# Patient Record
Sex: Male | Born: 1985
Health system: Southern US, Community
[De-identification: ages and names within clinical notes are randomized; demographics above are authoritative.]

## PROBLEM LIST (undated history)

## (undated) DIAGNOSIS — J40 Bronchitis, not specified as acute or chronic: Secondary | ICD-10-CM

## (undated) DIAGNOSIS — F419 Anxiety disorder, unspecified: Secondary | ICD-10-CM

## (undated) DIAGNOSIS — F32A Depression, unspecified: Secondary | ICD-10-CM

## (undated) DIAGNOSIS — C801 Malignant (primary) neoplasm, unspecified: Secondary | ICD-10-CM

## (undated) DIAGNOSIS — D649 Anemia, unspecified: Secondary | ICD-10-CM

## (undated) DIAGNOSIS — T7840XA Allergy, unspecified, initial encounter: Secondary | ICD-10-CM

## (undated) HISTORY — DX: Allergy, unspecified, initial encounter: T78.40XA

## (undated) HISTORY — PX: ANTERIOR CRUCIATE LIGAMENT (ACL) REVISION: SHX6707

---

## 2014-08-25 ENCOUNTER — Emergency Department (HOSPITAL_BASED_OUTPATIENT_CLINIC_OR_DEPARTMENT_OTHER)
Admission: EM | Admit: 2014-08-25 | Discharge: 2014-08-26 | Disposition: A | Discharge status: Home / Self Care | Payer: 59 | Attending: Emergency Medicine | Admitting: Emergency Medicine

## 2014-08-25 ENCOUNTER — Encounter (HOSPITAL_BASED_OUTPATIENT_CLINIC_OR_DEPARTMENT_OTHER): Payer: Self-pay | Admitting: Emergency Medicine

## 2014-08-25 ENCOUNTER — Emergency Department (HOSPITAL_BASED_OUTPATIENT_CLINIC_OR_DEPARTMENT_OTHER): Payer: 59

## 2014-08-25 DIAGNOSIS — J069 Acute upper respiratory infection, unspecified: Secondary | ICD-10-CM | POA: Diagnosis not present

## 2014-08-25 DIAGNOSIS — R0602 Shortness of breath: Secondary | ICD-10-CM | POA: Diagnosis present

## 2014-08-25 DIAGNOSIS — B9789 Other viral agents as the cause of diseases classified elsewhere: Secondary | ICD-10-CM

## 2014-08-25 NOTE — ED Notes (Signed)
Patient states that he has tightness in throat and cough x 2-3 days

## 2014-08-25 NOTE — ED Notes (Signed)
Pt in NAD Alert and oriented.

## 2014-08-26 MED ORDER — HYDROCODONE-HOMATROPINE 5-1.5 MG/5ML PO SYRP: 5.0000 mL | ORAL_SOLUTION | Freq: Four times a day (QID) | ORAL | Status: DC | PRN

## 2014-08-26 NOTE — Discharge Instructions (Signed)
Upper Respiratory Infection, Adult An upper respiratory infection (URI) is also sometimes known as the common cold. The upper respiratory tract includes the nose, sinuses, throat, trachea, and bronchi. Bronchi are the airways leading to the lungs. Most people improve within 1 week, but symptoms can last up to 2 weeks. A residual cough may last even longer.  CAUSES Many different viruses can infect the tissues lining the upper respiratory tract. The tissues become irritated and inflamed and often become very moist. Mucus production is also common. A cold is contagious. You can easily spread the virus to others by oral contact. This includes kissing, sharing a glass, coughing, or sneezing. Touching your mouth or nose and then touching a surface, which is then touched by another person, can also spread the virus. SYMPTOMS  Symptoms typically develop 1 to 3 days after you come in contact with a cold virus. Symptoms vary from person to person. They may include:  Runny nose.  Sneezing.  Nasal congestion.  Sinus irritation.  Sore throat.  Loss of voice (laryngitis).  Cough.  Fatigue.  Muscle aches.  Loss of appetite.  Headache.  Low-grade fever. DIAGNOSIS  You might diagnose your own cold based on familiar symptoms, since most people get a cold 2 to 3 times a year. Your caregiver can confirm this based on your exam. Most importantly, your caregiver can check that your symptoms are not due to another disease such as strep throat, sinusitis, pneumonia, asthma, or epiglottitis. Blood tests, throat tests, and X-rays are not necessary to diagnose a common cold, but they may sometimes be helpful in excluding other more serious diseases. Your caregiver will decide if any further tests are required. RISKS AND COMPLICATIONS  You may be at risk for a more severe case of the common cold if you smoke cigarettes, have chronic heart disease (such as heart failure) or lung disease (such as asthma), or if  you have a weakened immune system. The very young and very old are also at risk for more serious infections. Bacterial sinusitis, middle ear infections, and bacterial pneumonia can complicate the common cold. The common cold can worsen asthma and chronic obstructive pulmonary disease (COPD). Sometimes, these complications can require emergency medical care and may be life-threatening. PREVENTION  The best way to protect against getting a cold is to practice good hygiene. Avoid oral or hand contact with people with cold symptoms. Wash your hands often if contact occurs. There is no clear evidence that vitamin C, vitamin E, echinacea, or exercise reduces the chance of developing a cold. However, it is always recommended to get plenty of rest and practice good nutrition. TREATMENT  Treatment is directed at relieving symptoms. There is no cure. Antibiotics are not effective, because the infection is caused by a virus, not by bacteria. Treatment may include:  Increased fluid intake. Sports drinks offer valuable electrolytes, sugars, and fluids.  Breathing heated mist or steam (vaporizer or shower).  Eating chicken soup or other clear broths, and maintaining good nutrition.  Getting plenty of rest.  Using gargles or lozenges for comfort.  Controlling fevers with ibuprofen or acetaminophen as directed by your caregiver.  Increasing usage of your inhaler if you have asthma. Zinc gel and zinc lozenges, taken in the first 24 hours of the common cold, can shorten the duration and lessen the severity of symptoms. Pain medicines may help with fever, muscle aches, and throat pain. A variety of non-prescription medicines are available to treat congestion and runny nose. Your caregiver   can make recommendations and may suggest nasal or lung inhalers for other symptoms.  HOME CARE INSTRUCTIONS   Only take over-the-counter or prescription medicines for pain, discomfort, or fever as directed by your  caregiver.  Use a warm mist humidifier or inhale steam from a shower to increase air moisture. This may keep secretions moist and make it easier to breathe.  Drink enough water and fluids to keep your urine clear or pale yellow.  Rest as needed.  Return to work when your temperature has returned to normal or as your caregiver advises. You may need to stay home longer to avoid infecting others. You can also use a face mask and careful hand washing to prevent spread of the virus. SEEK MEDICAL CARE IF:   After the first few days, you feel you are getting worse rather than better.  You need your caregiver's advice about medicines to control symptoms.  You develop chills, worsening shortness of breath, or brown or red sputum. These may be signs of pneumonia.  You develop yellow or brown nasal discharge or pain in the face, especially when you bend forward. These may be signs of sinusitis.  You develop a fever, swollen neck glands, pain with swallowing, or white areas in the back of your throat. These may be signs of strep throat. SEEK IMMEDIATE MEDICAL CARE IF:   You have a fever.  You develop severe or persistent headache, ear pain, sinus pain, or chest pain.  You develop wheezing, a prolonged cough, cough up blood, or have a change in your usual mucus (if you have chronic lung disease).  You develop sore muscles or a stiff neck. Document Released: 11/06/2000 Document Revised: 08/05/2011 Document Reviewed: 08/18/2013 [employer] Patient Information 2015 [employer]. This information is not intended to replace advice given to you by your health care provider. Make sure you discuss any questions you have with your health care provider.  

## 2014-08-26 NOTE — ED Provider Notes (Signed)
[name]: [id number]     Arrival date & time 08/25/14  2155 History   First MD Initiated Contact with Patient 08/26/14 0016     Chief Complaint  Patient presents with  . Shortness of Breath  . Cough     (Consider location/radiation/quality/duration/timing/severity/associated sxs/prior Treatment) HPI Comments: Patient states that he has tightness in throat and cough x 2-3 days, as well as subjective fevers and chills.  He has been in and out of the hospital with his 63-year-old daughter recently and thinks that he has picked up a virus.  He denies shortness of breath, sore throat, ear pain, abdominal pain, nausea, vomiting, diarrhea, myalgias  Patient is a 29 y.o. male presenting with shortness of breath and cough.  Shortness of Breath Associated symptoms: cough   Associated symptoms: no abdominal pain, no chest pain, no fever, no headaches, no rash and no vomiting   Cough Associated symptoms: no chest pain, no fever, no headaches, no rash and no rhinorrhea     History reviewed. No pertinent past medical history. History reviewed. No pertinent past surgical history. History reviewed. No pertinent family history. History  Substance Use Topics  . Smoking status: Never Smoker   . Smokeless tobacco: Not on file  . Alcohol Use: No    Review of Systems  Constitutional: Negative for fever, activity change, appetite change and fatigue.  HENT: Negative for congestion, facial swelling, rhinorrhea and trouble swallowing.   Eyes: Negative for photophobia and pain.  Respiratory: Positive for cough and chest tightness.   Cardiovascular: Negative for chest pain and leg swelling.  Gastrointestinal: Negative for nausea, vomiting, abdominal pain, diarrhea and constipation.  Endocrine: Negative for polydipsia and polyuria.  Genitourinary: Negative for dysuria, urgency, decreased urine volume and difficulty urinating.  Musculoskeletal: Negative for back pain and gait problem.  Skin: Negative for color  change, rash and wound.  Allergic/Immunologic: Negative for immunocompromised state.  Neurological: Negative for dizziness, facial asymmetry, speech difficulty, weakness, numbness and headaches.  Psychiatric/Behavioral: Negative for confusion, decreased concentration and agitation.      Allergies  Review of patient's allergies indicates no known allergies.  Home Medications   Prior to Admission medications   Medication Sig Start Date End Date Taking? Authorizing Provider  HYDROcodone-homatropine (HYCODAN) 5-1.5 MG/5ML syrup Take 5 mLs by mouth every 6 (six) hours as needed for cough. 08/26/14   Ernestina Patches, MD   BP 121/85 mmHg  Pulse 109  Temp(Src) 99.9 F (37.7 C) (Oral)  Resp 20  Wt 248 lb (112.492 kg)  SpO2 96% Physical Exam  Constitutional: He is oriented to person, place, and time. He appears well-developed and well-nourished. No distress.  HENT:  Head: Normocephalic and atraumatic.  Mouth/Throat: No oropharyngeal exudate.  Eyes: Pupils are equal, round, and reactive to light.  Neck: Normal range of motion. Neck supple.  Cardiovascular: Normal rate, regular rhythm and normal heart sounds.  Exam reveals no gallop and no friction rub.   No murmur heard. Pulmonary/Chest: Effort normal and breath sounds normal. No respiratory distress. He has no wheezes. He has no rales.  Abdominal: Soft. Bowel sounds are normal. He exhibits no distension and no mass. There is no tenderness. There is no rebound and no guarding.  Musculoskeletal: Normal range of motion. He exhibits no edema or tenderness.  Neurological: He is alert and oriented to person, place, and time.  Skin: Skin is warm and dry.  Psychiatric: He has a normal mood and affect.    ED Course  Procedures (including critical  care time) Labs Review Labs Reviewed - No data to display  Imaging Review Dg Chest 2 View  08/25/2014   CLINICAL DATA:  Cough and fever for 1 week  EXAM: CHEST  2 VIEW  COMPARISON:  None.   FINDINGS: Bronchial wall thickening without focal opacity or pleural effusion. No edema, effusion, or pneumothorax. Normal heart size and mediastinal contours.  IMPRESSION: Bronchitic markings without focal pneumonia.   Electronically Signed   By: Monte Fantasia M.D.   On: 08/25/2014 22:52     EKG Interpretation None      MDM   Final diagnoses:  Viral URI with cough    SUBJECTIVE:  Philip Clark is a 29 y.o. male who complains of dry cough and chest tightness for 2-3 days. He denies a history of anorexia, chest pain, nausea, vomiting and wheezing and does not a history of asthma. Patient does not smoke cigarettes.   OBJECTIVE: He appears well, vital signs are as noted. Ears normal.  Throat and pharynx normal.  Neck supple. No adenopathy in the neck. Nose is congested. Sinuses non tender. The chest is clear, without wheezes or rales.  He has a low-grade temperature.  His mildly tachycardic but in no acute distress.   ASSESSMENT:  viral upper respiratory illness  PLAN: Symptomatic therapy suggested: push fluids, rest, use acetaminophen, ibuprofen prn and return office visit prn if symptoms persist or worsen. Lack of antibiotic effectiveness discussed with him. Return to ED prn if these symptoms worsen or fail to improve as anticipated.     Ernestina Patches, MD 08/26/14 [id number]

## 2014-11-23 ENCOUNTER — Emergency Department (HOSPITAL_BASED_OUTPATIENT_CLINIC_OR_DEPARTMENT_OTHER): Payer: Commercial Managed Care - HMO

## 2014-11-23 ENCOUNTER — Emergency Department (HOSPITAL_BASED_OUTPATIENT_CLINIC_OR_DEPARTMENT_OTHER)
Admission: EM | Admit: 2014-11-23 | Discharge: 2014-11-23 | Disposition: A | Discharge status: Home / Self Care | Payer: Commercial Managed Care - HMO | Attending: Emergency Medicine | Admitting: Emergency Medicine

## 2014-11-23 ENCOUNTER — Encounter (HOSPITAL_BASED_OUTPATIENT_CLINIC_OR_DEPARTMENT_OTHER): Payer: Self-pay | Admitting: Emergency Medicine

## 2014-11-23 DIAGNOSIS — Z8709 Personal history of other diseases of the respiratory system: Secondary | ICD-10-CM | POA: Insufficient documentation

## 2014-11-23 DIAGNOSIS — Y9289 Other specified places as the place of occurrence of the external cause: Secondary | ICD-10-CM | POA: Diagnosis not present

## 2014-11-23 DIAGNOSIS — Y9389 Activity, other specified: Secondary | ICD-10-CM | POA: Insufficient documentation

## 2014-11-23 DIAGNOSIS — W1789XA Other fall from one level to another, initial encounter: Secondary | ICD-10-CM | POA: Diagnosis not present

## 2014-11-23 DIAGNOSIS — Y998 Other external cause status: Secondary | ICD-10-CM | POA: Insufficient documentation

## 2014-11-23 DIAGNOSIS — M25461 Effusion, right knee: Secondary | ICD-10-CM

## 2014-11-23 DIAGNOSIS — S8991XA Unspecified injury of right lower leg, initial encounter: Secondary | ICD-10-CM | POA: Diagnosis present

## 2014-11-23 HISTORY — DX: Bronchitis, not specified as acute or chronic: J40

## 2014-11-23 NOTE — ED Notes (Signed)
np at bedside

## 2014-11-23 NOTE — ED Notes (Signed)
Pt fell from a roof at 5pm, approx 10-12 feet, landing on both feet and then rolled to the ground.  Only pain is right knee.  Pt is walking without limp.

## 2014-11-23 NOTE — ED Provider Notes (Signed)
[name]: [id number]     Arrival date & time 11/23/14  1846 History   First MD Initiated Contact with Patient 11/23/14 1854     Chief Complaint  Patient presents with  . Knee Injury     (Consider location/radiation/quality/duration/timing/severity/associated sxs/prior Treatment) HPI Comments: Pt comes in with c/o right knee pain after falling off a 12 foot roof. He states that he landed on his right leg and then rolled and he didn't have a  Loc. He was able to get up on his own and ambulate. Denies numbness, weakness or incontinence. He denies previous injury to the area.   The history is provided by the patient. No language interpreter was used.    Past Medical History  Diagnosis Date  . Bronchitis    History reviewed. No pertinent past surgical history. No family history on file. History  Substance Use Topics  . Smoking status: Never Smoker   . Smokeless tobacco: Not on file  . Alcohol Use: Yes     Comment: occ    Review of Systems  All other systems reviewed and are negative.     Allergies  Review of patient's allergies indicates no known allergies.  Home Medications   Prior to Admission medications   Medication Sig Start Date End Date Taking? Authorizing Provider  HYDROcodone-homatropine (HYCODAN) 5-1.5 MG/5ML syrup Take 5 mLs by mouth every 6 (six) hours as needed for cough. 08/26/14   Ernestina Patches, MD   BP 122/85 mmHg  Pulse 83  Temp(Src) 98.2 F (36.8 C) (Oral)  Resp 16  Ht 6\' 2"  (1.88 m)  Wt 245 lb (111.131 kg)  BMI 31.44 kg/m2  SpO2 100% Physical Exam  Constitutional: He appears well-developed and well-nourished.  HENT:  Head: Normocephalic and atraumatic.  Right Ear: External ear normal.  Left Ear: External ear normal.  Eyes: Conjunctivae and EOM are normal. Pupils are equal, round, and reactive to light.  Neck: Neck supple.  Cardiovascular: Normal rate and regular rhythm.   Pulmonary/Chest: Effort normal and breath sounds normal.  Abdominal: Soft.  Bowel sounds are normal. There is no tenderness.  Musculoskeletal:       Cervical back: Normal.       Thoracic back: Normal.       Lumbar back: Normal.  No obvious deformity or swelling noted to the left knee. Pt has full rom. Pulses intact  Nursing note and vitals reviewed.   ED Course  Procedures (including critical care time) Labs Review Labs Reviewed - No data to display  Imaging Review Dg Knee Complete 4 Views Right  11/23/2014   CLINICAL DATA:  [place] off roof today, right knee medial pain  EXAM: RIGHT KNEE - COMPLETE 4+ VIEW  COMPARISON:  The  FINDINGS: Four views of the right knee submitted. No acute fracture or subluxation. No radiopaque foreign body. Small joint effusion.  IMPRESSION: No acute fracture or subluxation.  Small joint effusion.   Electronically Signed   By: Lahoma Crocker M.D.   On: 11/23/2014 19:28     EKG Interpretation None      MDM   Final diagnoses:  Knee effusion, right    No acute bony injury noted. Pt given sleeve and follow up precautions. No loc and spine is non tender at this time    Glendell Docker, NP 11/23/14 [id number]  Serita Grit, MD 11/23/14 2355

## 2014-11-23 NOTE — Discharge Instructions (Signed)

## 2014-11-24 ENCOUNTER — Encounter: Payer: Self-pay | Admitting: Family Medicine

## 2014-11-24 ENCOUNTER — Ambulatory Visit (INDEPENDENT_AMBULATORY_CARE_PROVIDER_SITE_OTHER): Payer: Commercial Managed Care - HMO | Admitting: Family Medicine

## 2014-11-24 ENCOUNTER — Other Ambulatory Visit (INDEPENDENT_AMBULATORY_CARE_PROVIDER_SITE_OTHER): Payer: Commercial Managed Care - HMO

## 2014-11-24 VITALS — BP 108/80 | HR 81 | Ht 74.0 in | Wt 249.0 lb

## 2014-11-24 DIAGNOSIS — M25561 Pain in right knee: Secondary | ICD-10-CM

## 2014-11-24 DIAGNOSIS — S83429A Sprain of lateral collateral ligament of unspecified knee, initial encounter: Secondary | ICD-10-CM | POA: Insufficient documentation

## 2014-11-24 DIAGNOSIS — S83421A Sprain of lateral collateral ligament of right knee, initial encounter: Secondary | ICD-10-CM

## 2014-11-24 NOTE — Progress Notes (Signed)
Pre visit review using our clinic review tool, if applicable. No additional management support is needed unless otherwise documented below in the visit note. 

## 2014-11-24 NOTE — Assessment & Plan Note (Addendum)
Patient does have more of a sprain. I think that he will do fine with conservative therapy. We discussed icing regimen and patient given a 60 dose of anti-inflammatory's. Patient given home exercises with athletic trainer today. Patient will come back and see me again in 2 weeks if pain is not resolved. For work no restrictions.

## 2014-11-24 NOTE — Progress Notes (Signed)
[facility] [address] [place], [place] [address] Phone: [phone] Subjective:    I'm seeing this patient by the request  of:  No PCP Per Patient   CC: knee injury  [id number] Philip Clark is a 29 y.o. male coming in with complaint of right knee injury.  Patient yesterday was working on the roof and fell off landing on both legs. Patient landed on his feet. States that he had some very mild discomfort of the right knee and then over the course of our seem to have some swelling. Patient and make sure everything was okay. Patient went to the emergency department and was given a sleeve. X-rays were performed. These were reviewed by me and do not show any any significant bony abnormality. Patient states this morning the knee seems to be feeling a little bit better. Denies any clicking, popping or giving out on him. States that the little swelling that he had previously seems to have resolved. Overall would state that this feels near his baseline.      Past Medical History  Diagnosis Date  . Bronchitis    No past surgical history on file. History  Substance Use Topics  . Smoking status: Never Smoker   . Smokeless tobacco: Not on file  . Alcohol Use: Yes     Comment: occ   No family history on file. No Known Allergies   Past medical history, social, surgical and family history all reviewed in electronic medical record.   Review of Systems: No headache, visual changes, nausea, vomiting, diarrhea, constipation, dizziness, abdominal pain, skin rash, fevers, chills, night sweats, weight loss, swollen lymph nodes, body aches, joint swelling, muscle aches, chest pain, shortness of breath, mood changes.   Objective Blood pressure 108/80, pulse 81, height 6\' 2"  (1.88 m), weight 249 lb (112.946 kg), SpO2 97 %.  General: No apparent distress alert and oriented x3 mood and affect normal, dressed appropriately.  HEENT: Pupils equal, extraocular movements  intact  Respiratory: Patient's speak in full sentences and does not appear short of breath  Cardiovascular: No lower extremity edema, non tender, no erythema  Skin: Warm dry intact with no signs of infection or rash on extremities or on axial skeleton.  Abdomen: Soft nontender  Neuro: Cranial nerves II through XII are intact, neurovascularly intact in all extremities with 2+ DTRs and 2+ pulses.  Lymph: No lymphadenopathy of posterior or anterior cervical chain or axillae bilaterally.  Gait normal with good balance and coordination.  MSK:  Non tender with full range of motion and good stability and symmetric strength and tone of shoulders, elbows, wrist, hip, and ankles bilaterally.  Knee: Right Normal to inspection with no erythema or effusion or obvious bony abnormalities. Palpation normal with no warmth, joint line tenderness, patellar tenderness, or condyle tenderness. ROM full in flexion and extension and lower leg rotation. Ligaments with solid consistent endpoints including ACL, PCL, MCL. Very mild laxity of the LCL compared to the contralateral side Negative Mcmurray's, Apley's, and Thessalonian tests. Non painful patellar compression. Patellar glide without crepitus. Patellar and quadriceps tendons unremarkable. Hamstring and quadriceps strength is normal.  Contralateral knee unremarkable  MSK US performed of: Right knee This study was ordered, performed, and interpreted by Charlann Boxer D.O.  Knee: All structures visualized. Anteromedial, anterolateral, posteromedial, and posterolateral menisci unremarkable without tearing, fraying, effusion, or displacement. Patellar Tendon unremarkable on long and transverse views without effusion. No abnormality of prepatellar bursa. LCL does have hypoechoic changes and  increasing Doppler flow near its insertion on the fibula no true tear appreciated  MCL unremarkable on long and transverse views. No abnormality of origin of medial or lateral  head of the gastrocnemius.  IMPRESSION:  Questionable LCL strain   Procedure note [id number]; 15 minutes spent for Therapeutic exercises as stated in above notes.  This included exercises focusing on stretching, strengthening, with significant focus on eccentric aspects.   She given flexion and extension exercises as well as course strengthening hip abductor strengthening. Stabilization of the ankle important as well. Proper technique shown and discussed handout in great detail with ATC.  All questions were discussed and answered.     Impression and Recommendations:     This case required medical decision making of moderate complexity.

## 2014-11-24 NOTE — Patient Instructions (Signed)
Keep feet on ground Ice 20 minutes 2 times daily. Usually after activity and before bed. exercise Duexis 3 times a day for 6 days Continue compression for 2 weeks See me again or cal lindsay if not better in 2 weeks.

## 2015-08-06 ENCOUNTER — Other Ambulatory Visit: Payer: Self-pay

## 2015-08-06 ENCOUNTER — Emergency Department (HOSPITAL_BASED_OUTPATIENT_CLINIC_OR_DEPARTMENT_OTHER): Payer: Commercial Managed Care - HMO

## 2015-08-06 ENCOUNTER — Emergency Department (HOSPITAL_BASED_OUTPATIENT_CLINIC_OR_DEPARTMENT_OTHER)
Admission: EM | Admit: 2015-08-06 | Discharge: 2015-08-06 | Disposition: A | Discharge status: Home / Self Care | Payer: Commercial Managed Care - HMO | Attending: Emergency Medicine | Admitting: Emergency Medicine

## 2015-08-06 ENCOUNTER — Encounter (HOSPITAL_BASED_OUTPATIENT_CLINIC_OR_DEPARTMENT_OTHER): Payer: Self-pay

## 2015-08-06 DIAGNOSIS — R002 Palpitations: Secondary | ICD-10-CM | POA: Diagnosis not present

## 2015-08-06 DIAGNOSIS — R079 Chest pain, unspecified: Secondary | ICD-10-CM | POA: Insufficient documentation

## 2015-08-06 DIAGNOSIS — Z8709 Personal history of other diseases of the respiratory system: Secondary | ICD-10-CM | POA: Diagnosis not present

## 2015-08-06 LAB — BASIC METABOLIC PANEL
Anion gap: 10 (ref 5–15)
BUN: 17 mg/dL (ref 6–20)
CO2: 24 mmol/L (ref 22–32)
CREATININE: 1.17 mg/dL (ref 0.61–1.24)
Calcium: 9.3 mg/dL (ref 8.9–10.3)
Chloride: 103 mmol/L (ref 101–111)
Glucose, Bld: 130 mg/dL — ABNORMAL HIGH (ref 65–99)
Potassium: 3.6 mmol/L (ref 3.5–5.1)
Sodium: 137 mmol/L (ref 135–145)

## 2015-08-06 LAB — CBC
HCT: 41.2 % (ref 39.0–52.0)
HEMOGLOBIN: 13.9 g/dL (ref 13.0–17.0)
MCH: 29.9 pg (ref 26.0–34.0)
MCHC: 33.7 g/dL (ref 30.0–36.0)
MCV: 88.6 fL (ref 78.0–100.0)
PLATELETS: 304 10*3/uL (ref 150–400)
RBC: 4.65 MIL/uL (ref 4.22–5.81)
RDW: 13.6 % (ref 11.5–15.5)
WBC: 8.6 10*3/uL (ref 4.0–10.5)

## 2015-08-06 LAB — D-DIMER, QUANTITATIVE (NOT AT ARMC)

## 2015-08-06 LAB — TROPONIN I: Troponin I: <0.03 ng/mL (ref <0.031)

## 2015-08-06 NOTE — ED Provider Notes (Signed)
[name]: [id number]     Arrival date & time 08/06/15  1112 History   First MD Initiated Contact with Patient 08/06/15 1132     Chief Complaint  Patient presents with  . 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 male presenting with chest pain.  Chest Pain Associated symptoms: palpitations   Associated symptoms: no abdominal pain, no back pain, no diaphoresis, no fever, no headache, no nausea, no shortness of breath and not vomiting     Past Medical History  Diagnosis Date  . Bronchitis    History reviewed. No pertinent past surgical history. No family history on file. Social History  Substance Use Topics  . Smoking status: Never Smoker   . Smokeless tobacco: None  . Alcohol Use: Yes     Comment: occ    Review of Systems  Constitutional: Negative for fever and diaphoresis.  HENT: Negative for congestion and sore throat.   Eyes: Negative for visual disturbance.  Respiratory: Negative for shortness of breath.   Cardiovascular: Positive for chest pain and palpitations. Negative for leg swelling.  Gastrointestinal: Negative for nausea, vomiting and abdominal pain.  Genitourinary: Negative for difficulty urinating.  Musculoskeletal: Negative for back pain and neck stiffness.  Skin: Negative for rash.  Neurological: Positive for light-headedness. Negative for syncope and  headaches.      Allergies  Review of patient's allergies indicates no known allergies.  Home Medications   Prior to Admission medications   Not on File   BP 116/73 mmHg  Pulse 82  Temp(Src) 98.5 F (36.9 C) (Oral)  Resp 16  Ht 6\' 2"  (1.88 m)  Wt 240 lb (108.863 kg)  BMI 30.80 kg/m2  SpO2 95% Physical Exam  Constitutional: He is oriented to person, place, and time. He appears well-developed and well-nourished. No distress.  HENT:  Head: Normocephalic and atraumatic.  Eyes: Conjunctivae and EOM are normal.  Neck: Normal range of motion.  Cardiovascular: Normal rate, regular rhythm, normal heart sounds and intact distal pulses.  Exam reveals no gallop and no friction rub.   No murmur heard. Pulmonary/Chest: Effort normal and breath sounds normal. No respiratory distress. He has no wheezes. He has no rales.  Abdominal: Soft. He exhibits no distension. There is no tenderness. There is no guarding.  Musculoskeletal: He exhibits no edema.  Neurological: He is alert and oriented to person, place, and time.  Skin: Skin is warm and dry. He is not diaphoretic.  Nursing note and vitals reviewed.   ED Course  Procedures (including critical care time) Labs Review Labs Reviewed  BASIC METABOLIC PANEL - Abnormal; Notable for the following:    Glucose, Bld 130 (*)    All other components within normal limits  CBC  TROPONIN I  D-DIMER, QUANTITATIVE (NOT AT [facility])    Imaging Review Dg Chest 2 View  08/06/2015  CLINICAL DATA:  Chest tightness, palpitations EXAM: CHEST  2 VIEW COMPARISON:  08/25/2014 FINDINGS: Lungs are  clear.  No pleural effusion or pneumothorax. The heart is normal in size. Mild left perihilar prominence is likely vascular. Visualized osseous structures are within normal limits. IMPRESSION: No evidence of acute cardiopulmonary disease. Electronically Signed   By: Julian Hy M.D.   On: 08/06/2015 11:36   I have personally reviewed and evaluated these images  and lab results as part of my medical decision-making.   EKG Interpretation   Date/Time:  Sunday August 06 2015 11:22:35 EDT Ventricular Rate:  98 PR Interval:  152 QRS Duration: 84 QT Interval:  334 QTC Calculation: 426 R Axis:   47 Text Interpretation:  Normal sinus rhythm Normal ECG No previous ECGs  available Confirmed by [facility] MD, Jediah Horger ([id number]) on 08/06/2015 10:05:18  PM      MDM   Final diagnoses:  Chest pain, unspecified chest pain type  Palpitations   30 yo male with no significant medical history presents with concern for chest tightness and palpitations.  EKG shows no sign of arrhythmia or ST changes.  DDimer negative and pt low risk [place].  Patient low risk HEAR score with single troponin negative after greater than 6hr of constant chest discomfort. Offered second troponin, which pt and wife decline and feel this is reasonable given duration of symptoms.  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  Feel symptoms likely secondary go side effects of caffeine and subsequent sinus tachycardia. Recommend PCP follow up within one week, avoidance of caffeine. Patient discharged in stable condition with understanding of reasons to return.       Gareth Morgan, MD 08/06/15 2214

## 2015-08-06 NOTE — ED Notes (Signed)
Patient transported to X-ray. Pt able to ambulate with no assistance, seemingly with no problem.

## 2015-08-06 NOTE — ED Notes (Signed)
ECG done and to EDP for reveiew

## 2015-08-06 NOTE — ED Notes (Signed)
States had onset of chest pressure and fast HR last pm, returns today with cont c/o chest pressure

## 2015-08-06 NOTE — ED Notes (Signed)
Patient worked out last pm with some running and developed chest tightness and palpitations that lasted 2-3 hours. Reports that the tightness is still lingering after resting last pm, no associated symptoms

## 2015-09-22 ENCOUNTER — Ambulatory Visit (INDEPENDENT_AMBULATORY_CARE_PROVIDER_SITE_OTHER): Payer: Self-pay | Admitting: Urgent Care

## 2015-09-22 VITALS — BP 120/70 | HR 89 | Temp 98.7°F | Resp 17 | Ht 72.5 in | Wt 237.0 lb

## 2015-09-22 DIAGNOSIS — Z025 Encounter for examination for participation in sport: Secondary | ICD-10-CM

## 2015-09-22 DIAGNOSIS — Z0000 Encounter for general adult medical examination without abnormal findings: Secondary | ICD-10-CM

## 2015-09-22 NOTE — Progress Notes (Signed)
    MRN: [id number] DOB: 1986-03-04  Subjective:   Philip Clark is a 30 y.o. 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 Reports that he is doing very well. He is married, has good relationships at home. Denies smoking cigarettes or drinking alcohol.   Philip Clark currently has no medications in their medication list. Also has No Known Allergies.  Philip Clark  has a past medical history of Bronchitis and Allergy. Also  has no past surgical history on file.  Family history is negative for heart disease, cancer, diabetes.  Review of Systems  Constitutional: Negative for fever, chills, weight loss, malaise/fatigue and diaphoresis.  HENT: Negative for congestion, ear discharge, ear pain, hearing loss, nosebleeds, sore throat and tinnitus.   Eyes: Negative for blurred vision, double vision, photophobia, pain, discharge and redness.  Respiratory: Negative for cough, shortness of breath and wheezing.   Cardiovascular: Negative for chest pain, palpitations and leg swelling.  Gastrointestinal: Negative for nausea, vomiting, abdominal pain, diarrhea, constipation and blood in stool.  Genitourinary: Negative for dysuria, urgency, frequency, hematuria and flank pain.  Musculoskeletal: Negative for myalgias, back pain and joint pain.  Skin: Negative for itching and rash.  Neurological: Negative for dizziness, tingling, seizures, loss of consciousness, weakness and headaches.  Endo/Heme/Allergies: Negative for polydipsia.  Psychiatric/Behavioral: Negative for depression, suicidal ideas, hallucinations, memory loss and substance abuse. The patient is not nervous/anxious and does not have insomnia.    Objective:   Vitals: BP 120/70 mmHg  Pulse 89  Temp(Src) 98.7 F (37.1 C) (Oral)  Resp 17  Ht 6' 0.5" (1.842 m)  Wt 237 lb (107.502 kg)  BMI 31.68 kg/m2  SpO2 99%  Physical Exam  Constitutional: He  is oriented to person, place, and time. He appears well-developed and well-nourished.  HENT:  TM's intact bilaterally, no effusions or erythema. Nasal turbinates pink and moist, nasal passages patent. No sinus tenderness. Oropharynx clear, mucous membranes moist, dentition in good repair.  Eyes: Conjunctivae and EOM are normal. Pupils are equal, round, and reactive to light. Right eye exhibits no discharge. Left eye exhibits no discharge. No scleral icterus.  Neck: Normal range of motion. Neck supple. No thyromegaly present.  Cardiovascular: Normal rate, regular rhythm and intact distal pulses.  Exam reveals no gallop and no friction rub.   No murmur heard. Pulmonary/Chest: No stridor. No respiratory distress. He has no wheezes. He has no rales.  Abdominal: Soft. Bowel sounds are normal. He exhibits no distension and no mass. There is no tenderness.  Musculoskeletal: Normal range of motion. He exhibits no edema or tenderness.  Strength 5/5. Full ROM.  Lymphadenopathy:    He has no cervical adenopathy.  Neurological: He is alert and oriented to person, place, and time. He has normal reflexes.  Skin: Skin is warm and dry. No rash noted. No erythema. No pallor.  Psychiatric: He has a normal mood and affect.   Assessment and Plan :   1. 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 Forms completed and sent for scanning.  Jaynee Eagles, PA-C [facility] [phone] 09/22/2015 6:51 PM

## 2015-09-22 NOTE — Patient Instructions (Addendum)
Keeping you healthy  Get these tests  Blood pressure- Have your blood pressure checked once a year by your healthcare provider.  Normal blood pressure is 120/80.  Weight- Have your body mass index (BMI) calculated to screen for obesity.  BMI is a measure of body fat based on height and weight. You can also calculate your own BMI at GravelBags.it.  Cholesterol- Have your cholesterol checked regularly starting at age 30, sooner may be necessary if you have diabetes, high blood pressure, if a family member developed heart diseases at an early age or if you smoke.   Chlamydia, HIV, and other sexual transmitted disease- Get screened each year until the age of 67 then within three months of each new sexual partner.  Diabetes- Have your blood sugar checked regularly if you have high blood pressure, high cholesterol, a family history of diabetes or if you are overweight.  Get these vaccines  Flu shot- Every fall.  Tetanus shot- Every 10 years.  Menactra- Single dose; prevents meningitis.  Take these steps  Don't smoke- If you do smoke, ask your healthcare provider about quitting. For tips on how to quit, go to www.smokefree.gov or call 1-800-QUIT-NOW.  Be physically active- Exercise 5 days a week for at least 30 minutes.  If you are not already physically active start slow and gradually work up to 30 minutes of moderate physical activity.  Examples of moderate activity include walking briskly, mowing the yard, dancing, swimming bicycling, etc.  Eat a healthy diet- Eat a variety of healthy foods such as fruits, vegetables, low fat milk, low fat cheese, yogurt, lean meats, poultry, fish, beans, tofu, etc.  For more information on healthy eating, go to www.thenutritionsource.org  Drink alcohol in moderation- Limit alcohol intake two drinks or less a day.  Never drink and drive.  Dentist- Brush and floss teeth twice daily; visit your dentis twice a year.  Depression-Your emotional  health is as important as your physical health.  If you're feeling down, losing interest in things you normally enjoy please talk with your healthcare provider.  Gun Safety- If you keep a gun in your home, keep it unloaded and with the safety lock on.  Bullets should be stored separately.  Helmet use- Always wear a helmet when riding a motorcycle, bicycle, rollerblading or skateboarding.  Safe sex- If you may be exposed to a sexually transmitted infection, use a condom  Seat belts- Seat bels can save your life; always wear one.  Smoke/Carbon Monoxide detectors- These detectors need to be installed on the appropriate level of your home.  Replace batteries at least once a year.  Skin Cancer- When out in the sun, cover up and use sunscreen SPF 15 or higher.  Violence- If anyone is threatening or hurting you, please tell your healthcare provider.    IF you received an x-ray today, you will receive an invoice from [facility]. Please contact [facility] at [phone] with questions or concerns regarding your invoice.   IF you received labwork today, you will receive an invoice from [employer]. Please contact [facility] at [phone] with questions or concerns regarding your invoice.   Our billing staff will not be able to assist you with questions regarding bills from these companies.  You will be contacted with the lab results as soon as they are available. The fastest way to get your results is to activate your My Chart account. Instructions are located on the last page of this paperwork. If you have  not heard from us regarding the results in 2 weeks, please contact this office.      

## 2016-03-25 ENCOUNTER — Ambulatory Visit (INDEPENDENT_AMBULATORY_CARE_PROVIDER_SITE_OTHER): Payer: Self-pay | Admitting: Family Medicine

## 2016-03-25 VITALS — BP 104/80 | HR 93 | Temp 98.6°F | Resp 17 | Ht 72.5 in | Wt 233.0 lb

## 2016-03-25 DIAGNOSIS — Z021 Encounter for pre-employment examination: Secondary | ICD-10-CM

## 2016-03-25 NOTE — Progress Notes (Signed)
   Patient ID: Philip Clark, male    DOB: 04-07-1986, 30 y.o.   MRN: [id number]  PCP: No PCP Per Patient  Chief Complaint  Patient presents with  . Annual Exam    Admin physical    Subjective:   HPI 30 year old male presents for a pre-employment administrative only physical.  He has brought the forms to be completed with him to the visit. Applying for a new position with the [employer] department. 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 Patient reports that he doesn't take any medication. Reviewed all of the questions on the forms to verify that patient reports physical ability to perform duties specified in which he confirms his abilities.   He reports overall excellent health. He is a nonsmoker, doesn't regularly drink alcohol.  Denies any other acute problems or concerns.  Review of Systems HPI   Patient Active Problem List   Diagnosis Date Noted  . Knee LCL sprain 11/24/2014     Prior to Admission medications   Not on File   No Known Allergies     Objective:  Physical Exam  Constitutional: He is oriented to person, place, and time. He appears well-developed and well-nourished.  HENT:  Head: Normocephalic and atraumatic.  Right Ear: External ear normal.  Left Ear: External ear normal.  Nose: Nose normal.  Mouth/Throat: Oropharynx is clear and moist.  Eyes: Conjunctivae and EOM are normal. Pupils are equal, round, and reactive to light.  Neck: Normal range of motion. Neck supple.  Cardiovascular: Normal rate, regular rhythm, normal heart sounds and intact distal pulses.   Pulmonary/Chest: Effort normal and breath sounds normal.  Abdominal: Soft. Bowel sounds are normal.  Musculoskeletal: Normal range of motion.  Neurological: He is alert and oriented to person, place, and time.  Skin: Skin is warm.  Psychiatric: He has a normal mood and affect. His behavior is normal. Thought content normal.   Vitals:    03/25/16 1631  BP: 104/80  Pulse: 93  Resp: 17  Temp: 98.6 F (37 C)     Assessment & Plan:  1. Pre-employment health screening examination -Paperwork completed and scanned to patients EMR.  Follow-up as needed.  Carroll Sage. Kenton Kingfisher, MSN, FNP-C [facility]

## 2016-03-25 NOTE — Patient Instructions (Addendum)
Nice meeting you!  Philip Clark. Kenton Kingfisher, MSN, FNP-C [facility]  IF you received an x-ray today, you will receive an invoice from [facility]. Please contact [facility] at [phone] with questions or concerns regarding your invoice.   IF you received labwork today, you will receive an invoice from [employer]. Please contact [facility] at [phone] with questions or concerns regarding your invoice.   Our billing staff will not be able to assist you with questions regarding bills from these companies.  You will be contacted with the lab results as soon as they are available. The fastest way to get your results is to activate your My Chart account. Instructions are located on the last page of this paperwork. If you have not heard from [place] regarding the results in 2 weeks, please contact this office.     Exercising to Stay Healthy Exercising regularly is important. It has many health benefits, such as:  Improving your overall fitness, flexibility, and endurance.  Increasing your bone density.  Helping with weight control.  Decreasing your body fat.  Increasing your muscle strength.  Reducing stress and tension.  Improving your overall health. In order to become healthy and stay healthy, it is recommended that you do moderate-intensity and vigorous-intensity exercise. You can tell that you are exercising at a moderate intensity if you have a higher heart rate and faster breathing, but you are still able to hold a conversation. You can tell that you are exercising at a vigorous intensity if you are breathing much harder and faster and cannot hold a conversation while exercising. HOW OFTEN SHOULD I EXERCISE? Choose an activity that you enjoy and set realistic goals. Your health care provider can help you to make an activity plan that works for you. Exercise regularly as directed by your health care  provider. This may include:   Doing resistance training twice each week, such as:  Push-ups.  Sit-ups.  Lifting weights.  Using resistance bands.  Doing a given intensity of exercise for a given amount of time. Choose from these options:  150 minutes of moderate-intensity exercise every week.  75 minutes of vigorous-intensity exercise every week.  A mix of moderate-intensity and vigorous-intensity exercise every week. Children, pregnant women, people who are out of shape, people who are overweight, and older adults may need to consult a health care provider for individual recommendations. If you have any sort of medical condition, be sure to consult your health care provider before starting a new exercise program.  WHAT ARE SOME EXERCISE IDEAS? Some moderate-intensity exercise ideas include:   Walking at a rate of 1 mile in 15 minutes.  Biking.  Hiking.  Golfing.  Dancing. Some vigorous-intensity exercise ideas include:   Walking at a rate of at least 4.5 miles per hour.  Jogging or running at a rate of 5 miles per hour.  Biking at a rate of at least 10 miles per hour.  Lap swimming.  Roller-skating or in-line skating.  Cross-country skiing.  Vigorous competitive sports, such as football, basketball, and soccer.  Jumping rope.  Aerobic dancing. WHAT ARE SOME EVERYDAY ACTIVITIES THAT CAN HELP ME TO GET EXERCISE?  Yard work, such as:  Psychologist, educational.  Raking and bagging leaves.  Washing and waxing your car.  Pushing a stroller.  Shoveling snow.  Gardening.  Washing windows or floors. HOW CAN I BE MORE ACTIVE IN MY DAY-TO-DAY ACTIVITIES?  Use the stairs instead  of the elevator.  Take a walk during your lunch break.  If you drive, park your car farther away from work or school.  If you take public transportation, get off one stop early and walk the rest of the way.  Make all of your phone calls while standing up and walking  around.  Get up, stretch, and walk around every 30 minutes throughout the day. WHAT GUIDELINES SHOULD I FOLLOW WHILE EXERCISING?  Do not exercise so much that you hurt yourself, feel dizzy, or get very short of breath.  Consult your health care provider before starting a new exercise program.  Wear comfortable clothes and shoes with good support.  Drink plenty of water while you exercise to prevent dehydration or heat stroke. Body water is lost during exercise and must be replaced.  Work out until you breathe faster and your heart beats faster.   This information is not intended to replace advice given to you by your health care provider. Make sure you discuss any questions you have with your health care provider.   Document Released: 06/15/2010 Document Revised: 06/03/2014 Document Reviewed: 10/14/2013 Elsevier Interactive Patient Education [employer].

## 2016-05-17 ENCOUNTER — Other Ambulatory Visit: Payer: Self-pay | Admitting: Orthopedic Surgery

## 2016-05-17 DIAGNOSIS — R531 Weakness: Secondary | ICD-10-CM

## 2016-05-17 DIAGNOSIS — R52 Pain, unspecified: Secondary | ICD-10-CM

## 2016-05-22 ENCOUNTER — Ambulatory Visit
Admission: RE | Admit: 2016-05-22 | Discharge: 2016-05-22 | Disposition: A | Discharge status: Home / Self Care | Payer: Commercial Managed Care - HMO | Source: Ambulatory Visit | Attending: Orthopedic Surgery | Admitting: Orthopedic Surgery

## 2016-05-22 DIAGNOSIS — R52 Pain, unspecified: Secondary | ICD-10-CM

## 2016-05-22 DIAGNOSIS — R531 Weakness: Secondary | ICD-10-CM

## 2016-05-28 DIAGNOSIS — S8992XA Unspecified injury of left lower leg, initial encounter: Secondary | ICD-10-CM | POA: Insufficient documentation

## 2016-07-16 DIAGNOSIS — Z9889 Other specified postprocedural states: Secondary | ICD-10-CM | POA: Insufficient documentation

## 2018-06-22 DIAGNOSIS — J101 Influenza due to other identified influenza virus with other respiratory manifestations: Secondary | ICD-10-CM | POA: Diagnosis not present

## 2018-11-21 DIAGNOSIS — R1010 Upper abdominal pain, unspecified: Secondary | ICD-10-CM | POA: Diagnosis not present

## 2018-11-30 DIAGNOSIS — S0921XA Traumatic rupture of right ear drum, initial encounter: Secondary | ICD-10-CM | POA: Diagnosis not present

## 2019-04-17 ENCOUNTER — Other Ambulatory Visit: Payer: Self-pay

## 2019-04-17 DIAGNOSIS — Z20822 Contact with and (suspected) exposure to covid-19: Secondary | ICD-10-CM

## 2019-04-19 LAB — NOVEL CORONAVIRUS, NAA: SARS-CoV-2, NAA: NOT DETECTED

## 2019-06-11 DIAGNOSIS — R0789 Other chest pain: Secondary | ICD-10-CM | POA: Diagnosis not present

## 2019-06-11 DIAGNOSIS — R1010 Upper abdominal pain, unspecified: Secondary | ICD-10-CM | POA: Diagnosis not present

## 2019-07-26 DIAGNOSIS — Z03818 Encounter for observation for suspected exposure to other biological agents ruled out: Secondary | ICD-10-CM | POA: Diagnosis not present

## 2019-08-04 DIAGNOSIS — Z03818 Encounter for observation for suspected exposure to other biological agents ruled out: Secondary | ICD-10-CM | POA: Diagnosis not present

## 2019-10-06 DIAGNOSIS — Z3009 Encounter for other general counseling and advice on contraception: Secondary | ICD-10-CM | POA: Diagnosis not present

## 2020-01-06 DIAGNOSIS — K645 Perianal venous thrombosis: Secondary | ICD-10-CM | POA: Diagnosis not present

## 2020-01-18 DIAGNOSIS — K645 Perianal venous thrombosis: Secondary | ICD-10-CM | POA: Diagnosis not present

## 2020-01-26 ENCOUNTER — Other Ambulatory Visit: Payer: Self-pay

## 2020-01-27 ENCOUNTER — Ambulatory Visit: Payer: BC Managed Care – PPO | Admitting: Family Medicine

## 2020-01-27 ENCOUNTER — Encounter: Payer: Self-pay | Admitting: Family Medicine

## 2020-01-27 VITALS — BP 130/80 | HR 87 | Temp 98.6°F | Ht 72.0 in | Wt 237.6 lb

## 2020-01-27 DIAGNOSIS — Z0000 Encounter for general adult medical examination without abnormal findings: Secondary | ICD-10-CM | POA: Diagnosis not present

## 2020-01-27 NOTE — Patient Instructions (Signed)
Health Maintenance, Male Adopting a healthy lifestyle and getting preventive care are important in promoting health and wellness. Ask your health care provider about:  The right schedule for you to have regular tests and exams.  Things you can do on your own to prevent diseases and keep yourself healthy. What should I know about diet, weight, and exercise? Eat a healthy diet   Eat a diet that includes plenty of vegetables, fruits, low-fat dairy products, and lean protein.  Do not eat a lot of foods that are high in solid fats, added sugars, or sodium. Maintain a healthy weight Body mass index (BMI) is a measurement that can be used to identify possible weight problems. It estimates body fat based on height and weight. Your health care provider can help determine your BMI and help you achieve or maintain a healthy weight. Get regular exercise Get regular exercise. This is one of the most important things you can do for your health. Most adults should:  Exercise for at least 150 minutes each week. The exercise should increase your heart rate and make you sweat (moderate-intensity exercise).  Do strengthening exercises at least twice a week. This is in addition to the moderate-intensity exercise.  Spend less time sitting. Even light physical activity can be beneficial. Watch cholesterol and blood lipids Have your blood tested for lipids and cholesterol at 34 years of age, then have this test every 5 years. You may need to have your cholesterol levels checked more often if:  Your lipid or cholesterol levels are high.  You are older than 34 years of age.  You are at high risk for heart disease. What should I know about cancer screening? Many types of cancers can be detected early and may often be prevented. Depending on your health history and family history, you may need to have cancer screening at various ages. This may include screening for:  Colorectal cancer.  Prostate  cancer.  Skin cancer.  Lung cancer. What should I know about heart disease, diabetes, and high blood pressure? Blood pressure and heart disease  High blood pressure causes heart disease and increases the risk of stroke. This is more likely to develop in people who have high blood pressure readings, are of African descent, or are overweight.  Talk with your health care provider about your target blood pressure readings.  Have your blood pressure checked: ? Every 3-5 years if you are 18-39 years of age. ? Every year if you are 40 years old or older.  If you are between the ages of 65 and 75 and are a current or former smoker, ask your health care provider if you should have a one-time screening for abdominal aortic aneurysm (AAA). Diabetes Have regular diabetes screenings. This checks your fasting blood sugar level. Have the screening done:  Once every three years after age 45 if you are at a normal weight and have a low risk for diabetes.  More often and at a younger age if you are overweight or have a high risk for diabetes. What should I know about preventing infection? Hepatitis B If you have a higher risk for hepatitis B, you should be screened for this virus. Talk with your health care provider to find out if you are at risk for hepatitis B infection. Hepatitis C Blood testing is recommended for:  Everyone born from 1945 through 1965.  Anyone with known risk factors for hepatitis C. Sexually transmitted infections (STIs)  You should be screened each year   for STIs, including gonorrhea and chlamydia, if: ? You are sexually active and are younger than 34 years of age. ? You are older than 34 years of age and your health care provider tells you that you are at risk for this type of infection. ? Your sexual activity has changed since you were last screened, and you are at increased risk for chlamydia or gonorrhea. Ask your health care provider if you are at risk.  Ask your  health care provider about whether you are at high risk for HIV. Your health care provider may recommend a prescription medicine to help prevent HIV infection. If you choose to take medicine to prevent HIV, you should first get tested for HIV. You should then be tested every 3 months for as long as you are taking the medicine. Follow these instructions at home: Lifestyle  Do not use any products that contain nicotine or tobacco, such as cigarettes, e-cigarettes, and chewing tobacco. If you need help quitting, ask your health care provider.  Do not use street drugs.  Do not share needles.  Ask your health care provider for help if you need support or information about quitting drugs. Alcohol use  Do not drink alcohol if your health care provider tells you not to drink.  If you drink alcohol: ? Limit how much you have to 0-2 drinks a day. ? Be aware of how much alcohol is in your drink. In the [place] one drink equals one 12 oz bottle of beer (355 mL), one 5 oz glass of wine (148 mL), or one 1 oz glass of hard liquor (44 mL). General instructions  Schedule regular health, dental, and eye exams.  Stay current with your vaccines.  Tell your health care provider if: ? You often feel depressed. ? You have ever been abused or do not feel safe at home. Summary  Adopting a healthy lifestyle and getting preventive care are important in promoting health and wellness.  Follow your health care provider's instructions about healthy diet, exercising, and getting tested or screened for diseases.  Follow your health care provider's instructions on monitoring your cholesterol and blood pressure. This information is not intended to replace advice given to you by your health care provider. Make sure you discuss any questions you have with your health care provider. Document Revised: 05/06/2018 Document Reviewed: 05/06/2018 Elsevier Patient Education  2020 Elsevier Inc.  Preventive Care 21-39 Years  Old, Male Preventive care refers to lifestyle choices and visits with your health care provider that can promote health and wellness. This includes:  A yearly physical exam. This is also called an annual well check.  Regular dental and eye exams.  Immunizations.  Screening for certain conditions.  Healthy lifestyle choices, such as eating a healthy diet, getting regular exercise, not using drugs or products that contain nicotine and tobacco, and limiting alcohol use. What can I expect for my preventive care visit? Physical exam Your health care provider will check:  Height and weight. These may be used to calculate body mass index (BMI), which is a measurement that tells if you are at a healthy weight.  Heart rate and blood pressure.  Your skin for abnormal spots. Counseling Your health care provider may ask you questions about:  Alcohol, tobacco, and drug use.  Emotional well-being.  Home and relationship well-being.  Sexual activity.  Eating habits.  Work and work environment. What immunizations do I need?  Influenza (flu) vaccine  This is recommended every year. Tetanus, diphtheria,   and pertussis (Tdap) vaccine  You may need a Td booster every 10 years. Varicella (chickenpox) vaccine  You may need this vaccine if you have not already been vaccinated. Human papillomavirus (HPV) vaccine  If recommended by your health care provider, you may need three doses over 6 months. Measles, mumps, and rubella (MMR) vaccine  You may need at least one dose of MMR. You may also need a second dose. Meningococcal conjugate (MenACWY) vaccine  One dose is recommended if you are 73-28 years old and a Market researcher living in a residence hall, or if you have one of several medical conditions. You may also need additional booster doses. Pneumococcal conjugate (PCV13) vaccine  You may need this if you have certain conditions and were not previously  vaccinated. Pneumococcal polysaccharide (PPSV23) vaccine  You may need one or two doses if you smoke cigarettes or if you have certain conditions. Hepatitis A vaccine  You may need this if you have certain conditions or if you travel or work in places where you may be exposed to hepatitis A. Hepatitis B vaccine  You may need this if you have certain conditions or if you travel or work in places where you may be exposed to hepatitis B. Haemophilus influenzae type b (Hib) vaccine  You may need this if you have certain risk factors. You may receive vaccines as individual doses or as more than one vaccine together in one shot (combination vaccines). Talk with your health care provider about the risks and benefits of combination vaccines. What tests do I need? Blood tests  Lipid and cholesterol levels. These may be checked every 5 years starting at age 80.  Hepatitis C test.  Hepatitis B test. Screening   Diabetes screening. This is done by checking your blood sugar (glucose) after you have not eaten for a while (fasting).  Sexually transmitted disease (STD) testing. Talk with your health care provider about your test results, treatment options, and if necessary, the need for more tests. Follow these instructions at home: Eating and drinking   Eat a diet that includes fresh fruits and vegetables, whole grains, lean protein, and low-fat dairy products.  Take vitamin and mineral supplements as recommended by your health care provider.  Do not drink alcohol if your health care provider tells you not to drink.  If you drink alcohol: ? Limit how much you have to 0-2 drinks a day. ? Be aware of how much alcohol is in your drink. In the [place] one drink equals one 12 oz bottle of beer (355 mL), one 5 oz glass of wine (148 mL), or one 1 oz glass of hard liquor (44 mL). Lifestyle  Take daily care of your teeth and gums.  Stay active. Exercise for at least 30 minutes on 5 or more days  each week.  Do not use any products that contain nicotine or tobacco, such as cigarettes, e-cigarettes, and chewing tobacco. If you need help quitting, ask your health care provider.  If you are sexually active, practice safe sex. Use a condom or other form of protection to prevent STIs (sexually transmitted infections). What's next?  Go to your health care provider once a year for a well check visit.  Ask your health care provider how often you should have your eyes and teeth checked.  Stay up to date on all vaccines. This information is not intended to replace advice given to you by your health care provider. Make sure you discuss any questions you  have with your health care provider. Document Revised: 05/07/2018 Document Reviewed: 05/07/2018 Elsevier Patient Education  2020 Elsevier Inc.  

## 2020-01-27 NOTE — Addendum Note (Signed)
Addended by: Lynnea Ferrier on: 01/27/2020 04:24 PM   Modules accepted: Orders

## 2020-01-27 NOTE — Progress Notes (Signed)
New Patient Office Visit  Subjective:  Patient ID: Philip Clark, male    DOB: 08-18-1985  Age: 34 y.o. MRN: [id number]  CC:  Chief Complaint  Patient presents with  . Establish Care    New patient, no concerns     HPI Philip Clark presents for a physical exam.  He is in good health as far as he knows.  He does not smoke or use illicit drugs.  He drinks alcohol on occasion.  He is married.  He is a Airline pilot.  Dad has a history of coronary artery disease.  Mom is status post cholecystectomy.  Patient is healthy as far as he knows.  Patient lives with his wife and daughter.  Ongoing stress in the home the daughter who seemed to develop a seizure disorder after minor head trauma.  Seizures are now under control but she appears to have attention deficit disorder.  Patient's wife is a Marine scientist.  He has not had the Covid vaccine.  Past Medical History:  Diagnosis Date  . Allergy   . Bronchitis     Past Surgical History:  Procedure Laterality Date  . ANTERIOR CRUCIATE LIGAMENT (ACL) REVISION Left     Family History  Problem Relation Age of Onset  . Healthy Mother   . Healthy Father   . Dementia Maternal Grandmother     Social History   Socioeconomic History  . Marital status: Married    Spouse name: Not on file  . Number of children: Not on file  . Years of education: Not on file  . Highest education level: Not on file  Occupational History  . Not on file  Tobacco Use  . Smoking status: Never Smoker  . Smokeless tobacco: Never Used  Vaping Use  . Vaping Use: Never used  Substance and Sexual Activity  . Alcohol use: Yes    Alcohol/week: 1.0 standard drink    Types: 1 Cans of beer per week    Comment: occ  . Drug use: No  . Sexual activity: Yes  Other Topics Concern  . Not on file  Social History Narrative  . Not on file   Social Determinants of Health   Financial Resource Strain:   . Difficulty of Paying Living Expenses: Not on file  Food Insecurity:   .  Worried About Charity fundraiser in the Last Year: Not on file  . Ran Out of Food in the Last Year: Not on file  Transportation Needs:   . Lack of Transportation (Medical): Not on file  . Lack of Transportation (Non-Medical): Not on file  Physical Activity:   . Days of Exercise per Week: Not on file  . Minutes of Exercise per Session: Not on file  Stress:   . Feeling of Stress : Not on file  Social Connections:   . Frequency of Communication with Friends and Family: Not on file  . Frequency of Social Gatherings with Friends and Family: Not on file  . Attends Religious Services: Not on file  . Active Member of Clubs or Organizations: Not on file  . Attends Archivist Meetings: Not on file  . Marital Status: Not on file  Intimate Partner Violence:   . Fear of Current or Ex-Partner: Not on file  . Emotionally Abused: Not on file  . Physically Abused: Not on file  . Sexually Abused: Not on file    ROS Review of Systems  Constitutional: Negative.   HENT: Negative.   Eyes:  Negative for photophobia and visual disturbance.  Respiratory: Negative.   Cardiovascular: Negative.   Gastrointestinal: Negative.   Endocrine: Negative for polyphagia.  Genitourinary: Negative.   Musculoskeletal: Negative for joint swelling.  Allergic/Immunologic: Negative for immunocompromised state.  Neurological: Negative.   Hematological: Negative.    Depression screen [facility] 2/9 01/27/2020 03/25/2016 09/22/2015  Decreased Interest 1 0 0  Down, Depressed, Hopeless 1 0 0  PHQ - 2 Score 2 0 0    Objective:   Today's Vitals: BP 130/80   Pulse 87   Temp 98.6 F (37 C) (Tympanic)   Ht 6' (1.829 m)   Wt 237 lb 9.6 oz (107.8 kg)   SpO2 98%   BMI 32.22 kg/m   Physical Exam  Assessment & Plan:   Problem List Items Addressed This Visit    None    Visit Diagnoses    Healthcare maintenance    -  Primary   Relevant Orders   CBC   Comprehensive metabolic panel   LDL cholesterol, direct    Lipid panel   TSH   Urinalysis, Routine w reflex microscopic      No outpatient encounter medications on file as of 01/27/2020.   No facility-administered encounter medications on file as of 01/27/2020.    Follow-up: Return in about 1 year (around 01/26/2021).   Given information on health maintenance and disease prevention.  Encouraged the Covid vaccine with Nolvadex may be.  He is aware of his increased risk with testicular cancer and lymphoma.  Discussed talking therapy for stress.  He will consider.  Libby Maw, MD

## 2020-02-01 ENCOUNTER — Other Ambulatory Visit: Payer: Self-pay

## 2020-02-01 ENCOUNTER — Other Ambulatory Visit (INDEPENDENT_AMBULATORY_CARE_PROVIDER_SITE_OTHER): Payer: BC Managed Care – PPO

## 2020-02-01 DIAGNOSIS — Z0000 Encounter for general adult medical examination without abnormal findings: Secondary | ICD-10-CM

## 2020-02-01 LAB — COMPREHENSIVE METABOLIC PANEL
ALT: 25 U/L (ref 0–53)
AST: 32 U/L (ref 0–37)
Albumin: 4.5 g/dL (ref 3.5–5.2)
Alkaline Phosphatase: 52 U/L (ref 39–117)
BUN: 14 mg/dL (ref 6–23)
CO2: 28 mEq/L (ref 19–32)
Calcium: 9.4 mg/dL (ref 8.4–10.5)
Chloride: 104 mEq/L (ref 96–112)
Creatinine, Ser: 1.05 mg/dL (ref 0.40–1.50)
GFR: 80.74 mL/min (ref >60.00)
Glucose, Bld: 84 mg/dL (ref 70–99)
Potassium: 4.5 mEq/L (ref 3.5–5.1)
Sodium: 139 mEq/L (ref 135–145)
Total Bilirubin: 0.9 mg/dL (ref 0.2–1.2)
Total Protein: 7.5 g/dL (ref 6.0–8.3)

## 2020-02-01 LAB — URINALYSIS, ROUTINE W REFLEX MICROSCOPIC
Bilirubin Urine: NEGATIVE
Hgb urine dipstick: NEGATIVE
Ketones, ur: NEGATIVE
Leukocytes,Ua: NEGATIVE
Nitrite: NEGATIVE
RBC / HPF: NONE SEEN (ref 0–?)
Specific Gravity, Urine: 1.01 (ref 1.000–1.030)
Total Protein, Urine: NEGATIVE
Urine Glucose: NEGATIVE
Urobilinogen, UA: 0.2 (ref 0.0–1.0)
WBC, UA: NONE SEEN (ref 0–?)
pH: 6.5 (ref 5.0–8.0)

## 2020-02-01 LAB — CBC
HCT: 40.5 % (ref 39.0–52.0)
Hemoglobin: 13.5 g/dL (ref 13.0–17.0)
MCHC: 33.4 g/dL (ref 30.0–36.0)
MCV: 88.3 fl (ref 78.0–100.0)
Platelets: 283 10*3/uL (ref 150.0–400.0)
RBC: 4.59 Mil/uL (ref 4.22–5.81)
RDW: 14 % (ref 11.5–15.5)
WBC: 7.7 10*3/uL (ref 4.0–10.5)

## 2020-02-01 LAB — LIPID PANEL
Cholesterol: 155 mg/dL (ref 0–200)
HDL: 40.9 mg/dL (ref >39.00)
LDL Cholesterol: 100 mg/dL — ABNORMAL HIGH (ref 0–99)
NonHDL: 114.26
Total CHOL/HDL Ratio: 4
Triglycerides: 73 mg/dL (ref 0.0–149.0)
VLDL: 14.6 mg/dL (ref 0.0–40.0)

## 2020-02-01 LAB — TSH: TSH: 2.3 u[IU]/mL (ref 0.35–4.50)

## 2020-02-01 LAB — LDL CHOLESTEROL, DIRECT: Direct LDL: 109 mg/dL

## 2020-03-10 DIAGNOSIS — R0789 Other chest pain: Secondary | ICD-10-CM | POA: Diagnosis not present

## 2020-03-13 ENCOUNTER — Other Ambulatory Visit: Payer: Self-pay

## 2020-03-13 ENCOUNTER — Ambulatory Visit: Payer: BC Managed Care – PPO | Admitting: Nurse Practitioner

## 2020-03-13 ENCOUNTER — Encounter: Payer: Self-pay | Admitting: Nurse Practitioner

## 2020-03-13 VITALS — BP 124/80 | HR 77 | Temp 97.5°F | Ht 73.0 in | Wt 244.4 lb

## 2020-03-13 DIAGNOSIS — F4322 Adjustment disorder with anxiety: Secondary | ICD-10-CM | POA: Diagnosis not present

## 2020-03-13 MED ORDER — ALPRAZOLAM 0.5 MG PO TABS: 0.2500 mg | ORAL_TABLET | Freq: Two times a day (BID) | ORAL | 0 refills | Status: DC | PRN

## 2020-03-13 NOTE — Patient Instructions (Addendum)
Use Headspace app for medications and relaxation methods Schedule appt with therapist   Managing Anxiety, Adult After being diagnosed with an anxiety disorder, you may be relieved to know why you have felt or behaved a certain way. You may also feel overwhelmed about the treatment ahead and what it will mean for your life. With care and support, you can manage this condition and recover from it. How to manage lifestyle changes Managing stress and anxiety  Stress is your body's reaction to life changes and events, both good and bad. Most stress will last just a few hours, but stress can be ongoing and can lead to more than just stress. Although stress can play a major role in anxiety, it is not the same as anxiety. Stress is usually caused by something external, such as a deadline, test, or competition. Stress normally passes after the triggering event has ended.  Anxiety is caused by something internal, such as imagining a terrible outcome or worrying that something will go wrong that will devastate you. Anxiety often does not go away even after the triggering event is over, and it can become long-term (chronic) worry. It is important to understand the differences between stress and anxiety and to manage your stress effectively so that it does not lead to an anxious response. Talk with your health care provider or a counselor to learn more about reducing anxiety and stress. He or she may suggest tension reduction techniques, such as:  Music therapy. This can include creating or listening to music that you enjoy and that inspires you.  Mindfulness-based meditation. This involves being aware of your normal breaths while not trying to control your breathing. It can be done while sitting or walking.  Centering prayer. This involves focusing on a word, phrase, or sacred image that means something to you and brings you peace.  Deep breathing. To do this, expand your stomach and inhale slowly through  your nose. Hold your breath for 3-5 seconds. Then exhale slowly, letting your stomach muscles relax.  Self-talk. This involves identifying thought patterns that lead to anxiety reactions and changing those patterns.  Muscle relaxation. This involves tensing muscles and then relaxing them. Choose a tension reduction technique that suits your lifestyle and personality. These techniques take time and practice. Set aside 5-15 minutes a day to do them. Therapists can offer counseling and training in these techniques. The training to help with anxiety may be covered by some insurance plans. Other things you can do to manage stress and anxiety include:  Keeping a stress/anxiety diary. This can help you learn what triggers your reaction and then learn ways to manage your response.  Thinking about how you react to certain situations. You may not be able to control everything, but you can control your response.  Making time for activities that help you relax and not feeling guilty about spending your time in this way.  Visual imagery and yoga can help you stay calm and relax.  Medicines Medicines can help ease symptoms. Medicines for anxiety include:  Anti-anxiety drugs.  Antidepressants. Medicines are often used as a primary treatment for anxiety disorder. Medicines will be prescribed by a health care provider. When used together, medicines, psychotherapy, and tension reduction techniques may be the most effective treatment. Relationships Relationships can play a big part in helping you recover. Try to spend more time connecting with trusted friends and family members. Consider going to couples counseling, taking family education classes, or going to family therapy. Therapy can  help you and others better understand your condition. How to recognize changes in your anxiety Everyone responds differently to treatment for anxiety. Recovery from anxiety happens when symptoms decrease and stop interfering  with your daily activities at home or work. This may mean that you will start to:  Have better concentration and focus. Worry will interfere less in your daily thinking.  Sleep better.  Be less irritable.  Have more energy.  Have improved memory. It is important to recognize when your condition is getting worse. Contact your health care provider if your symptoms interfere with home or work and you feel like your condition is not improving. Follow these instructions at home: Activity  Exercise. Most adults should do the following: ? Exercise for at least 150 minutes each week. The exercise should increase your heart rate and make you sweat (moderate-intensity exercise). ? Strengthening exercises at least twice a week.  Get the right amount and quality of sleep. Most adults need 7-9 hours of sleep each night. Lifestyle   Eat a healthy diet that includes plenty of vegetables, fruits, whole grains, low-fat dairy products, and lean protein. Do not eat a lot of foods that are high in solid fats, added sugars, or salt.  Make choices that simplify your life.  Do not use any products that contain nicotine or tobacco, such as cigarettes, e-cigarettes, and chewing tobacco. If you need help quitting, ask your health care provider.  Avoid caffeine, alcohol, and certain over-the-counter cold medicines. These may make you feel worse. Ask your pharmacist which medicines to avoid. General instructions  Take over-the-counter and prescription medicines only as told by your health care provider.  Keep all follow-up visits as told by your health care provider. This is important. Where to find support You can get help and support from these sources:  Self-help groups.  Online and [employer].  A trusted spiritual leader.  Couples counseling.  Family education classes.  Family therapy. Where to find more information You may find that joining a support group helps you deal with  your anxiety. The following sources can help you locate counselors or support groups near you:  [place]: www.mentalhealth[place].net  Anxiety and Depression Association of [place] (ADAA): https://www.clark.net/  National Alliance on Mental Illness (NAMI): www.nami.org Contact a health care provider if you:  Have a hard time staying focused or finishing daily tasks.  Spend many hours a day feeling worried about everyday life.  Become exhausted by worry.  Start to have headaches, feel tense, or have nausea.  Urinate more than normal.  Have diarrhea. Get help right away if you have:  A racing heart and shortness of breath.  Thoughts of hurting yourself or others. If you ever feel like you may hurt yourself or others, or have thoughts about taking your own life, get help right away. You can go to your nearest emergency department or call:  Your local emergency services (911 in the [place]  A suicide crisis helpline, such as the [place] at (220)877-5386. This is open 24 hours a day. Summary  Taking steps to learn and use tension reduction techniques can help calm you and help prevent triggering an anxiety reaction.  When used together, medicines, psychotherapy, and tension reduction techniques may be the most effective treatment.  Family, friends, and partners can play a big part in helping you recover from an anxiety disorder. This information is not intended to replace advice given to you by your health care provider. Make  sure you discuss any questions you have with your health care provider. Document Revised: 10/13/2018 Document Reviewed: 10/13/2018 Elsevier Patient Education  [place].

## 2020-03-13 NOTE — Progress Notes (Signed)
Subjective:  Patient ID: Philip Clark, male    DOB: 10/02/1985  Age: 34 y.o. MRN: [id number]  CC: Acute Visit (Pt c/o of severe anxiety since Wednesday of last week. Pt states he thought he was having heart issues due to the severity of his anxiety.)  Anxiety Presents for initial visit. Onset was 1 to 6 months ago. The problem has been waxing and waning. Symptoms include chest pain, depressed mood, dizziness, dry mouth, excessive worry, hyperventilation, muscle tension, nervous/anxious behavior, palpitations, panic, restlessness and shortness of breath. Patient reports no compulsions, confusion, decreased concentration, feeling of choking, impotence, insomnia, irritability, malaise, nausea, obsessions or suicidal ideas. Symptoms occur most days. The severity of symptoms is causing significant distress. The symptoms are aggravated by family issues (concerns about 34yrs old daugther's health). The quality of sleep is fair.   Risk factors include family history. His past medical history is significant for anxiety/panic attacks. There is no history of anemia, arrhythmia, asthma, bipolar disorder, CAD, CHF, chronic lung disease, depression, fibromyalgia, hyperthyroidism or suicide attempts. Past treatments include nothing.  47yrs old daughter fell off her bed, hit forehead, fall did not trigger a seizure but he felt very anxious with the anticipation of another seizure. Her last head injury and seizure activity was 02/2020. Her first episode led to hospitalization and intubation x 2days. Reports he has been so anxious and unable to be away from home. He planned [place] go to the mountains yesterday, but felt increasingly anxious as he drove further away from home. Symptoms improved when he turn around and returned home. 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 He had EMS eval on  03/11/2020. I reviewed copy of 12lead ECG and compared to previous ECG 07/2015: no change and no abnormal finding. caffeine intake 1cup/day occasisonal ETOH, no tobacco use No illicit durg use Depression screen Philip Clark 2/9 03/13/2020 01/27/2020 01/27/2020  Decreased Interest 2 1 1   Down, Depressed, Hopeless 1 0 1  PHQ - 2 Score 3 1 2   Altered sleeping 1 0 -  Tired, decreased energy 1 1 -  Change in appetite - 0 -  Feeling bad or failure about yourself  1 0 -  Trouble concentrating 1 0 -  Moving slowly or fidgety/restless 0 0 -  Suicidal thoughts 0 0 -  PHQ-9 Score 7 2 -  Difficult doing work/chores Somewhat difficult Somewhat difficult -   GAD 7 : Generalized Anxiety Score 03/13/2020 01/27/2020  Nervous, Anxious, on Edge 1 1  Control/stop worrying 1 0  Worry too much - different things 1 1  Trouble relaxing 1 0  Restless 1 0  Easily annoyed or irritable 1 1  Afraid - awful might happen 1 0  Total GAD 7 Score 7 3  Anxiety Difficulty Somewhat difficult Somewhat difficult   Reviewed past Medical, Social and Family history today.  No outpatient medications prior to visit.   No facility-administered medications prior to visit.    ROS See HPI  Objective:  BP 124/80 (BP Location: Left Arm, Patient Position: Sitting, Cuff Size: Normal)   Pulse 77   Temp (!) 97.5 F (36.4 C) (Temporal)   Ht 6\' 1"  (1.854 m)   Wt 244 lb 6.4 oz (110.9 kg)   SpO2 99%   BMI 32.24 kg/m   Physical Exam Vitals reviewed.  Cardiovascular:     Rate and Rhythm:  Normal rate and regular rhythm.     Pulses: Normal pulses.     Heart sounds: Normal heart sounds.  Pulmonary:     Effort: Pulmonary effort is normal.     Breath sounds: Normal breath sounds.  Neurological:     Mental Status: He is alert and oriented to person, place, and time.  Psychiatric:        Attention and Perception: Attention normal.        Mood and Affect: Mood is anxious.        Speech: Speech normal.        Behavior: Behavior is  cooperative.        Thought Content: Thought content normal.        Cognition and Memory: Cognition normal.        Judgment: Judgment normal.     Assessment & Plan:  This visit occurred during the SARS-CoV-2 public health emergency.  Safety protocols were in place, including screening questions prior to the visit, additional usage of staff PPE, and extensive cleaning of exam room while observing appropriate contact time as indicated for disinfecting solutions.   Philip Clark was seen today for acute visit.  Diagnoses and all orders for this visit:  Adjustment disorder with anxious mood -     ALPRAZolam (XANAX) 0.5 MG tablet; Take 0.5-1 tablets (0.25-0.5 mg total) by mouth 2 (two) times daily as needed for anxiety. -     Ambulatory referral to Psychology   Problem List Items Addressed This Visit    None    Visit Diagnoses    Adjustment disorder with anxious mood    -  Primary   Relevant Medications   ALPRAZolam (XANAX) 0.5 MG tablet   Other Relevant Orders   Ambulatory referral to Psychology      I have spent 65mins with this patient regarding history taking, documentation, review of ECG, formulating plan and discussing treatment options with patient.  Follow-up: Return in about 4 weeks (around 04/10/2020) for Anxiety with Dr. Ethelene Clark.  Philip Lacy, NP

## 2020-03-16 DIAGNOSIS — F411 Generalized anxiety disorder: Secondary | ICD-10-CM | POA: Diagnosis not present

## 2020-03-16 DIAGNOSIS — F410 Panic disorder [episodic paroxysmal anxiety] without agoraphobia: Secondary | ICD-10-CM | POA: Diagnosis not present

## 2020-03-17 ENCOUNTER — Other Ambulatory Visit: Payer: Self-pay

## 2020-03-17 ENCOUNTER — Emergency Department (HOSPITAL_BASED_OUTPATIENT_CLINIC_OR_DEPARTMENT_OTHER): Payer: BC Managed Care – PPO

## 2020-03-17 ENCOUNTER — Emergency Department (HOSPITAL_BASED_OUTPATIENT_CLINIC_OR_DEPARTMENT_OTHER)
Admission: EM | Admit: 2020-03-17 | Discharge: 2020-03-17 | Disposition: A | Discharge status: Home / Self Care | Payer: BC Managed Care – PPO | Attending: Emergency Medicine | Admitting: Emergency Medicine

## 2020-03-17 ENCOUNTER — Encounter (HOSPITAL_BASED_OUTPATIENT_CLINIC_OR_DEPARTMENT_OTHER): Payer: Self-pay | Admitting: Emergency Medicine

## 2020-03-17 DIAGNOSIS — R0602 Shortness of breath: Secondary | ICD-10-CM | POA: Insufficient documentation

## 2020-03-17 DIAGNOSIS — R0789 Other chest pain: Secondary | ICD-10-CM | POA: Insufficient documentation

## 2020-03-17 DIAGNOSIS — R079 Chest pain, unspecified: Secondary | ICD-10-CM | POA: Diagnosis not present

## 2020-03-17 HISTORY — DX: Anxiety disorder, unspecified: F41.9

## 2020-03-17 LAB — CBC
HCT: 41.4 % (ref 39.0–52.0)
Hemoglobin: 13.7 g/dL (ref 13.0–17.0)
MCH: 29.2 pg (ref 26.0–34.0)
MCHC: 33.1 g/dL (ref 30.0–36.0)
MCV: 88.3 fL (ref 80.0–100.0)
Platelets: 305 10*3/uL (ref 150–400)
RBC: 4.69 MIL/uL (ref 4.22–5.81)
RDW: 13.2 % (ref 11.5–15.5)
WBC: 7.7 10*3/uL (ref 4.0–10.5)
nRBC: 0 % (ref 0.0–0.2)

## 2020-03-17 LAB — I-STAT CHEM 8, ED
BUN: 14 mg/dL (ref 6–20)
Calcium, Ion: 1.27 mmol/L (ref 1.15–1.40)
Chloride: 102 mmol/L (ref 98–111)
Creatinine, Ser: 1.2 mg/dL (ref 0.61–1.24)
Glucose, Bld: 96 mg/dL (ref 70–99)
HCT: 41 % (ref 39.0–52.0)
Hemoglobin: 13.9 g/dL (ref 13.0–17.0)
Potassium: 4 mmol/L (ref 3.5–5.1)
Sodium: 141 mmol/L (ref 135–145)
TCO2: 26 mmol/L (ref 22–32)

## 2020-03-17 LAB — BASIC METABOLIC PANEL
Anion gap: 9 (ref 5–15)
BUN: 15 mg/dL (ref 6–20)
CO2: 26 mmol/L (ref 22–32)
Calcium: 9.2 mg/dL (ref 8.9–10.3)
Chloride: 104 mmol/L (ref 98–111)
Creatinine, Ser: 1.12 mg/dL (ref 0.61–1.24)
GFR, Estimated: >60 mL/min (ref >60)
Glucose, Bld: 105 mg/dL — ABNORMAL HIGH (ref 70–99)
Potassium: 4.1 mmol/L (ref 3.5–5.1)
Sodium: 139 mmol/L (ref 135–145)

## 2020-03-17 LAB — TROPONIN I (HIGH SENSITIVITY): Troponin I (High Sensitivity): <2 ng/L (ref <18)

## 2020-03-17 MED ORDER — HYDROXYZINE HCL 25 MG PO TABS
25.0000 mg | ORAL_TABLET | Freq: Four times a day (QID) | ORAL | 0 refills | Status: DC | PRN
Start: 2020-03-17 — End: 2020-10-18

## 2020-03-17 NOTE — Discharge Instructions (Addendum)
Follow up with cardiology due to family history of cardiac events as discussed. Return to the ER for worsening or concerning symptoms.  Stop the Xanax, take Atarax as needed as prescribed. This medication may make you tired, do not work or drive until you know how it will effect you.

## 2020-03-17 NOTE — ED Provider Notes (Signed)
[facility] EMERGENCY DEPARTMENT Provider Note   [name]: [id number] Arrival date & time: 03/17/20  1140     History Chief Complaint  Patient presents with  . Chest Pain    Philip Clark is a 34 y.o. male.  34 year old male presents with complaint of chest pain and shortness of breath, onset 1 week ago. States his daughter was previously intubated following a seizure, daughter fell off her bed and hit her head, did not have another seizure but he has been worried about this. Patient went to his PCP who gave him a short course of xanax with referral to psych, feels like the xanax doesn't really help. Pain is located right and left chest, lasts for about 10-15 minutes and resolves. No pain with exertion, works as a Agricultural consultant, has not been limited in his job due to the pain. Reports strong family cardiac history in his father and paternal grandfather with cardiac events in their 26s. Patient is a non smoker, denies history of HTN (although states BP was elevated last week), hyperlipidemia, diabetes. No other complaints or concerns. Recently passed annual exercise stress test at work.      HPI: A 34 year old patient with a history of obesity presents for evaluation of chest pain. Initial onset of pain was more than 6 hours ago. The patient's chest pain is described as heaviness/pressure/tightness and is not worse with exertion. The patient's chest pain is not middle- or left-sided, is not well-localized, is not sharp and does not radiate to the arms/jaw/neck. The patient does not complain of nausea and denies diaphoresis. The patient has no history of stroke, has no history of peripheral artery disease, has not smoked in the past 90 days, denies any history of treated diabetes, has no relevant family history of coronary artery disease (first degree relative at less than age 15), is not hypertensive and has no history of hypercholesterolemia.   Past Medical History:  Diagnosis Date  .  Allergy   . Anxiety   . Bronchitis     Patient Active Problem List   Diagnosis Date Noted  . Knee LCL sprain 11/24/2014    Past Surgical History:  Procedure Laterality Date  . ANTERIOR CRUCIATE LIGAMENT (ACL) REVISION Left        Family History  Problem Relation Age of Onset  . Anxiety disorder Mother   . Heart disease Father 5       MI x 2  . Dementia Maternal Grandmother   . Heart disease Maternal Grandfather 49       CAD with CABG  . Early death Paternal Grandfather 40       CAD/MI    Social History   Tobacco Use  . Smoking status: Never Smoker  . Smokeless tobacco: Never Used  Vaping Use  . Vaping Use: Never used  Substance Use Topics  . Alcohol use: Yes    Alcohol/week: 1.0 standard drink    Types: 1 Cans of beer per week    Comment: occ  . Drug use: No    Home Medications Prior to Admission medications   Medication Sig Start Date End Date Taking? Authorizing Provider  ALPRAZolam Duanne Moron) 0.5 MG tablet Take 0.5-1 tablets (0.25-0.5 mg total) by mouth 2 (two) times daily as needed for anxiety. 03/13/20   Nche, Charlene Brooke, NP  hydrOXYzine (ATARAX/VISTARIL) 25 MG tablet Take 1 tablet (25 mg total) by mouth every 6 (six) hours as needed for anxiety. 03/17/20   Tacy Learn, PA-C  Allergies    Patient has no known allergies.  Review of Systems   Review of Systems  Constitutional: Negative for fever.  Respiratory: Positive for shortness of breath.   Cardiovascular: Positive for chest pain.  Gastrointestinal: Negative for abdominal pain, nausea and vomiting.  Genitourinary: Negative for difficulty urinating.  Musculoskeletal: Positive for back pain. Negative for neck pain and neck stiffness.  Skin: Negative for rash and wound.  Allergic/Immunologic: Negative for immunocompromised state.  Neurological: Negative for weakness.  All other systems reviewed and are negative.   Physical Exam Updated Vital Signs BP 111/83 (BP Location: Left Arm)    Pulse 67   Temp 98 F (36.7 C) (Oral)   Resp 12   Ht 6\' 1"  (1.854 m)   Wt 108.9 kg   SpO2 96%   BMI 31.66 kg/m   Physical Exam Vitals and nursing note reviewed.  Constitutional:      General: He is not in acute distress.    Appearance: He is well-developed. He is not diaphoretic.  HENT:     Head: Normocephalic and atraumatic.  Cardiovascular:     Rate and Rhythm: Normal rate and regular rhythm.     Heart sounds: Normal heart sounds. No murmur heard.   Pulmonary:     Effort: Pulmonary effort is normal.     Breath sounds: Normal breath sounds.  Chest:     Chest wall: No tenderness.  Abdominal:     Palpations: Abdomen is soft.     Tenderness: There is no abdominal tenderness.  Musculoskeletal:     Cervical back: Neck supple.     Right lower leg: No tenderness. No edema.     Left lower leg: No tenderness. No edema.  Skin:    General: Skin is warm and dry.     Findings: No erythema or rash.  Neurological:     Mental Status: He is alert and oriented to person, place, and time.  Psychiatric:        Behavior: Behavior normal.     ED Results / Procedures / Treatments   Labs (all labs ordered are listed, but only abnormal results are displayed) Labs Reviewed  CBC  BASIC METABOLIC PANEL  I-STAT CHEM 8, ED  TROPONIN I (HIGH SENSITIVITY)    EKG EKG Interpretation  Date/Time:  Friday March 17 2020 11:48:14 EDT Ventricular Rate:  85 PR Interval:    QRS Duration: 90 QT Interval:  356 QTC Calculation: 424 R Axis:   41 Text Interpretation: Sinus rhythm Confirmed by Quintella Reichert [phone]) on 03/17/2020 12:01:16 PM   Radiology DG Chest 2 View  Result Date: 03/17/2020 CLINICAL DATA:  Chest pain and pressure, some shortness of breath EXAM: CHEST - 2 VIEW COMPARISON:  08/06/2015 FINDINGS: Normal heart size, mediastinal contours, and pulmonary vascularity. Lungs clear. No infiltrate, pleural effusion or pneumothorax. Osseous structures unremarkable. IMPRESSION: No  acute abnormalities. Electronically Signed   By: Lavonia Dana M.D.   On: 03/17/2020 12:29    Procedures Procedures (including critical care time)  Medications Ordered in ED Medications - No data to display  ED Course  I have reviewed the triage vital signs and the nursing notes.  Pertinent labs & imaging results that were available during my care of the patient were reviewed by me and considered in my medical decision making (see chart for details).  Clinical Course as of Mar 17 1349  Fri Mar 17, 8044  8159 77-year-old male with complaint of chest pain or shortness of breath for  the past week as above. PCP records reviewed from visit this week. On exam patient is well-appearing, in no distress. There is no chest wall tenderness, no musculoskeletal tenderness, DP pulses present and equal in both feet. Labs reviewed and reassuring including normal CBC, Chem-8 and troponin. EKG without ischemic changes, chest x-ray unremarkable. Recommend discontinuing the Xanax, will try Atarax. Will refer to cardiology due to strong family history. Advised to return to ER for worsening or concerning symptoms.   [LM]    Clinical Course User Index [LM] Roque Lias   MDM Rules/Calculators/A&P HEAR Score: 1                        Final Clinical Impression(s) / ED Diagnoses Final diagnoses:  Atypical chest pain    Rx / DC Orders ED Discharge Orders         Ordered    hydrOXYzine (ATARAX/VISTARIL) 25 MG tablet  Every 6 hours PRN        03/17/20 1322           Roque Lias 03/17/20 1350    Quintella Reichert, MD 03/17/20 [phone]

## 2020-03-17 NOTE — ED Triage Notes (Signed)
Cp x 1 week saw his dr on 18 and was given xanax for anxiety pt states  Is cont pressure that goes from left to rt and then goes to middle , some sob off and off , states has been under stress due to daughter having a sz

## 2020-03-27 ENCOUNTER — Encounter: Payer: Self-pay | Admitting: Nurse Practitioner

## 2020-03-27 ENCOUNTER — Telehealth: Payer: Self-pay | Admitting: Family Medicine

## 2020-03-27 NOTE — Telephone Encounter (Signed)
Patient calling for note to return to work. Per patient he was seen on 03/13/20. Patient is just returning back to work today 03/27/20 and calling for a note. Okay for note? Please advise.

## 2020-03-27 NOTE — Telephone Encounter (Signed)
patient notified that note will only be for the day of OV.  Letter printed and placed up front for pick up.  Dm/cma

## 2020-03-27 NOTE — Telephone Encounter (Signed)
Note can be provided for date of OV only.

## 2020-03-27 NOTE — Telephone Encounter (Signed)
Patient called and stated that he saw [facility] on 10/18 and wanted to see if he can get a work note and if he could pick it up today, please advise. CB is [phone]

## 2020-03-28 ENCOUNTER — Other Ambulatory Visit: Payer: Self-pay

## 2020-03-28 ENCOUNTER — Encounter: Payer: Self-pay | Admitting: Cardiology

## 2020-03-28 ENCOUNTER — Ambulatory Visit: Payer: BC Managed Care – PPO | Admitting: Cardiology

## 2020-03-28 VITALS — BP 140/68 | HR 75 | Ht 73.0 in | Wt 247.0 lb

## 2020-03-28 DIAGNOSIS — E669 Obesity, unspecified: Secondary | ICD-10-CM | POA: Diagnosis not present

## 2020-03-28 DIAGNOSIS — R030 Elevated blood-pressure reading, without diagnosis of hypertension: Secondary | ICD-10-CM

## 2020-03-28 DIAGNOSIS — R079 Chest pain, unspecified: Secondary | ICD-10-CM

## 2020-03-28 MED ORDER — METOPROLOL TARTRATE 100 MG PO TABS: ORAL_TABLET | ORAL | 0 refills | Status: DC

## 2020-03-28 NOTE — Progress Notes (Signed)
Cardiology Office Note:    Date:  03/28/2020   ID:  Evangeline Gula Rasmus, DOB 1985/06/16, MRN [id number]  PCP:  Libby Maw, MD  Cardiologist:  Berniece Salines, DO  Electrophysiologist:  None   Referring MD: Libby Maw,*   " I had chest pain recently"  History of Present Illness:    Grier Vu is a 34 y.o. male with a hx of anxiety, elevated blood pressure who presented to the emergency department at [facility] on October 22 to be evaluated for chest pain.  Patient tells me that for a week prior to his presentation he had been experiencing midsternal chest discomfort.  He thought it was anxiety disorder.  During that.  He notes that he was experiencing great deal of stress given the fact that his daughter was just intubated for effective seizure disorder.  With his chest discomfort he had no shortness of breath, no lightheadedness or dizziness.  Of note the patient tells me that he does have a significant family history with MI in his father in his an early age, his grandfather died at age 100 from an MI and also his maternal grandfather died of MI.  He works for the [employer] and reports that they get serial EKGs and has not been told this was ever abnormal and also runs on a treadmill.  He also tells me that he suspect that he has hypertension as his blood pressure has been elevated as well.  Past Medical History:  Diagnosis Date   Allergy    Anxiety    Bronchitis     Past Surgical History:  Procedure Laterality Date   ANTERIOR CRUCIATE LIGAMENT (ACL) REVISION Left     Current Medications: Current Meds  Medication Sig   ALPRAZolam (XANAX) 0.5 MG tablet Take 0.5-1 tablets (0.25-0.5 mg total) by mouth 2 (two) times daily as needed for anxiety.   hydrOXYzine (ATARAX/VISTARIL) 25 MG tablet Take 1 tablet (25 mg total) by mouth every 6 (six) hours as needed for anxiety.     Allergies:   Patient has no known allergies.   Social History    Socioeconomic History   Marital status: Married    Spouse name: Not on file   Number of children: Not on file   Years of education: Not on file   Highest education level: Not on file  Occupational History   Not on file  Tobacco Use   Smoking status: Never Smoker   Smokeless tobacco: Never Used  Vaping Use   Vaping Use: Never used  Substance and Sexual Activity   Alcohol use: Yes    Alcohol/week: 1.0 standard drink    Types: 1 Cans of beer per week    Comment: occ   Drug use: No   Sexual activity: Yes  Other Topics Concern   Not on file  Social History Narrative   Not on file   Social Determinants of Health   Financial Resource Strain:    Difficulty of Paying Living Expenses: Not on file  Food Insecurity:    Worried About [place] in the Last Year: Not on file   Ran Out of Food in the Last Year: Not on file  Transportation Needs:    Lack of Transportation (Medical): Not on file   Lack of Transportation (Non-Medical): Not on file  Physical Activity:    Days of Exercise per Week: Not on file   Minutes of Exercise per Session: Not on file  Stress:  Feeling of Stress : Not on file  Social Connections:    Frequency of Communication with Friends and Family: Not on file   Frequency of Social Gatherings with Friends and Family: Not on file   Attends Religious Services: Not on file   Active Member of Clubs or Organizations: Not on file   Attends Banker Meetings: Not on file   Marital Status: Not on file     Family History: The patient's family history includes Anxiety disorder in his mother; Dementia in his maternal grandmother; Early death (age of onset: 75) in his paternal grandfather; Heart disease (age of onset: 28) in his father; Heart disease (age of onset: 65) in his maternal grandfather.  ROS:   Review of Systems  Constitution: Negative for decreased appetite, fever and weight gain.  HENT: Negative for  congestion, ear discharge, hoarse voice and sore throat.   Eyes: Negative for discharge, redness, vision loss in right eye and visual halos.  Cardiovascular: Reports chest pain. Negative dyspnea on exertion, leg swelling, orthopnea and palpitations.  Respiratory: Negative for cough, hemoptysis, shortness of breath and snoring.   Endocrine: Negative for heat intolerance and polyphagia.  Hematologic/Lymphatic: Negative for bleeding problem. Does not bruise/bleed easily.  Skin: Negative for flushing, nail changes, rash and suspicious lesions.  Musculoskeletal: Negative for arthritis, joint pain, muscle cramps, myalgias, neck pain and stiffness.  Gastrointestinal: Negative for abdominal pain, bowel incontinence, diarrhea and excessive appetite.  Genitourinary: Negative for decreased libido, genital sores and incomplete emptying.  Neurological: Negative for brief paralysis, focal weakness, headaches and loss of balance.  Psychiatric/Behavioral: Negative for altered mental status, depression and suicidal ideas.  Allergic/Immunologic: Negative for HIV exposure and persistent infections.    EKGs/Labs/Other Studies Reviewed:    The following studies were reviewed today:   EKG:  The ekg on March 21, 2020 demonstrates sinus rhythm, heart rate 5 bpm.  Recent Labs: 02/01/2020: ALT 25; TSH 2.30 03/17/2020: BUN 14; Creatinine, Ser 1.20; Hemoglobin 13.9; Platelets 305; Potassium 4.0; Sodium 141  Recent Lipid Panel    Component Value Date/Time   CHOL 155 02/01/2020 0800   TRIG 73.0 02/01/2020 0800   HDL 40.90 02/01/2020 0800   CHOLHDL 4 02/01/2020 0800   VLDL 14.6 02/01/2020 0800   LDLCALC 100 (H) 02/01/2020 0800   LDLDIRECT 109.0 02/01/2020 0800    Physical Exam:    VS:  BP 140/68 (BP Location: Right Arm)    Pulse 75    Ht 6\' 1"  (1.854 m)    Wt 247 lb (112 kg)    SpO2 100%    BMI 32.59 kg/m     Wt Readings from Last 3 Encounters:  03/28/20 247 lb (112 kg)  03/17/20 240 lb (108.9 kg)    03/13/20 244 lb 6.4 oz (110.9 kg)     GEN: Well nourished, well developed in no acute distress HEENT: Normal NECK: No JVD; No carotid bruits LYMPHATICS: No lymphadenopathy CARDIAC: S1S2 noted,RRR, no murmurs, rubs, gallops RESPIRATORY:  Clear to auscultation without rales, wheezing or rhonchi  ABDOMEN: Soft, non-tender, non-distended, +bowel sounds, no guarding. EXTREMITIES: No edema, No cyanosis, no clubbing MUSCULOSKELETAL:  No deformity  SKIN: Warm and dry NEUROLOGIC:  Alert and oriented x 3, non-focal PSYCHIATRIC:  Normal affect, good insight  ASSESSMENT:    1. Chest pain of uncertain etiology   2. Elevated BP without diagnosis of hypertension   3. Obesity (BMI 30-39.9)    PLAN:     His chest pain is concerning given his  risk factor I like to get an ischemic evaluation in this patient.  He has no IV contrast dye allergy.  I have educated patient about the testing he is agreeable to proceed.  We will therefore proceed with a coronary CTA.  In addition he has diastolic hypertension office today.  He reports that his blood pressure at home with his diastolic also be elevated.  I am going to asked the patient take his blood pressure daily and we will monitor this and see if he needs to be started on any antihypertensive medication.  In the meantime given his chest pain I would like to rule out any structural abnormality especially wall thickness as well as any valvular abnormalities therefore an echocardiogram will be performed at this time. The patient understands the need to lose weight with diet and exercise. We have discussed specific strategies for this.  The patient is in agreement with the above plan. The patient left the office in stable condition.  The patient will follow up in 3 months or sooner if needed.   Medication Adjustments/Labs and Tests Ordered: Current medicines are reviewed at length with the patient today.  Concerns regarding medicines are outlined above.   Orders Placed This Encounter  Procedures   CT CORONARY MORPH W/CTA COR W/SCORE W/CA W/CM &/OR WO/CM   CT CORONARY FRACTIONAL FLOW RESERVE DATA PREP   CT CORONARY FRACTIONAL FLOW RESERVE FLUID ANALYSIS   ECHOCARDIOGRAM COMPLETE   Meds ordered this encounter  Medications   metoprolol tartrate (LOPRESSOR) 100 MG tablet    Sig: Take one tablet by mouth 2 hours prior to CT    Dispense:  1 tablet    Refill:  0    Patient Instructions  Medication Instructions:  *If you need a refill on your cardiac medications before your next appointment, please call your pharmacy*  Lab Work: If you have labs (blood work) drawn today and your tests are completely normal, you will receive your results only by:  MyChart Message (if you have MyChart) OR  A paper copy in the mail If you have any lab test that is abnormal or we need to change your treatment, we will call you to review the results.   Testing/Procedures: Your physician has requested that you have an echocardiogram. Echocardiography is a painless test that uses sound waves to create images of your heart. It provides your doctor with information about the size and shape of your heart and how well your hearts chambers and valves are working. This procedure takes approximately one hour. There are no restrictions for this procedure.  Your physician has requested that you have cardiac CT. Cardiac computed tomography (CT) is a painless test that uses an x-ray machine to take clear, detailed pictures of your heart. For further information please visit [email or web address].[email or web address]. Please follow instruction sheet as given.  Follow-Up: At [facility], you and your health needs are our priority.  As part of our continuing mission to provide you with exceptional heart care, we have created designated Provider Care Teams.  These Care Teams include your primary Cardiologist (physician) and Advanced Practice Providers (APPs -  Physician Assistants and Nurse  Practitioners) who all work together to provide you with the care you need, when you need it.  We recommend signing up for the patient portal called "MyChart".  Sign up information is provided on this After Visit Summary.  MyChart is used to connect with patients for Virtual Visits (Telemedicine).  Patients are able to view lab/test results,  encounter notes, upcoming appointments, etc.  Non-urgent messages can be sent to your provider as well.   To learn more about what you can do with MyChart, go to [email or web address].    Your next appointment:   3 month(s)  The format for your next appointment:   In Person  Provider:   Berniece Salines, DO   Other Instructions  Your cardiac CT will be scheduled at one of the below locations:   [facility] [address] [place], [place] [address] [phone]  If scheduled at [facility], please arrive at the [facility] main entrance of [facility] 30 minutes prior to test start time. Proceed to the [facility] Radiology Department (first floor) to check-in and test prep.  Please follow these instructions carefully (unless otherwise directed):  Hold all erectile dysfunction medications at least 3 days (72 hrs) prior to test.  On the Night Before the Test:  Be sure to Drink plenty of water.  Do not consume any caffeinated/decaffeinated beverages or chocolate 12 hours prior to your test.  Do not take any antihistamines 12 hours prior to your test.  On the Day of the Test:  Drink plenty of water. Do not drink any water within one hour of the test.  Do not eat any food 4 hours prior to the test.  You may take your regular medications prior to the test.   Take metoprolol (Lopressor) 100 mg two hours prior to test.      After the Test:  Drink plenty of water.  After receiving IV contrast, you may experience a mild flushed feeling. This is normal.  On occasion, you may experience a mild rash up to 24  hours after the test. This is not dangerous. If this occurs, you can take Benadryl 25 mg and increase your fluid intake.  If you experience trouble breathing, this can be serious. If it is severe call 911 IMMEDIATELY. If it is mild, please call our office.  Once we have confirmed authorization from your insurance company, we will call you to set up a date and time for your test. Based on how quickly your insurance processes prior authorizations requests, please allow up to 4 weeks to be contacted for scheduling your Cardiac CT appointment. Be advised that routine Cardiac CT appointments could be scheduled as many as 8 weeks after your provider has ordered it.  For non-scheduling related questions, please contact the cardiac imaging nurse navigator should you have any questions/concerns: Marchia Bond, Cardiac Imaging Nurse Navigator Burley Saver, Interim Cardiac Imaging Nurse [place] and Vascular Services Direct Office Dial: [phone]   For scheduling needs, including cancellations and rescheduling, please call Vivien Rota at [phone], option 3.       Adopting a Healthy Lifestyle.  Know what a healthy weight is for you (roughly BMI <25) and aim to maintain this   Aim for 7+ servings of fruits and vegetables daily   65-80+ fluid ounces of water or unsweet tea for healthy kidneys   Limit to max 1 drink of alcohol per day; avoid smoking/tobacco   Limit animal fats in diet for cholesterol and heart health - choose grass fed whenever available   Avoid highly processed foods, and foods high in saturated/trans fats   Aim for low stress - take time to unwind and care for your mental health   Aim for 150 min of moderate intensity exercise weekly for heart health, and weights twice weekly for bone health  Aim for 7-9 hours of sleep daily   When it comes to diets, agreement about the perfect plan isnt easy to find, even among the experts. Experts at the [employer] developed an idea known as the Healthy Eating Plate. Just imagine a plate divided into logical, healthy portions.   The emphasis is on diet quality:   Load up on vegetables and fruits - one-half of your plate: Aim for color and variety, and remember that potatoes dont count.   Go for whole grains - one-quarter of your plate: Whole wheat, barley, wheat berries, quinoa, oats, brown rice, and foods made with them. If you want pasta, go with whole wheat pasta.   Protein power - one-quarter of your plate: Fish, chicken, beans, and nuts are all healthy, versatile protein sources. Limit red meat.   The diet, however, does go beyond the plate, offering a few other suggestions.   Use healthy plant oils, such as olive, canola, soy, corn, sunflower and peanut. Check the labels, and avoid partially hydrogenated oil, which have unhealthy trans fats.   If youre thirsty, drink water. Coffee and tea are good in moderation, but skip sugary drinks and limit milk and dairy products to one or two daily servings.   The type of carbohydrate in the diet is more important than the amount. Some sources of carbohydrates, such as vegetables, fruits, whole grains, and beans-are healthier than others.   Finally, stay active  Signed, Berniece Salines, DO  03/28/2020 11:07 AM    [place]

## 2020-03-28 NOTE — Patient Instructions (Addendum)
Medication Instructions:  *If you need a refill on your cardiac medications before your next appointment, please call your pharmacy*  Lab Work: If you have labs (blood work) drawn today and your tests are completely normal, you will receive your results only by: Marland Kitchen MyChart Message (if you have MyChart) OR . A paper copy in the mail If you have any lab test that is abnormal or we need to change your treatment, we will call you to review the results.   Testing/Procedures: Your physician has requested that you have an echocardiogram. Echocardiography is a painless test that uses sound waves to create images of your heart. It provides your doctor with information about the size and shape of your heart and how well your heart's chambers and valves are working. This procedure takes approximately one hour. There are no restrictions for this procedure.  Your physician has requested that you have cardiac CT. Cardiac computed tomography (CT) is a painless test that uses an x-ray machine to take clear, detailed pictures of your heart. For further information please visit [email or web address]. Please follow instruction sheet as given.  Follow-Up: At [facility], you and your health needs are our priority.  As part of our continuing mission to provide you with exceptional heart care, we have created designated Provider Care Teams.  These Care Teams include your primary Cardiologist (physician) and Advanced Practice Providers (APPs -  Physician Assistants and Nurse Practitioners) who all work together to provide you with the care you need, when you need it.  We recommend signing up for the patient portal called "MyChart".  Sign up information is provided on this After Visit Summary.  MyChart is used to connect with patients for Virtual Visits (Telemedicine).  Patients are able to view lab/test results, encounter notes, upcoming appointments, etc.  Non-urgent messages can be sent to your provider as well.   To  learn more about what you can do with MyChart, go to [email or web address].    Your next appointment:   3 month(s)  The format for your next appointment:   In Person  Provider:   Berniece Salines, DO   Other Instructions  Your cardiac CT will be scheduled at one of the below locations:   [facility] [address] [place], [place] [address] [phone]  If scheduled at [facility], please arrive at the [facility] main entrance of [facility] 30 minutes prior to test start time. Proceed to the [facility] Radiology Department (first floor) to check-in and test prep.  Please follow these instructions carefully (unless otherwise directed):  Hold all erectile dysfunction medications at least 3 days (72 hrs) prior to test.  On the Night Before the Test: . Be sure to Drink plenty of water. . Do not consume any caffeinated/decaffeinated beverages or chocolate 12 hours prior to your test. . Do not take any antihistamines 12 hours prior to your test.  On the Day of the Test: . Drink plenty of water. Do not drink any water within one hour of the test. . Do not eat any food 4 hours prior to the test. . You may take your regular medications prior to the test.  . Take metoprolol (Lopressor) 100 mg two hours prior to test.      After the Test: . Drink plenty of water. . After receiving IV contrast, you may experience a mild flushed feeling. This is normal. . On occasion, you may experience a mild rash up to 24 hours after  the test. This is not dangerous. If this occurs, you can take Benadryl 25 mg and increase your fluid intake. . If you experience trouble breathing, this can be serious. If it is severe call 911 IMMEDIATELY. If it is mild, please call our office.  Once we have confirmed authorization from your insurance company, we will call you to set up a date and time for your test. Based on how quickly your insurance processes prior authorizations  requests, please allow up to 4 weeks to be contacted for scheduling your Cardiac CT appointment. Be advised that routine Cardiac CT appointments could be scheduled as many as 8 weeks after your provider has ordered it.  For non-scheduling related questions, please contact the cardiac imaging nurse navigator should you have any questions/concerns: Marchia Bond, Cardiac Imaging Nurse Navigator Burley Saver, Interim Cardiac Imaging Nurse [place] and Vascular Services Direct Office Dial: [phone]   For scheduling needs, including cancellations and rescheduling, please call Vivien Rota at [phone], option 3.

## 2020-03-30 ENCOUNTER — Encounter: Payer: Self-pay | Admitting: Family Medicine

## 2020-03-30 DIAGNOSIS — F410 Panic disorder [episodic paroxysmal anxiety] without agoraphobia: Secondary | ICD-10-CM | POA: Diagnosis not present

## 2020-03-30 DIAGNOSIS — F411 Generalized anxiety disorder: Secondary | ICD-10-CM | POA: Diagnosis not present

## 2020-04-17 ENCOUNTER — Other Ambulatory Visit: Payer: Self-pay

## 2020-04-17 ENCOUNTER — Ambulatory Visit (HOSPITAL_BASED_OUTPATIENT_CLINIC_OR_DEPARTMENT_OTHER)
Admission: RE | Admit: 2020-04-17 | Discharge: 2020-04-17 | Disposition: A | Discharge status: Home / Self Care | Payer: BC Managed Care – PPO | Source: Ambulatory Visit | Attending: Cardiology | Admitting: Cardiology

## 2020-04-17 DIAGNOSIS — R079 Chest pain, unspecified: Secondary | ICD-10-CM | POA: Insufficient documentation

## 2020-04-17 DIAGNOSIS — R030 Elevated blood-pressure reading, without diagnosis of hypertension: Secondary | ICD-10-CM | POA: Insufficient documentation

## 2020-04-17 LAB — ECHOCARDIOGRAM COMPLETE
Area-P 1/2: 4.06 cm2
S' Lateral: 3.26 cm

## 2020-04-17 NOTE — Progress Notes (Signed)
  Echocardiogram 2D Echocardiogram has been performed.  Philip Clark 04/17/2020, 9:49 AM

## 2020-04-18 ENCOUNTER — Telehealth: Payer: Self-pay

## 2020-04-18 NOTE — Telephone Encounter (Signed)
Spoke with patient regarding results and recommendation.  Patient verbalizes understanding and is agreeable to plan of care. Advised patient to call back with any issues or concerns.  

## 2020-04-18 NOTE — Telephone Encounter (Signed)
-----   Message from Berniece Salines, DO sent at 04/17/2020  5:16 PM EST ----- Echo normal

## 2020-04-19 ENCOUNTER — Telehealth (HOSPITAL_COMMUNITY): Payer: Self-pay | Admitting: Emergency Medicine

## 2020-04-19 NOTE — Telephone Encounter (Signed)
Reaching out to patient to offer assistance regarding upcoming cardiac imaging study; pt verbalizes understanding of appt date/time, parking situation and where to check in, pre-test NPO status and medications ordered, and verified current allergies; name and call back number provided for further questions should they arise Marchia Bond RN Navigator Cardiac Imaging [place] and Vascular [phone] office [phone] cell  Pt reminded to pick up one time dose metoprolol and to take 2 hr prior to scan Sherilee Smotherman

## 2020-04-24 ENCOUNTER — Ambulatory Visit (HOSPITAL_COMMUNITY): Payer: BC Managed Care – PPO

## 2020-05-08 ENCOUNTER — Telehealth (HOSPITAL_COMMUNITY): Payer: Self-pay | Admitting: Emergency Medicine

## 2020-05-08 NOTE — Telephone Encounter (Signed)
Reaching out to patient to offer assistance regarding upcoming cardiac imaging study; pt verbalizes understanding of appt date/time, parking situation and where to check in, pre-test NPO status and medications ordered, and verified current allergies; name and call back number provided for further questions should they arise Philip Bond RN Navigator Cardiac Imaging [facility] Vascular [phone] office [phone] cell  Pt reminded to pick up medication and take 100mg  metop 2 hr prior to scan Philip Clark

## 2020-05-09 ENCOUNTER — Encounter (HOSPITAL_COMMUNITY): Payer: Self-pay

## 2020-05-09 ENCOUNTER — Other Ambulatory Visit: Payer: Self-pay

## 2020-05-09 ENCOUNTER — Ambulatory Visit (HOSPITAL_COMMUNITY)
Admission: RE | Admit: 2020-05-09 | Discharge: 2020-05-09 | Disposition: A | Discharge status: Home / Self Care | Payer: BC Managed Care – PPO | Source: Ambulatory Visit | Attending: Cardiology | Admitting: Cardiology

## 2020-05-09 ENCOUNTER — Encounter: Payer: BC Managed Care – PPO | Admitting: *Deleted

## 2020-05-09 DIAGNOSIS — Z006 Encounter for examination for normal comparison and control in clinical research program: Secondary | ICD-10-CM

## 2020-05-09 DIAGNOSIS — R030 Elevated blood-pressure reading, without diagnosis of hypertension: Secondary | ICD-10-CM | POA: Diagnosis not present

## 2020-05-09 DIAGNOSIS — R079 Chest pain, unspecified: Secondary | ICD-10-CM | POA: Insufficient documentation

## 2020-05-09 MED ORDER — NITROGLYCERIN 0.4 MG SL SUBL: 0.8000 mg | SUBLINGUAL_TABLET | Freq: Once | SUBLINGUAL | Status: DC

## 2020-05-09 MED ORDER — NITROGLYCERIN 0.4 MG SL SUBL
SUBLINGUAL_TABLET | SUBLINGUAL | Status: AC
  Filled 2020-05-09: qty 2

## 2020-05-09 MED ORDER — IOHEXOL 350 MG/ML SOLN
80.0000 mL | Freq: Once | INTRAVENOUS | Status: AC | PRN
  Administered 2020-05-09: 12:00:00 80 mL via INTRAVENOUS

## 2020-05-09 NOTE — Research (Signed)
CADFEM 4 Informed Consent   Subject Name: Philip Clark  Subject met inclusion and exclusion criteria.  The informed consent form, study requirements and expectations were reviewed with the subject and questions and concerns were addressed prior to the signing of the consent form.  The subject verbalized understanding of the trial requirements.  The subject agreed to participate in the CADFEM 4 trial and signed the informed consent at 1145 on 05/09/2020.  The informed consent was obtained prior to performance of any protocol-specific procedures for the subject.  A copy of the signed informed consent was given to the subject and a copy was placed in the subject's medical record.   [name]

## 2020-07-07 DIAGNOSIS — T7840XA Allergy, unspecified, initial encounter: Secondary | ICD-10-CM | POA: Insufficient documentation

## 2020-07-07 DIAGNOSIS — F419 Anxiety disorder, unspecified: Secondary | ICD-10-CM | POA: Insufficient documentation

## 2020-07-07 DIAGNOSIS — J40 Bronchitis, not specified as acute or chronic: Secondary | ICD-10-CM | POA: Insufficient documentation

## 2020-07-10 ENCOUNTER — Ambulatory Visit: Payer: BC Managed Care – PPO | Admitting: Cardiology

## 2020-09-27 ENCOUNTER — Encounter: Payer: Self-pay | Admitting: Nurse Practitioner

## 2020-10-04 ENCOUNTER — Telehealth: Payer: Self-pay

## 2020-10-04 DIAGNOSIS — Z006 Encounter for examination for normal comparison and control in clinical research program: Secondary | ICD-10-CM

## 2020-10-04 NOTE — Telephone Encounter (Signed)
I called patient for hid 90-day Identify Study follow up phone call. Patient is doing well with no cardiac symptoms at this time. I reminded patient I would call him in Dec. for 1 year follow-up.

## 2020-10-18 ENCOUNTER — Ambulatory Visit: Payer: BC Managed Care – PPO | Admitting: Family Medicine

## 2020-10-18 ENCOUNTER — Encounter: Payer: Self-pay | Admitting: Family Medicine

## 2020-10-18 ENCOUNTER — Other Ambulatory Visit: Payer: Self-pay

## 2020-10-18 VITALS — BP 126/76 | HR 97 | Temp 97.8°F | Ht 73.0 in | Wt 243.2 lb

## 2020-10-18 DIAGNOSIS — S343XXA Injury of cauda equina, initial encounter: Secondary | ICD-10-CM

## 2020-10-18 DIAGNOSIS — M9908 Segmental and somatic dysfunction of rib cage: Secondary | ICD-10-CM | POA: Diagnosis not present

## 2020-10-18 MED ORDER — METHOCARBAMOL 500 MG PO TABS: 500.0000 mg | ORAL_TABLET | Freq: Three times a day (TID) | ORAL | 0 refills | Status: DC | PRN

## 2020-10-18 MED ORDER — TOPIRAMATE 200 MG PO TABS: 200.0000 mg | ORAL_TABLET | Freq: Two times a day (BID) | ORAL | 0 refills | Status: DC

## 2020-10-18 MED ORDER — PREDNISONE 10 MG (48) PO TBPK: ORAL_TABLET | ORAL | 0 refills | Status: DC

## 2020-10-18 NOTE — Progress Notes (Signed)
Established Patient Office Visit  Subjective:  Patient ID: Philip Clark, male    DOB: [birth year]  Age: 35 y.o. MRN: [id number]  CC:  Chief Complaint  Patient presents with  . Pain    Lower back pains that radiates to back of legs and thighs x 1 month right side pain that come and go x 1 year does not seem to want to go away. Patient not fasting.     HPI Philip Clark presents for evaluation of a 1 to 15-month history of lower back pain that has been radiating down the lateral anterior aspect of both of his thighs.  There was no specific injury.  It did not seem to worsen after he had gone to the gym doing squats and dead lifts.  He denies any numbness or tingling in the groin area.  When the pain radiates he does feel the need to defecate but then it quickly passes.  He has had no incontinence of his urine or stool at this point.  There is been no weakness in his lower extremities.  He has had an issue with his right lower anterior rib cage.  It is bothered him with range of motion through the chest wall and abdomen.  It comes and goes.  Seems to be getting better but then returns.  He does work for the [employer].  Past Medical History:  Diagnosis Date  . Allergy   . Anxiety   . Bronchitis     Past Surgical History:  Procedure Laterality Date  . ANTERIOR CRUCIATE LIGAMENT (ACL) REVISION Left     Family History  Problem Relation Age of Onset  . Anxiety disorder Mother   . Heart disease Father 69       MI x 2  . Dementia Maternal Grandmother   . Heart disease Maternal Grandfather 23       CAD with CABG  . Early death Paternal Grandfather 60       CAD/MI    Social History   Socioeconomic History  . Marital status: Married    Spouse name: Not on file  . Number of children: Not on file  . Years of education: Not on file  . Highest education level: Not on file  Occupational History  . Not on file  Tobacco Use  . Smoking status: Never Smoker  . Smokeless tobacco:  Never Used  Vaping Use  . Vaping Use: Never used  Substance and Sexual Activity  . Alcohol use: Yes    Alcohol/week: 1.0 standard drink    Types: 1 Cans of beer per week    Comment: occ  . Drug use: No  . Sexual activity: Yes  Other Topics Concern  . Not on file  Social History Narrative  . Not on file   Social Determinants of Health   Financial Resource Strain: Not on file  Food Insecurity: Not on file  Transportation Needs: Not on file  Physical Activity: Not on file  Stress: Not on file  Social Connections: Not on file  Intimate Partner Violence: Not on file    Outpatient Medications Prior to Visit  Medication Sig Dispense Refill  . ALPRAZolam (XANAX) 0.5 MG tablet Take 0.5-1 tablets (0.25-0.5 mg total) by mouth 2 (two) times daily as needed for anxiety. 7 tablet 0  . hydrOXYzine (ATARAX/VISTARIL) 25 MG tablet Take 1 tablet (25 mg total) by mouth every 6 (six) hours as needed for anxiety. 20 tablet 0  . metoprolol tartrate (LOPRESSOR)  100 MG tablet Take one tablet by mouth 2 hours prior to CT 1 tablet 0   No facility-administered medications prior to visit.    No Known Allergies  ROS Review of Systems  Constitutional: Negative.   Respiratory: Negative.   Cardiovascular: Negative.   Gastrointestinal: Negative.   Musculoskeletal: Positive for back pain and myalgias.  Neurological: Negative for weakness and numbness.  Hematological: Does not bruise/bleed easily.  Psychiatric/Behavioral: Negative.       Objective:    Physical Exam Vitals and nursing note reviewed.  Constitutional:      General: He is not in acute distress.    Appearance: Normal appearance. He is not ill-appearing, toxic-appearing or diaphoretic.  HENT:     Head: Normocephalic and atraumatic.  Eyes:     General: No scleral icterus.       Right eye: No discharge.        Left eye: No discharge.     Conjunctiva/sclera: Conjunctivae normal.  Pulmonary:     Effort: Pulmonary effort is normal.   Musculoskeletal:     Lumbar back: No deformity, spasms, tenderness or bony tenderness. Normal range of motion. Negative right straight leg raise test and negative left straight leg raise test.  Neurological:     Mental Status: He is alert and oriented to person, place, and time.     Motor: No weakness.     Deep Tendon Reflexes:     Reflex Scores:      Patellar reflexes are 2+ on the right side and 1+ on the left side.      Achilles reflexes are 1+ on the right side and 1+ on the left side. Psychiatric:        Mood and Affect: Mood normal.        Behavior: Behavior normal.     BP 126/76   Pulse 97   Temp 97.8 F (36.6 C) (Temporal)   Ht 6\' 1"  (1.854 m)   Wt 243 lb 3.2 oz (110.3 kg)   SpO2 100%   BMI 32.09 kg/m  Wt Readings from Last 3 Encounters:  10/18/20 243 lb 3.2 oz (110.3 kg)  03/28/20 247 lb (112 kg)  03/17/20 240 lb (108.9 kg)     Health Maintenance Due  Topic Date Due  . HIV Screening  Never done  . Hepatitis C Screening  Never done    There are no preventive care reminders to display for this patient.  Lab Results  Component Value Date   TSH 2.30 02/01/2020   Lab Results  Component Value Date   WBC 7.7 03/17/2020   HGB 13.9 03/17/2020   HCT 41.0 03/17/2020   MCV 88.3 03/17/2020   PLT 305 03/17/2020   Lab Results  Component Value Date   NA 141 03/17/2020   K 4.0 03/17/2020   CO2 26 03/17/2020   GLUCOSE 96 03/17/2020   BUN 14 03/17/2020   CREATININE 1.20 03/17/2020   BILITOT 0.9 02/01/2020   ALKPHOS 52 02/01/2020   AST 32 02/01/2020   ALT 25 02/01/2020   PROT 7.5 02/01/2020   ALBUMIN 4.5 02/01/2020   CALCIUM 9.2 03/17/2020   ANIONGAP 9 03/17/2020   GFR 80.74 02/01/2020   Lab Results  Component Value Date   CHOL 155 02/01/2020   Lab Results  Component Value Date   HDL 40.90 02/01/2020   Lab Results  Component Value Date   LDLCALC 100 (H) 02/01/2020   Lab Results  Component Value Date   TRIG 73.0  02/01/2020   Lab Results   Component Value Date   CHOLHDL 4 02/01/2020   No results found for: HGBA1C    Assessment & Plan:   Problem List Items Addressed This Visit      Nervous and Auditory   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 predniSONE (STERAPRED UNI-PAK 48 TAB) 10 MG (48) TBPK tablet    Sig: Pharm to dose a 12 day dose pack.    Dispense:  48 tablet    Refill:  0  . methocarbamol (ROBAXIN) 500 MG tablet    Sig: Take 1 tablet (500 mg total) by mouth every 8 (eight) hours as needed for muscle spasms.    Dispense:  60 tablet    Refill:  0  . topiramate (TOPAMAX) 200 MG tablet    Sig: Take 1 tablet (200 mg total) by mouth 2 (two) times daily.    Dispense:  60 tablet    Refill:  0    Follow-up: No follow-ups on file.  Should he develop any loss of bowel or bladder function with numbness or tingling in the groin area, he is to present to emergency room immediately.  Not to lift over 20 pounds with no repetitive or bending stooping work until seen by sports medicine.Libby Maw, MD

## 2020-10-19 ENCOUNTER — Ambulatory Visit: Payer: Self-pay

## 2020-10-19 ENCOUNTER — Ambulatory Visit: Payer: BC Managed Care – PPO | Admitting: Family Medicine

## 2020-10-19 ENCOUNTER — Other Ambulatory Visit: Payer: Self-pay

## 2020-10-19 ENCOUNTER — Ambulatory Visit (HOSPITAL_BASED_OUTPATIENT_CLINIC_OR_DEPARTMENT_OTHER)
Admission: RE | Admit: 2020-10-19 | Discharge: 2020-10-19 | Disposition: A | Discharge status: Home / Self Care | Payer: BC Managed Care – PPO | Source: Ambulatory Visit | Attending: Family Medicine | Admitting: Family Medicine

## 2020-10-19 VITALS — BP 122/78 | Ht 74.0 in | Wt 243.0 lb

## 2020-10-19 DIAGNOSIS — M545 Low back pain, unspecified: Secondary | ICD-10-CM | POA: Diagnosis not present

## 2020-10-19 DIAGNOSIS — R0781 Pleurodynia: Secondary | ICD-10-CM | POA: Insufficient documentation

## 2020-10-19 DIAGNOSIS — M5416 Radiculopathy, lumbar region: Secondary | ICD-10-CM

## 2020-10-19 NOTE — Assessment & Plan Note (Signed)
Reports intermittent rib pain has been ongoing for a few years.  Sounds more nerve related as opposed to a bony or muscle origin. -Counseled on home exercise therapy and supportive care. -Try topical lidocaine. -Could consider nerve block

## 2020-10-19 NOTE — Progress Notes (Signed)
Philip Clark - 35 y.o. male MRN [id number]  Date of birth: [birth year]  SUBJECTIVE:  Including CC & ROS.  No chief complaint on file.   Philip Clark is a 35 y.o. male that is presenting with bilateral lower leg pain that has been ongoing for the past few months.  He felt that working it did improve his symptoms.  Has been taking naproxen with helps.  Also endorses a right-sided rib pain has been ongoing for about 3 years.   Review of Systems See HPI   HISTORY: Past Medical, Surgical, Social, and Family History Reviewed & Updated per EMR.   Pertinent Historical Findings include:  Past Medical History:  Diagnosis Date  . Allergy   . Anxiety   . Bronchitis     Past Surgical History:  Procedure Laterality Date  . ANTERIOR CRUCIATE LIGAMENT (ACL) REVISION Left     Family History  Problem Relation Age of Onset  . Anxiety disorder Mother   . Heart disease Father 63       MI x 2  . Dementia Maternal Grandmother   . Heart disease Maternal Grandfather 11       CAD with CABG  . Early death Paternal Grandfather 59       CAD/MI    Social History   Socioeconomic History  . Marital status: Married    Spouse name: Not on file  . Number of children: Not on file  . Years of education: Not on file  . Highest education level: Not on file  Occupational History  . Not on file  Tobacco Use  . Smoking status: Never Smoker  . Smokeless tobacco: Never Used  Vaping Use  . Vaping Use: Never used  Substance and Sexual Activity  . Alcohol use: Yes    Alcohol/week: 1.0 standard drink    Types: 1 Cans of beer per week    Comment: occ  . Drug use: No  . Sexual activity: Yes  Other Topics Concern  . Not on file  Social History Narrative  . Not on file   Social Determinants of Health   Financial Resource Strain: Not on file  Food Insecurity: Not on file  Transportation Needs: Not on file  Physical Activity: Not on file  Stress: Not on file  Social Connections: Not on file   Intimate Partner Violence: Not on file     PHYSICAL EXAM:  VS: BP 122/78   Ht 6\' 2"  (1.88 m)   Wt 243 lb (110.2 kg)   BMI 31.20 kg/m  Physical Exam Gen: NAD, alert, cooperative with exam, well-appearing MSK:  Right and left leg: Normal strength left hip flexion resistance. Normal strength with plantar and dorsiflexion. +2 deep tendon reflexes at the patella and Achilles. Neurovascular intact  Limited ultrasound: Right ribs:  No changes appreciated in the area of pain.  Normal underlying bowel movement. No abnormal cyst or mass.  Summary: No structural changes appreciated  Ultrasound and interpretation by Clearance Coots, MD    ASSESSMENT & PLAN:   Lumbar radiculopathy Min a component of spinal stenosis contributing to his symptoms.  He does have reflexes intact and good strength. -Counseled home exercise therapy and supportive care. -X-ray. -MRI scheduled for this weekend.  Rib pain Reports intermittent rib pain has been ongoing for a few years.  Sounds more nerve related as opposed to a bony or muscle origin. -Counseled on home exercise therapy and supportive care. -Try topical lidocaine. -Could consider nerve block

## 2020-10-19 NOTE — Assessment & Plan Note (Signed)
Min a component of spinal stenosis contributing to his symptoms.  He does have reflexes intact and good strength. -Counseled home exercise therapy and supportive care. -X-ray. -MRI scheduled for this weekend.

## 2020-10-19 NOTE — Patient Instructions (Signed)
Nice to meet you I will call with the xray results.  Please try the topical lidocaine   Please send me a message in MyChart with any questions or updates.  I will look at the MRI results and see what's the next best option.   --Dr. Raeford Razor

## 2020-10-21 ENCOUNTER — Ambulatory Visit (HOSPITAL_BASED_OUTPATIENT_CLINIC_OR_DEPARTMENT_OTHER)
Admission: RE | Admit: 2020-10-21 | Discharge: 2020-10-21 | Disposition: A | Discharge status: Home / Self Care | Payer: BC Managed Care – PPO | Source: Ambulatory Visit | Attending: Family Medicine | Admitting: Family Medicine

## 2020-10-21 ENCOUNTER — Other Ambulatory Visit: Payer: Self-pay

## 2020-10-21 DIAGNOSIS — M5117 Intervertebral disc disorders with radiculopathy, lumbosacral region: Secondary | ICD-10-CM | POA: Diagnosis not present

## 2020-10-21 DIAGNOSIS — S343XXA Injury of cauda equina, initial encounter: Secondary | ICD-10-CM

## 2020-10-21 DIAGNOSIS — M4807 Spinal stenosis, lumbosacral region: Secondary | ICD-10-CM | POA: Diagnosis not present

## 2020-10-21 DIAGNOSIS — M5116 Intervertebral disc disorders with radiculopathy, lumbar region: Secondary | ICD-10-CM | POA: Diagnosis not present

## 2020-10-21 DIAGNOSIS — M48061 Spinal stenosis, lumbar region without neurogenic claudication: Secondary | ICD-10-CM | POA: Diagnosis not present

## 2020-10-24 ENCOUNTER — Telehealth: Payer: Self-pay | Admitting: Family Medicine

## 2020-10-24 DIAGNOSIS — M5416 Radiculopathy, lumbar region: Secondary | ICD-10-CM

## 2020-10-24 NOTE — Telephone Encounter (Signed)
Spoke with patient.   Rosemarie Ax, MD [facility] 10/24/2020, 1:37 PM

## 2020-10-24 NOTE — Telephone Encounter (Signed)
Pt called states he spk w/provider earlier but forgot to ask if Work note & Work Restrictions advised  - Per pt if yes then can Doctor  advise if Light Duty/ Full duty or Wt / Lbs lifting limitations needed since returning to work.  --Forwarding message to provider to contact pt @ [phone].  --glh

## 2020-10-24 NOTE — Telephone Encounter (Signed)
Informed of results. Will place referral to PT.   Rosemarie Ax, MD [facility] 10/24/2020, 9:10 AM

## 2020-11-15 ENCOUNTER — Other Ambulatory Visit: Payer: Self-pay

## 2020-11-15 ENCOUNTER — Ambulatory Visit: Payer: BC Managed Care – PPO | Admitting: Family Medicine

## 2020-11-15 ENCOUNTER — Encounter: Payer: Self-pay | Admitting: Family Medicine

## 2020-11-15 VITALS — BP 120/80 | HR 89 | Temp 97.9°F | Ht 74.0 in | Wt 237.0 lb

## 2020-11-15 DIAGNOSIS — S40261A Insect bite (nonvenomous) of right shoulder, initial encounter: Secondary | ICD-10-CM

## 2020-11-15 DIAGNOSIS — W57XXXA Bitten or stung by nonvenomous insect and other nonvenomous arthropods, initial encounter: Secondary | ICD-10-CM

## 2020-11-15 DIAGNOSIS — S80262A Insect bite (nonvenomous), left knee, initial encounter: Secondary | ICD-10-CM | POA: Diagnosis not present

## 2020-11-15 DIAGNOSIS — S90562A Insect bite (nonvenomous), left ankle, initial encounter: Secondary | ICD-10-CM | POA: Diagnosis not present

## 2020-11-15 NOTE — Progress Notes (Signed)
[facility] [address] [place] [address] Dept: [phone] Dept Fax: [phone]  Office Visit  Subjective:    Patient ID: Philip Clark, male    DOB: [birth year], 35 y.o..   MRN: [id number]  Chief Complaint  Patient presents with   Acute Visit    C/o having a couple tick bites 2 weeks ago. He then has had fatigue, chills, HA's, sweats, nausea, joint pain x 1 week.      History of Present Illness:  Patient is in today for evaluation related to recent tick bites. Philip Clark notes that he found an imbedded tick the first week of June and two the 2nd week. The bites were on the right anterior shoulder, left inner knee, and left ankle area. He believes each of these were attached for less than 24 hours. Since then, he has noted some joint achiness, headaches, fatigue and chills. He notes that these symptoms are very unusual for him to have. He has had some localized itching at the bite sites, but has noted noted any specific rash.  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 ([place]) 10/18/2020   Bronchitis    Anxiety    Allergy    S/P ACL reconstruction 07/16/2016   Injury of left knee 05/28/2016   Knee LCL sprain 11/24/2014   Past Surgical History:  Procedure Laterality Date   ANTERIOR CRUCIATE LIGAMENT (ACL) REVISION Left    Family History  Problem Relation Age of Onset   Anxiety disorder Mother    Heart disease Father 84       MI x 2   Dementia Maternal Grandmother    Heart disease Maternal Grandfather 54       CAD with CABG   Early death Paternal Grandfather 34       CAD/MI   Outpatient Medications Prior to Visit  Medication Sig Dispense Refill   methocarbamol (ROBAXIN) 500 MG tablet Take 1 tablet (500 mg total) by mouth every 8 (eight) hours as  needed for muscle spasms. 60 tablet 0   topiramate (TOPAMAX) 200 MG tablet Take 1 tablet (200 mg total) by mouth 2 (two) times daily. 60 tablet 0   predniSONE (STERAPRED UNI-PAK 48 TAB) 10 MG (48) TBPK tablet Pharm to dose a 12 day dose pack. (Patient not taking: Reported on 11/15/2020) 48 tablet 0   No facility-administered medications prior to visit.   No Known Allergies    Objective:   Today's Vitals   11/15/20 1432  BP: 120/80  Pulse: 89  Temp: 97.9 F (36.6 C)  TempSrc: Temporal  SpO2: 99%  Weight: 237 lb (107.5 kg)  Height: 6\' 2"  (1.88 m)   Body mass index is 30.43 kg/m.   General: Well developed, well nourished. No acute distress. Skin: Warm and dry. The bite site at the left shoulder shows a 2 mm central hive like lesion with    surrounding erythema. No ulceration or target lesion noted. There is no visible lesion at the   other two sites. However, he does have a small area of eschar with surrounding erythema on   the right ankle. Psych: Alert and oriented. Normal mood and affect.  Health Maintenance Due  Topic Date Due   HIV Screening  Never done   Hepatitis C Screening  Never done  Assessment & Plan:   1. Tick bite, unspecified site, initial encounter Discussed that he likely has a low risk for Lyme disease. Reviewed options for watchful waiting vs. serial blood tests. He prefers to move ahead with testing at this point. If the initial serology is negative, we will plan for a repeat in 3 weeks.  - Lyme Disease Serology w/Reflex  Haydee Salter, MD

## 2020-11-16 ENCOUNTER — Telehealth: Payer: Self-pay

## 2020-11-16 NOTE — Telephone Encounter (Signed)
Patient notified VIA phone. No further questions. Dm/cma  

## 2020-11-16 NOTE — Telephone Encounter (Signed)
Pt calling to request a blood test called, [facility] Spotted Fever.  Pt had an appointment yesterday with Dr. Gena Fray and forgot to ask him to test him for this as well as the other labs.  Pt would like a call back to inform him as to weather Dr. Gena Fray will test this for him.  Please advise.CB# [phone]

## 2020-11-17 ENCOUNTER — Encounter: Payer: Self-pay | Admitting: Physical Therapy

## 2020-11-17 ENCOUNTER — Other Ambulatory Visit: Payer: Self-pay

## 2020-11-17 ENCOUNTER — Ambulatory Visit: Payer: BC Managed Care – PPO | Attending: Family Medicine | Admitting: Physical Therapy

## 2020-11-17 DIAGNOSIS — R252 Cramp and spasm: Secondary | ICD-10-CM

## 2020-11-17 DIAGNOSIS — M5441 Lumbago with sciatica, right side: Secondary | ICD-10-CM | POA: Insufficient documentation

## 2020-11-17 DIAGNOSIS — M6281 Muscle weakness (generalized): Secondary | ICD-10-CM | POA: Diagnosis not present

## 2020-11-17 DIAGNOSIS — G8929 Other chronic pain: Secondary | ICD-10-CM | POA: Diagnosis not present

## 2020-11-17 DIAGNOSIS — M5442 Lumbago with sciatica, left side: Secondary | ICD-10-CM | POA: Insufficient documentation

## 2020-11-17 LAB — LYME DISEASE SEROLOGY W/REFLEX: Lyme Total Antibody EIA: NEGATIVE

## 2020-11-17 NOTE — Addendum Note (Signed)
Addended by: Haydee Salter on: 11/17/2020 05:20 PM   Modules accepted: Orders

## 2020-11-17 NOTE — Patient Instructions (Signed)
Access Code: [email or web address] URL: [email or web address] Date: 11/17/2020 Prepared by: Amador Cunas  Exercises Supine Bridge - 1 x daily - 7 x weekly - 2 sets - 10 reps - 3 sec hold Supine Piriformis Stretch with Leg Straight - 1 x daily - 7 x weekly - 2 sets - 2 reps - 15-20 sec hold Supine 90/90 Alternating Toe Touch - 1 x daily - 7 x weekly - 2 sets - 10 reps Seated Table Hamstring Stretch - 1 x daily - 7 x weekly - 2 sets - 2 reps - 15-20 sec hold

## 2020-11-17 NOTE — Therapy (Signed)
[facility]. [place], [place], [address] Phone: [phone]   Fax:  [phone]  Physical Therapy Evaluation  Patient Details  Name: Philip Clark MRN: [id number] Date of Birth: [birth year] Referring Provider (PT): Clinton Quant Date: 11/17/2020   PT End of Session - 11/17/20 1148     Visit Number 1    Date for PT Re-Evaluation 01/17/21    PT Start Time 1100    PT Stop Time 1135    PT Time Calculation (min) 35 min    Activity Tolerance Patient tolerated treatment well    Behavior During Therapy [facility] for tasks assessed/performed             Past Medical History:  Diagnosis Date   Allergy    Anxiety    Bronchitis     Past Surgical History:  Procedure Laterality Date   ANTERIOR CRUCIATE LIGAMENT (ACL) REVISION Left     There were no vitals filed for this visit.    Subjective Assessment - 11/17/20 1105     Subjective Pt reports that he has had LBP with BLE radiating pain down posterior LEs for ~2-3 months. Was having feeling of having to defecate when lifting; denies saddle anesthesia or N/T. MRI shows degenerative changes but no cord compression. Pt reports that he has ceased squats/deadlifts at the gym d/t increase in back pain. Pt also reports recent tick bites; has been having systemic issues like joint aches, fever/chills, headaches, and fatigue. Pt tested neg for COVID several times. Being tested for Lyme disease currently. Denies N/T in BLEs.    Limitations Lifting;Standing    Diagnostic tests MRI lumbar spine    Patient Stated Goals reduce pain, return to full duty at work    Currently in Pain? Yes    Pain Score 2     Pain Location Back    Pain Orientation Lower    Pain Descriptors / Indicators Aching;Constant    Pain Type Acute pain    Pain Radiating Towards posterior/lateral BLEs to knee    Pain Onset More than a month ago    Pain Frequency Constant    Aggravating Factors  heavy  lifting, bending, twisting    Pain Relieving Factors rest, lying supine                [facility] PT Assessment - 11/17/20 0001       Assessment   Medical Diagnosis LBP with BLE radiating pain    Referring Provider (PT) Raeford Razor    Next MD Visit --   will call back PRN   Prior Therapy yes for L ACL repair      Precautions   Precautions None      Restrictions   Weight Bearing Restrictions No      Balance Screen   Has the patient fallen in the past 6 months No    Has the patient had a decrease in activity level because of a fear of falling?  No    Is the patient reluctant to leave their home because of a fear of falling?  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  Sensation   Light Touch Appears Intact      Functional Tests   Functional tests Sit to Stand      Sit to Stand   Comments 5x STS WFL; mild pulling in LB      Posture/Postural Control   Posture/Postural Control Postural limitations    Postural Limitations Rounded Shoulders;Forward head      ROM / Strength   AROM / PROM / Strength AROM;Strength      AROM   AROM Assessment Site Lumbar    Lumbar Flexion limited 25% d/t HS tightness    Lumbar Extension WFL    Lumbar - Right Side Bend <25% limited    Lumbar - Left Side Bend <25% limited    Lumbar - Right Rotation WFL    Lumbar - Left Rotation WFL      Strength   Overall Strength Comments BLE 5/5; core weakness, reports mild pain/cramping in LB with hip flexor MMT      Flexibility   Soft Tissue Assessment /Muscle Length yes    Hamstrings very tight BLE    ITB tight    Piriformis tight      Palpation   Palpation comment ttp lumbar paraspinals      Special Tests   Other  special tests SLR 80 deg BLE      Transfers   Five time sit to stand comments  WFL; reports mild pulling in LB                        Objective measurements completed on examination: See above findings.       [facility] Adult PT Treatment/Exercise - 11/17/20 0001       Exercises   Exercises Lumbar                    PT Education - 11/17/20 1148     Education Details Pt educated on POC and HEP    Person(s) Educated Patient    Methods Explanation;Demonstration;Handout    Comprehension Verbalized understanding;Returned demonstration              PT Short Term Goals - 11/17/20 1155       PT SHORT TERM GOAL #1   Title Pt will be I with initial HEP    Time 2    Period Weeks    Status New    Target Date 12/01/20               PT Long Term Goals - 11/17/20 1155       PT LONG TERM GOAL #1   Title Pt will be I with advanced HEP    Time 6    Period Weeks    Status New    Target Date 12/29/20      PT LONG TERM GOAL #2   Title Pt will report resolution of BLE radiating pain    Time 6    Period Weeks    Status New    Target Date 12/29/20      PT LONG TERM GOAL #3   Title Pt will report 50% reduction in LBP    Time 6    Period Weeks    Status New    Target Date 12/29/20      PT LONG TERM GOAL #4   Title Pt will demo lumbar flexion WFL with no increase in LBP    Time 6    Period Weeks  Status New    Target Date 12/29/20      PT LONG TERM GOAL #5   Title Pt will report able to perform all ADLs and work duties with no increase in LBP    Time 6    Period Weeks    Status New    Target Date 12/29/20                    Plan - 11/17/20 1149     Clinical Impression Statement Pt presents to clinic with reports of acute LBP present for ~3 months. Unsure of MOI but has moved homes several times in the past 6 months requiring sig heavy lifting/bending/carrying. Pt works as a Airline pilot; still working full duty with the  option to switch to light duty if needed. Has had lumbar MRI to screen for cauda equina syndrome; neg for cord compression. Does demo degenerative changes lower lumbar, see images for details. In clinic, pt demos LE flexibility deficits, tight HS BLE, ttp lumbar paraspinals, and core instability/weakness. Additionally of note, pt is being screened for potential Lyme disease as he has been experiencing some systemic symptoms following several tick bites; follow up on this with patient. Pt would benefit from skilled PT to address the above impairments and return to gym routine with no increase in LBP.    Examination-Participation Restrictions Occupation;Community Activity;Interpersonal Relationship    Stability/Clinical Decision Making Stable/Uncomplicated    Clinical Decision Making Low    Rehab Potential Good    PT Frequency 2x / week    PT Duration 6 weeks    PT Treatment/Interventions ADLs/Self Care Home Management;Electrical Stimulation;Iontophoresis 4mg /ml Dexamethasone;Moist Heat;Traction;Neuromuscular re-education;Therapeutic exercise;Therapeutic activities;Functional mobility training;Patient/family education;Manual techniques;Dry needling;Passive range of motion    PT Next Visit Plan review/progress HEP, lumbar/core stab, LE flexibility, traction if indicated, manual/modalities as indicated, follow up on lyme disease screening    PT Home Exercise Plan see pt instructions    Consulted and Agree with Plan of Care Patient             Patient will benefit from skilled therapeutic intervention in order to improve the following deficits and impairments:  Decreased range of motion, Increased muscle spasms, Pain, Hypomobility, Impaired flexibility, Decreased strength  Visit Diagnosis: Chronic bilateral low back pain with bilateral sciatica  Muscle weakness (generalized)  Cramp and spasm     Problem List Patient Active Problem List   Diagnosis Date Noted   Rib pain 10/19/2020    Lumbar radiculopathy 10/19/2020   Rib cage dysfunction 10/18/2020   Cauda equina injury without bone injury ([place]) 10/18/2020   Bronchitis    Anxiety    Allergy    S/P ACL reconstruction 07/16/2016   Injury of left knee 05/28/2016   Knee LCL sprain 11/24/2014   Amador Cunas, PT, DPT Donald Prose Kartel Wolbert 11/17/2020, 11:57 AM  [facility] [place]. [place], [place], [address] Phone: [phone]   Fax:  [phone]  Name: Philip Clark MRN: [id number] Date of Birth: 09-Apr-1986

## 2020-11-19 DIAGNOSIS — R519 Headache, unspecified: Secondary | ICD-10-CM | POA: Diagnosis not present

## 2020-11-19 DIAGNOSIS — R21 Rash and other nonspecific skin eruption: Secondary | ICD-10-CM | POA: Diagnosis not present

## 2020-11-19 DIAGNOSIS — T148XXA Other injury of unspecified body region, initial encounter: Secondary | ICD-10-CM | POA: Diagnosis not present

## 2020-11-21 ENCOUNTER — Ambulatory Visit: Payer: BC Managed Care – PPO | Admitting: Physical Therapy

## 2020-11-24 ENCOUNTER — Encounter: Payer: Self-pay | Admitting: Physical Therapy

## 2020-11-24 ENCOUNTER — Ambulatory Visit: Payer: BC Managed Care – PPO | Attending: Family Medicine | Admitting: Physical Therapy

## 2020-11-24 ENCOUNTER — Other Ambulatory Visit: Payer: Self-pay

## 2020-11-24 DIAGNOSIS — M5442 Lumbago with sciatica, left side: Secondary | ICD-10-CM | POA: Insufficient documentation

## 2020-11-24 DIAGNOSIS — R252 Cramp and spasm: Secondary | ICD-10-CM | POA: Diagnosis not present

## 2020-11-24 DIAGNOSIS — M5441 Lumbago with sciatica, right side: Secondary | ICD-10-CM | POA: Diagnosis not present

## 2020-11-24 DIAGNOSIS — M6281 Muscle weakness (generalized): Secondary | ICD-10-CM | POA: Insufficient documentation

## 2020-11-24 DIAGNOSIS — G8929 Other chronic pain: Secondary | ICD-10-CM | POA: Insufficient documentation

## 2020-11-24 NOTE — Therapy (Signed)
[facility]. [place], [place], [address] Phone: [phone]   Fax:  [phone]  Physical Therapy Treatment  Patient Details  Name: Philip Clark MRN: [id number] Date of Birth: Apr 18, 1986 Referring Provider (PT): Clinton Quant Date: 11/24/2020   PT End of Session - 11/24/20 1012     Visit Number 2    Date for PT Re-Evaluation 01/17/21    PT Start Time 0845    PT Stop Time 0925    PT Time Calculation (min) 40 min    Activity Tolerance Patient tolerated treatment well    Behavior During Therapy [facility] for tasks assessed/performed             Past Medical History:  Diagnosis Date   Allergy    Anxiety    Bronchitis     Past Surgical History:  Procedure Laterality Date   ANTERIOR CRUCIATE LIGAMENT (ACL) REVISION Left     There were no vitals filed for this visit.   Subjective Assessment - 11/24/20 0845     Subjective Pt reports he is doing well. Nothing has changed since the last visit he just feels tight. He has only done is home exercises a couple of times.    Currently in Pain? Yes    Pain Score 1     Pain Location Back                               [facility] Adult PT Treatment/Exercise - 11/24/20 0001       Lumbar Exercises: Stretches   Lower Trunk Rotation 2 reps;30 seconds    Piriformis Stretch Right;Left;1 rep;30 seconds      Lumbar Exercises: Aerobic   Recumbent Bike L2 x 6 min      Lumbar Exercises: Machines for Strengthening   Other Lumbar Machine Exercise Rows 2x10 25#    Other Lumbar Machine Exercise trunk ext 2x10      Lumbar Exercises: Seated   Sit to Stand 20 reps   OHP, Chest press 10#     Lumbar Exercises: Supine   Bridge Non-compliant   feet on orange pball, 1 set with hands off table x15   Straight Leg Raise 20 reps   5# with ABD   Other Supine Lumbar Exercises 2x10 90 knee toe tap      Lumbar Exercises: Prone   Other Prone Lumbar Exercises prone  press up x10   last rep held for 20 sec     Lumbar Exercises: Quadruped   [place]/Old Horse 10 reps    Opposite Arm/Leg Raise Right arm/Left leg;Left arm/Right leg;20 reps      Manual Therapy   Manual Therapy Passive ROM    Manual therapy comments LE's, L side tighter than right                      PT Short Term Goals - 11/24/20 1011       PT SHORT TERM GOAL #1   Title Pt will be I with initial HEP    Time 2    Period Weeks    Status On-going    Target Date 12/01/20               PT Long Term Goals - 11/17/20 1155       PT LONG TERM GOAL #1   Title Pt will be I with advanced HEP  Time 6    Period Weeks    Status New    Target Date 12/29/20      PT LONG TERM GOAL #2   Title Pt will report resolution of BLE radiating pain    Time 6    Period Weeks    Status New    Target Date 12/29/20      PT LONG TERM GOAL #3   Title Pt will report 50% reduction in LBP    Time 6    Period Weeks    Status New    Target Date 12/29/20      PT LONG TERM GOAL #4   Title Pt will demo lumbar flexion WFL with no increase in LBP    Time 6    Period Weeks    Status New    Target Date 12/29/20      PT LONG TERM GOAL #5   Title Pt will report able to perform all ADLs and work duties with no increase in LBP    Time 6    Period Weeks    Status New    Target Date 12/29/20                   Plan - 11/24/20 0932     Clinical Impression Statement Pt tolerated treatment well. He reports no radiating symptoms only tightness especially on the left side. PROM done to stretch LE's and decrease tightness. Pt reports he did feel looser following treatment. He reports he has only done his HEP a couple of times    PT Treatment/Interventions ADLs/Self Care Home Management;Electrical Stimulation;Iontophoresis 4mg /ml Dexamethasone;Moist Heat;Traction;Neuromuscular re-education;Therapeutic exercise;Therapeutic activities;Functional mobility training;Patient/family  education;Manual techniques;Dry needling;Passive range of motion    PT Next Visit Plan review/progress HEP, lumbar/core stab, LE flexibility, traction if indicated, manual/modalities as indicated, follow up on lyme disease screening    PT Home Exercise Plan see pt instructions             Patient will benefit from skilled therapeutic intervention in order to improve the following deficits and impairments:  Decreased range of motion, Increased muscle spasms, Pain, Hypomobility, Impaired flexibility, Decreased strength  Visit Diagnosis: Chronic bilateral low back pain with bilateral sciatica  Muscle weakness (generalized)  Cramp and spasm     Problem List Patient Active Problem List   Diagnosis Date Noted   Rib pain 10/19/2020   Lumbar radiculopathy 10/19/2020   Rib cage dysfunction 10/18/2020   Cauda equina injury without bone injury ([place]) 10/18/2020   Bronchitis    Anxiety    Allergy    S/P ACL reconstruction 07/16/2016   Injury of left knee 05/28/2016   Knee LCL sprain 11/24/2014    Dawayne Cirri, SPTA 11/24/2020, 10:13 AM  [place]. [place], [place], [address] Phone: [phone]   Fax:  [phone]  Name: Philip Clark MRN: [id number] Date of Birth: June 02, 1985

## 2020-11-28 ENCOUNTER — Ambulatory Visit: Payer: BC Managed Care – PPO | Admitting: Physical Therapy

## 2020-11-30 ENCOUNTER — Ambulatory Visit: Payer: BC Managed Care – PPO | Admitting: Physical Therapy

## 2020-12-05 ENCOUNTER — Ambulatory Visit: Payer: BC Managed Care – PPO | Admitting: Physical Therapy

## 2020-12-07 ENCOUNTER — Ambulatory Visit: Payer: BC Managed Care – PPO | Admitting: Physical Therapy

## 2020-12-14 ENCOUNTER — Ambulatory Visit: Payer: BC Managed Care – PPO | Admitting: Physical Therapy

## 2021-01-30 ENCOUNTER — Encounter: Payer: BC Managed Care – PPO | Admitting: Family Medicine

## 2021-01-30 ENCOUNTER — Other Ambulatory Visit: Payer: Self-pay

## 2021-04-17 ENCOUNTER — Other Ambulatory Visit: Payer: Self-pay

## 2021-04-17 ENCOUNTER — Encounter: Payer: Self-pay | Admitting: Family Medicine

## 2021-04-17 ENCOUNTER — Ambulatory Visit: Payer: BC Managed Care – PPO | Admitting: Family Medicine

## 2021-04-17 VITALS — BP 124/76 | HR 114 | Temp 97.8°F | Ht 74.0 in | Wt 240.8 lb

## 2021-04-17 DIAGNOSIS — R000 Tachycardia, unspecified: Secondary | ICD-10-CM

## 2021-04-17 DIAGNOSIS — B349 Viral infection, unspecified: Secondary | ICD-10-CM

## 2021-04-17 NOTE — Progress Notes (Signed)
Established Patient Office Visit  Subjective:  Patient ID: Philip Clark, male    DOB: 01/08/1986  Age: 35 y.o. MRN: [id number]  CC:  Chief Complaint  Patient presents with   Cough    Cough, runny nose symptoms x 3 days.     HPI Philip Clark presents for treatment and evaluation of a 3-day history of URI signs and symptoms with elevated temperature to 100, headache, nasal congestion, postnasal drip, cough, and mild myalgias and arthralgias.  He has no asthma history and is having no trouble breathing.  Denies wheezing.  No history of tobacco use.  Has had contact with the flu.  Has not had flu or COVID vaccines.  Experienced COVID back in August.  He does not smoke or use illicit drugs.  He has cut back on his caffeine intake.  Pulse rate at home is around 75.  Status post recent negative CT cardiac scan.  He experiences no shortness of breath chest pain  Past Medical History:  Diagnosis Date   Allergy    Anxiety    Bronchitis     Past Surgical History:  Procedure Laterality Date   ANTERIOR CRUCIATE LIGAMENT (ACL) REVISION Left     Family History  Problem Relation Age of Onset   Anxiety disorder Mother    Heart disease Father 40       MI x 2   Dementia Maternal Grandmother    Heart disease Maternal Grandfather 16       CAD with CABG   Early death Paternal Grandfather 55       CAD/MI    Social History   Socioeconomic History   Marital status: Married    Spouse name: Not on file   Number of children: Not on file   Years of education: Not on file   Highest education level: Not on file  Occupational History   Not on file  Tobacco Use   Smoking status: Never   Smokeless tobacco: Never  Vaping Use   Vaping Use: Never used  Substance and Sexual Activity   Alcohol use: Yes    Alcohol/week: 1.0 standard drink    Types: 1 Cans of beer per week    Comment: occ   Drug use: No   Sexual activity: Yes  Other Topics Concern   Not on file  Social History Narrative    Not on file   Social Determinants of Health   Financial Resource Strain: Not on file  Food Insecurity: Not on file  Transportation Needs: Not on file  Physical Activity: Not on file  Stress: Not on file  Social Connections: Not on file  Intimate Partner Violence: Not on file    Outpatient Medications Prior to Visit  Medication Sig Dispense Refill   methocarbamol (ROBAXIN) 500 MG tablet Take 1 tablet (500 mg total) by mouth every 8 (eight) hours as needed for muscle spasms. 60 tablet 0   topiramate (TOPAMAX) 200 MG tablet Take 1 tablet (200 mg total) by mouth 2 (two) times daily. 60 tablet 0   No facility-administered medications prior to visit.    No Known Allergies  ROS Review of Systems  Constitutional:  Positive for fatigue. Negative for chills, diaphoresis, fever and unexpected weight change.  HENT:  Positive for congestion, postnasal drip, rhinorrhea and sore throat. Negative for trouble swallowing and voice change.   Eyes:  Negative for photophobia.  Respiratory:  Positive for cough. Negative for shortness of breath and wheezing.   Cardiovascular:  Negative for chest pain and palpitations.  Gastrointestinal:  Negative for nausea and vomiting.  Endocrine: Negative for polyphagia and polyuria.  Genitourinary: Negative.   Musculoskeletal:  Positive for arthralgias and myalgias.  Neurological:  Positive for headaches. Negative for weakness.  Psychiatric/Behavioral: Negative.       Objective:    Physical Exam Vitals and nursing note reviewed.  Constitutional:      General: He is not in acute distress.    Appearance: Normal appearance. He is not ill-appearing, toxic-appearing or diaphoretic.  HENT:     Head: Normocephalic and atraumatic.     Right Ear: Tympanic membrane, ear canal and external ear normal.     Left Ear: Tympanic membrane, ear canal and external ear normal.     Mouth/Throat:     Mouth: Mucous membranes are moist.     Pharynx: Oropharynx is clear.  No oropharyngeal exudate or posterior oropharyngeal erythema.  Eyes:     General: No scleral icterus.       Right eye: No discharge.        Left eye: No discharge.     Extraocular Movements: Extraocular movements intact.     Conjunctiva/sclera: Conjunctivae normal.     Pupils: Pupils are equal, round, and reactive to light.  Cardiovascular:     Rate and Rhythm: Tachycardia present.  Pulmonary:     Effort: Pulmonary effort is normal. No respiratory distress.     Breath sounds: Normal breath sounds. No wheezing or rales.  Abdominal:     General: Bowel sounds are normal.  Musculoskeletal:     Cervical back: No rigidity or tenderness.  Lymphadenopathy:     Cervical: No cervical adenopathy.  Skin:    General: Skin is warm and dry.  Neurological:     Mental Status: He is alert and oriented to person, place, and time.  Psychiatric:        Mood and Affect: Mood normal.        Behavior: Behavior normal.    BP 124/76 (BP Location: Right Arm, Patient Position: Sitting, Cuff Size: Large)   Pulse (!) 114   Temp 97.8 F (36.6 C) (Temporal)   Ht 6\' 2"  (1.88 m)   Wt 240 lb 12.8 oz (109.2 kg)   SpO2 98%   BMI 30.92 kg/m  Wt Readings from Last 3 Encounters:  04/17/21 240 lb 12.8 oz (109.2 kg)  11/15/20 237 lb (107.5 kg)  10/19/20 243 lb (110.2 kg)     Health Maintenance Due  Topic Date Due   HIV Screening  Never done   Hepatitis C Screening  Never done    There are no preventive care reminders to display for this patient.  Lab Results  Component Value Date   TSH 2.30 02/01/2020   Lab Results  Component Value Date   WBC 7.7 03/17/2020   HGB 13.9 03/17/2020   HCT 41.0 03/17/2020   MCV 88.3 03/17/2020   PLT 305 03/17/2020   Lab Results  Component Value Date   NA 141 03/17/2020   K 4.0 03/17/2020   CO2 26 03/17/2020   GLUCOSE 96 03/17/2020   BUN 14 03/17/2020   CREATININE 1.20 03/17/2020   BILITOT 0.9 02/01/2020   ALKPHOS 52 02/01/2020   AST 32 02/01/2020   ALT 25  02/01/2020   PROT 7.5 02/01/2020   ALBUMIN 4.5 02/01/2020   CALCIUM 9.2 03/17/2020   ANIONGAP 9 03/17/2020   GFR 80.74 02/01/2020   Lab Results  Component Value Date  CHOL 155 02/01/2020   Lab Results  Component Value Date   HDL 40.90 02/01/2020   Lab Results  Component Value Date   LDLCALC 100 (H) 02/01/2020   Lab Results  Component Value Date   TRIG 73.0 02/01/2020   Lab Results  Component Value Date   CHOLHDL 4 02/01/2020   No results found for: HGBA1C    Assessment & Plan:   Problem List Items Addressed This Visit       Other   Elevated pulse rate   Viral syndrome - Primary   Relevant Orders   COVID-19, Flu A+B and RSV    No orders of the defined types were placed in this encounter.   Follow-up: Return in about 1 week (around 04/24/2021), or if symptoms worsen or fail to improve.  We discussed symptomatic treatment for viral illness.  Follow-up in 1 week if not improving.  We will continue to follow elevated heart rate.  Libby Maw, MD

## 2021-04-19 LAB — COVID-19, FLU A+B AND RSV
Influenza A, NAA: DETECTED — AB
Influenza B, NAA: NOT DETECTED
RSV, NAA: NOT DETECTED
SARS-CoV-2, NAA: NOT DETECTED

## 2021-05-16 DIAGNOSIS — M5116 Intervertebral disc disorders with radiculopathy, lumbar region: Secondary | ICD-10-CM | POA: Diagnosis not present

## 2021-05-16 DIAGNOSIS — M9905 Segmental and somatic dysfunction of pelvic region: Secondary | ICD-10-CM | POA: Diagnosis not present

## 2021-05-16 DIAGNOSIS — M25552 Pain in left hip: Secondary | ICD-10-CM | POA: Diagnosis not present

## 2021-05-16 DIAGNOSIS — M9903 Segmental and somatic dysfunction of lumbar region: Secondary | ICD-10-CM | POA: Diagnosis not present

## 2021-05-22 DIAGNOSIS — M25552 Pain in left hip: Secondary | ICD-10-CM | POA: Diagnosis not present

## 2021-05-22 DIAGNOSIS — M9905 Segmental and somatic dysfunction of pelvic region: Secondary | ICD-10-CM | POA: Diagnosis not present

## 2021-05-22 DIAGNOSIS — M5116 Intervertebral disc disorders with radiculopathy, lumbar region: Secondary | ICD-10-CM | POA: Diagnosis not present

## 2021-05-22 DIAGNOSIS — M9903 Segmental and somatic dysfunction of lumbar region: Secondary | ICD-10-CM | POA: Diagnosis not present

## 2021-05-23 DIAGNOSIS — M9905 Segmental and somatic dysfunction of pelvic region: Secondary | ICD-10-CM | POA: Diagnosis not present

## 2021-05-23 DIAGNOSIS — M47816 Spondylosis without myelopathy or radiculopathy, lumbar region: Secondary | ICD-10-CM | POA: Diagnosis not present

## 2021-05-23 DIAGNOSIS — M25552 Pain in left hip: Secondary | ICD-10-CM | POA: Diagnosis not present

## 2021-05-23 DIAGNOSIS — M5116 Intervertebral disc disorders with radiculopathy, lumbar region: Secondary | ICD-10-CM | POA: Diagnosis not present

## 2021-05-23 DIAGNOSIS — M9903 Segmental and somatic dysfunction of lumbar region: Secondary | ICD-10-CM | POA: Diagnosis not present

## 2021-05-29 DIAGNOSIS — M9903 Segmental and somatic dysfunction of lumbar region: Secondary | ICD-10-CM | POA: Diagnosis not present

## 2021-05-29 DIAGNOSIS — M9905 Segmental and somatic dysfunction of pelvic region: Secondary | ICD-10-CM | POA: Diagnosis not present

## 2021-05-29 DIAGNOSIS — M5116 Intervertebral disc disorders with radiculopathy, lumbar region: Secondary | ICD-10-CM | POA: Diagnosis not present

## 2021-05-29 DIAGNOSIS — M25552 Pain in left hip: Secondary | ICD-10-CM | POA: Diagnosis not present

## 2021-05-31 DIAGNOSIS — M5116 Intervertebral disc disorders with radiculopathy, lumbar region: Secondary | ICD-10-CM | POA: Diagnosis not present

## 2021-05-31 DIAGNOSIS — M9903 Segmental and somatic dysfunction of lumbar region: Secondary | ICD-10-CM | POA: Diagnosis not present

## 2021-05-31 DIAGNOSIS — M25552 Pain in left hip: Secondary | ICD-10-CM | POA: Diagnosis not present

## 2021-05-31 DIAGNOSIS — M9905 Segmental and somatic dysfunction of pelvic region: Secondary | ICD-10-CM | POA: Diagnosis not present

## 2021-06-05 DIAGNOSIS — M5116 Intervertebral disc disorders with radiculopathy, lumbar region: Secondary | ICD-10-CM | POA: Diagnosis not present

## 2021-06-05 DIAGNOSIS — M9903 Segmental and somatic dysfunction of lumbar region: Secondary | ICD-10-CM | POA: Diagnosis not present

## 2021-06-05 DIAGNOSIS — M25552 Pain in left hip: Secondary | ICD-10-CM | POA: Diagnosis not present

## 2021-06-05 DIAGNOSIS — M9905 Segmental and somatic dysfunction of pelvic region: Secondary | ICD-10-CM | POA: Diagnosis not present

## 2021-06-06 ENCOUNTER — Encounter: Payer: Self-pay | Admitting: Family Medicine

## 2021-06-07 DIAGNOSIS — M9905 Segmental and somatic dysfunction of pelvic region: Secondary | ICD-10-CM | POA: Diagnosis not present

## 2021-06-07 DIAGNOSIS — M25552 Pain in left hip: Secondary | ICD-10-CM | POA: Diagnosis not present

## 2021-06-07 DIAGNOSIS — M5116 Intervertebral disc disorders with radiculopathy, lumbar region: Secondary | ICD-10-CM | POA: Diagnosis not present

## 2021-06-07 DIAGNOSIS — M9903 Segmental and somatic dysfunction of lumbar region: Secondary | ICD-10-CM | POA: Diagnosis not present

## 2021-06-12 DIAGNOSIS — M9905 Segmental and somatic dysfunction of pelvic region: Secondary | ICD-10-CM | POA: Diagnosis not present

## 2021-06-12 DIAGNOSIS — M25552 Pain in left hip: Secondary | ICD-10-CM | POA: Diagnosis not present

## 2021-06-12 DIAGNOSIS — M9903 Segmental and somatic dysfunction of lumbar region: Secondary | ICD-10-CM | POA: Diagnosis not present

## 2021-06-12 DIAGNOSIS — M5116 Intervertebral disc disorders with radiculopathy, lumbar region: Secondary | ICD-10-CM | POA: Diagnosis not present

## 2021-06-14 DIAGNOSIS — M9903 Segmental and somatic dysfunction of lumbar region: Secondary | ICD-10-CM | POA: Diagnosis not present

## 2021-06-14 DIAGNOSIS — M25552 Pain in left hip: Secondary | ICD-10-CM | POA: Diagnosis not present

## 2021-06-14 DIAGNOSIS — M9905 Segmental and somatic dysfunction of pelvic region: Secondary | ICD-10-CM | POA: Diagnosis not present

## 2021-06-14 DIAGNOSIS — M5116 Intervertebral disc disorders with radiculopathy, lumbar region: Secondary | ICD-10-CM | POA: Diagnosis not present

## 2021-06-18 DIAGNOSIS — K6289 Other specified diseases of anus and rectum: Secondary | ICD-10-CM | POA: Diagnosis not present

## 2021-06-19 DIAGNOSIS — M9905 Segmental and somatic dysfunction of pelvic region: Secondary | ICD-10-CM | POA: Diagnosis not present

## 2021-06-19 DIAGNOSIS — M9903 Segmental and somatic dysfunction of lumbar region: Secondary | ICD-10-CM | POA: Diagnosis not present

## 2021-06-19 DIAGNOSIS — M5116 Intervertebral disc disorders with radiculopathy, lumbar region: Secondary | ICD-10-CM | POA: Diagnosis not present

## 2021-06-19 DIAGNOSIS — M25552 Pain in left hip: Secondary | ICD-10-CM | POA: Diagnosis not present

## 2021-06-21 DIAGNOSIS — M9905 Segmental and somatic dysfunction of pelvic region: Secondary | ICD-10-CM | POA: Diagnosis not present

## 2021-06-21 DIAGNOSIS — M25552 Pain in left hip: Secondary | ICD-10-CM | POA: Diagnosis not present

## 2021-06-21 DIAGNOSIS — M9903 Segmental and somatic dysfunction of lumbar region: Secondary | ICD-10-CM | POA: Diagnosis not present

## 2021-06-21 DIAGNOSIS — M5116 Intervertebral disc disorders with radiculopathy, lumbar region: Secondary | ICD-10-CM | POA: Diagnosis not present

## 2021-06-26 DIAGNOSIS — M9903 Segmental and somatic dysfunction of lumbar region: Secondary | ICD-10-CM | POA: Diagnosis not present

## 2021-06-26 DIAGNOSIS — M25552 Pain in left hip: Secondary | ICD-10-CM | POA: Diagnosis not present

## 2021-06-26 DIAGNOSIS — M9905 Segmental and somatic dysfunction of pelvic region: Secondary | ICD-10-CM | POA: Diagnosis not present

## 2021-06-26 DIAGNOSIS — M5116 Intervertebral disc disorders with radiculopathy, lumbar region: Secondary | ICD-10-CM | POA: Diagnosis not present

## 2021-06-28 ENCOUNTER — Telehealth: Payer: Self-pay | Admitting: Family Medicine

## 2021-06-28 DIAGNOSIS — M9905 Segmental and somatic dysfunction of pelvic region: Secondary | ICD-10-CM | POA: Diagnosis not present

## 2021-06-28 DIAGNOSIS — M9903 Segmental and somatic dysfunction of lumbar region: Secondary | ICD-10-CM | POA: Diagnosis not present

## 2021-06-28 DIAGNOSIS — M5116 Intervertebral disc disorders with radiculopathy, lumbar region: Secondary | ICD-10-CM | POA: Diagnosis not present

## 2021-06-28 DIAGNOSIS — M25552 Pain in left hip: Secondary | ICD-10-CM | POA: Diagnosis not present

## 2021-06-28 NOTE — Telephone Encounter (Signed)
This call is in reference to the Pt Message sent on 06/06/21 through [facility].  He is wanting a referral for a colonoscopy. I offered him an appointment for blood in his colon, he declined. Please advise pt at [phone].

## 2021-06-28 NOTE — Telephone Encounter (Signed)
Appointment needed and scheduled.

## 2021-07-02 ENCOUNTER — Encounter: Payer: Self-pay | Admitting: Family Medicine

## 2021-07-02 ENCOUNTER — Ambulatory Visit: Payer: BC Managed Care – PPO | Admitting: Family Medicine

## 2021-07-02 ENCOUNTER — Other Ambulatory Visit: Payer: Self-pay

## 2021-07-02 VITALS — BP 124/76 | HR 100 | Temp 97.7°F | Ht 74.0 in | Wt 241.2 lb

## 2021-07-02 DIAGNOSIS — R195 Other fecal abnormalities: Secondary | ICD-10-CM | POA: Insufficient documentation

## 2021-07-02 DIAGNOSIS — R1030 Lower abdominal pain, unspecified: Secondary | ICD-10-CM | POA: Diagnosis not present

## 2021-07-02 DIAGNOSIS — M5116 Intervertebral disc disorders with radiculopathy, lumbar region: Secondary | ICD-10-CM | POA: Diagnosis not present

## 2021-07-02 DIAGNOSIS — M9905 Segmental and somatic dysfunction of pelvic region: Secondary | ICD-10-CM | POA: Diagnosis not present

## 2021-07-02 DIAGNOSIS — D649 Anemia, unspecified: Secondary | ICD-10-CM | POA: Diagnosis not present

## 2021-07-02 DIAGNOSIS — M9903 Segmental and somatic dysfunction of lumbar region: Secondary | ICD-10-CM | POA: Diagnosis not present

## 2021-07-02 DIAGNOSIS — K921 Melena: Secondary | ICD-10-CM | POA: Diagnosis not present

## 2021-07-02 DIAGNOSIS — M25552 Pain in left hip: Secondary | ICD-10-CM | POA: Diagnosis not present

## 2021-07-02 NOTE — Progress Notes (Signed)
Established Patient Office Visit  Subjective:  Patient ID: Philip Clark, male    DOB: 1986-04-15  Age: 36 y.o. MRN: [id number]  CC:  Chief Complaint  Patient presents with   Blood In Stools    Concerns about blood in stool would like referral for colonoscopy. Per pt hemoglobin dropped from 14 to 12.9 at well visit with work would like to discuss this. Feeling very tired and cold a lot.     HPI Philip Clark presents for evaluation of blood in his stool over the last month or so.  There have been no night sweats or weight loss.  Denies painful bowel movements.  Denies nausea and vomiting.  Has experienced mild bilateral lower abdominal pain.  No family history of colon disease in early age.  No family history of inflammatory bowel.  Recently seen by general surgery and diagnosed with proctitis.  He is a Airline pilot.  Recent labs on January 15 showed a mild decrease in hemoglobin.  Normocytic.  CMP, TSH and lipid profile were all normal.  Past Medical History:  Diagnosis Date   Allergy    Anxiety    Bronchitis     Past Surgical History:  Procedure Laterality Date   ANTERIOR CRUCIATE LIGAMENT (ACL) REVISION Left     Family History  Problem Relation Age of Onset   Anxiety disorder Mother    Heart disease Father 30       MI x 2   Dementia Maternal Grandmother    Heart disease Maternal Grandfather 17       CAD with CABG   Early death Paternal Grandfather 53       CAD/MI    Social History   Socioeconomic History   Marital status: Married    Spouse name: Not on file   Number of children: Not on file   Years of education: Not on file   Highest education level: Not on file  Occupational History   Not on file  Tobacco Use   Smoking status: Never   Smokeless tobacco: Never  Vaping Use   Vaping Use: Never used  Substance and Sexual Activity   Alcohol use: Yes    Alcohol/week: 1.0 standard drink    Types: 1 Cans of beer per week    Comment: occ   Drug use: No    Sexual activity: Yes  Other Topics Concern   Not on file  Social History Narrative   Not on file   Social Determinants of Health   Financial Resource Strain: Not on file  Food Insecurity: Not on file  Transportation Needs: Not on file  Physical Activity: Not on file  Stress: Not on file  Social Connections: Not on file  Intimate Partner Violence: Not on file    No outpatient medications prior to visit.   No facility-administered medications prior to visit.    No Known Allergies  ROS Review of Systems  Constitutional:  Negative for chills, diaphoresis, fatigue, fever and unexpected weight change.  Respiratory: Negative.    Cardiovascular: Negative.   Gastrointestinal:  Positive for abdominal pain and blood in stool. Negative for abdominal distention, constipation and diarrhea.  Genitourinary: Negative.   Musculoskeletal: Negative.   Neurological:  Negative for speech difficulty and weakness.     Objective:    Physical Exam Vitals and nursing note reviewed.  Constitutional:      General: He is not in acute distress.    Appearance: Normal appearance. He is not ill-appearing, toxic-appearing or diaphoretic.  HENT:     Head: Normocephalic and atraumatic.     Right Ear: External ear normal.     Left Ear: External ear normal.     Mouth/Throat:     Mouth: Mucous membranes are moist.     Pharynx: Oropharynx is clear. No oropharyngeal exudate or posterior oropharyngeal erythema.  Eyes:     General: No scleral icterus.       Right eye: No discharge.        Left eye: No discharge.     Extraocular Movements: Extraocular movements intact.     Conjunctiva/sclera: Conjunctivae normal.     Pupils: Pupils are equal, round, and reactive to light.  Cardiovascular:     Rate and Rhythm: Normal rate and regular rhythm.  Pulmonary:     Effort: Pulmonary effort is normal.     Breath sounds: Normal breath sounds.  Abdominal:     General: Abdomen is flat. Bowel sounds are normal.      Hernia: There is no hernia in the left inguinal area, right femoral area, left femoral area or right inguinal area.  Genitourinary:    Penis: Circumcised. No hypospadias, erythema, tenderness, discharge, swelling or lesions.      Testes:        Right: Mass, tenderness or swelling not present. Right testis is descended.        Left: Mass, tenderness or swelling not present. Left testis is descended.     Epididymis:     Right: Not inflamed or enlarged.     Left: Not inflamed or enlarged.     Prostate: Not enlarged, not tender and no nodules present.     Rectum: Guaiac result positive. No mass, tenderness, anal fissure, external hemorrhoid or internal hemorrhoid. Normal anal tone.  Lymphadenopathy:     Lower Body: No right inguinal adenopathy. No left inguinal adenopathy.  Skin:    General: Skin is warm and dry.  Neurological:     Mental Status: He is alert and oriented to person, place, and time.  Psychiatric:        Mood and Affect: Mood normal.        Behavior: Behavior normal.    BP 124/76 (BP Location: Right Arm, Patient Position: Sitting, Cuff Size: Large)    Pulse 100    Temp 97.7 F (36.5 C) (Temporal)    Ht 6\' 2"  (1.88 m)    Wt 241 lb 3.2 oz (109.4 kg)    SpO2 99%    BMI 30.97 kg/m  Wt Readings from Last 3 Encounters:  07/02/21 241 lb 3.2 oz (109.4 kg)  04/17/21 240 lb 12.8 oz (109.2 kg)  11/15/20 237 lb (107.5 kg)     Health Maintenance Due  Topic Date Due   HIV Screening  Never done   Hepatitis C Screening  Never done    There are no preventive care reminders to display for this patient.  Lab Results  Component Value Date   TSH 2.30 02/01/2020   Lab Results  Component Value Date   WBC 7.7 03/17/2020   HGB 13.9 03/17/2020   HCT 41.0 03/17/2020   MCV 88.3 03/17/2020   PLT 305 03/17/2020   Lab Results  Component Value Date   NA 141 03/17/2020   K 4.0 03/17/2020   CO2 26 03/17/2020   GLUCOSE 96 03/17/2020   BUN 14 03/17/2020   CREATININE 1.20  03/17/2020   BILITOT 0.9 02/01/2020   ALKPHOS 52 02/01/2020   AST 32 02/01/2020  ALT 25 02/01/2020   PROT 7.5 02/01/2020   ALBUMIN 4.5 02/01/2020   CALCIUM 9.2 03/17/2020   ANIONGAP 9 03/17/2020   GFR 80.74 02/01/2020   Lab Results  Component Value Date   CHOL 155 02/01/2020   Lab Results  Component Value Date   HDL 40.90 02/01/2020   Lab Results  Component Value Date   LDLCALC 100 (H) 02/01/2020   Lab Results  Component Value Date   TRIG 73.0 02/01/2020   Lab Results  Component Value Date   CHOLHDL 4 02/01/2020   No results found for: HGBA1C    Assessment & Plan:   Problem List Items Addressed This Visit       Other   Heme positive stool - Primary   Relevant Orders   CBC   Ambulatory referral to Gastroenterology   Hematochezia   Relevant Orders   CBC   Ambulatory referral to Gastroenterology   Lower abdominal pain   Relevant Orders   Ambulatory referral to Gastroenterology   Anemia   Relevant Orders   CBC   B12 and Folate Panel   Iron, TIBC and Ferritin Panel    No orders of the defined types were placed in this encounter.   Follow-up: Return in about 3 months (around 09/29/2021), or if symptoms worsen or fail to improve.   Consider proctitis, colitis, IBD.  Referral for colonoscopy. Libby Maw, MD

## 2021-07-04 ENCOUNTER — Other Ambulatory Visit: Payer: BC Managed Care – PPO

## 2021-07-09 DIAGNOSIS — M9905 Segmental and somatic dysfunction of pelvic region: Secondary | ICD-10-CM | POA: Diagnosis not present

## 2021-07-09 DIAGNOSIS — M9903 Segmental and somatic dysfunction of lumbar region: Secondary | ICD-10-CM | POA: Diagnosis not present

## 2021-07-09 DIAGNOSIS — M5116 Intervertebral disc disorders with radiculopathy, lumbar region: Secondary | ICD-10-CM | POA: Diagnosis not present

## 2021-07-09 DIAGNOSIS — M25552 Pain in left hip: Secondary | ICD-10-CM | POA: Diagnosis not present

## 2021-07-10 ENCOUNTER — Encounter: Payer: Self-pay | Admitting: Gastroenterology

## 2021-07-16 DIAGNOSIS — M5116 Intervertebral disc disorders with radiculopathy, lumbar region: Secondary | ICD-10-CM | POA: Diagnosis not present

## 2021-07-16 DIAGNOSIS — M25552 Pain in left hip: Secondary | ICD-10-CM | POA: Diagnosis not present

## 2021-07-16 DIAGNOSIS — M9903 Segmental and somatic dysfunction of lumbar region: Secondary | ICD-10-CM | POA: Diagnosis not present

## 2021-07-16 DIAGNOSIS — M9905 Segmental and somatic dysfunction of pelvic region: Secondary | ICD-10-CM | POA: Diagnosis not present

## 2021-07-25 DIAGNOSIS — M9903 Segmental and somatic dysfunction of lumbar region: Secondary | ICD-10-CM | POA: Diagnosis not present

## 2021-07-25 DIAGNOSIS — M5116 Intervertebral disc disorders with radiculopathy, lumbar region: Secondary | ICD-10-CM | POA: Diagnosis not present

## 2021-07-25 DIAGNOSIS — M9905 Segmental and somatic dysfunction of pelvic region: Secondary | ICD-10-CM | POA: Diagnosis not present

## 2021-07-25 DIAGNOSIS — M25552 Pain in left hip: Secondary | ICD-10-CM | POA: Diagnosis not present

## 2021-08-01 ENCOUNTER — Ambulatory Visit (INDEPENDENT_AMBULATORY_CARE_PROVIDER_SITE_OTHER): Payer: BC Managed Care – PPO | Admitting: Gastroenterology

## 2021-08-01 ENCOUNTER — Other Ambulatory Visit (INDEPENDENT_AMBULATORY_CARE_PROVIDER_SITE_OTHER): Payer: BC Managed Care – PPO

## 2021-08-01 ENCOUNTER — Encounter: Payer: Self-pay | Admitting: Gastroenterology

## 2021-08-01 ENCOUNTER — Other Ambulatory Visit: Payer: Self-pay

## 2021-08-01 VITALS — BP 118/78 | HR 111 | Ht 74.0 in | Wt 247.0 lb

## 2021-08-01 DIAGNOSIS — D649 Anemia, unspecified: Secondary | ICD-10-CM | POA: Diagnosis not present

## 2021-08-01 DIAGNOSIS — K921 Melena: Secondary | ICD-10-CM

## 2021-08-01 NOTE — Addendum Note (Signed)
Addended by: Curlene Labrum E on: 08/01/2021 02:49 PM ? ? Modules accepted: Orders ? ?

## 2021-08-01 NOTE — Patient Instructions (Addendum)
If you are age 36 or older, your body mass index should be between 23-30. Your Body mass index is 31.71 kg/m?Marland Kitchen If this is out of the aforementioned range listed, please consider follow up with your Primary Care Provider. ? ?If you are age 38 or younger, your body mass index should be between 19-25. Your Body mass index is 31.71 kg/m?Marland Kitchen If this is out of the aformentioned range listed, please consider follow up with your Primary Care Provider.  ? ?________________________________________________________ ? ?The [facility] providers would like to encourage you to use [facility] to communicate with providers for non-urgent requests or questions.  Due to long hold times on the telephone, sending your provider a message by [facility] may be a faster and more efficient way to get a response.  Please allow 48 business hours for a response.  Please remember that this is for non-urgent requests.  ?_______________________________________________________ ? ?Please go to the lab on the 2nd floor [address] before you leave the office today.  ? ?You have been scheduled for a colonoscopy. Please follow written instructions given to you at your visit today.  ?Please pick up your prep supplies at the pharmacy within the next 1-3 days. ?If you use inhalers (even only as needed), please bring them with you on the day of your procedure. ? ?We have given you samples of the following medication to take: ?Clenpiq ? ?Please call with any questions or concerns. ? ?It was a pleasure to see you today! ? ?Gerrit Heck, D.O. ? ?

## 2021-08-01 NOTE — Progress Notes (Signed)
? ? ?          ?Chief Complaint: Hematochezia ? ? ?Referring Provider:     Libby Maw, MD ; Leighton Ruff, MD ? ? ?HPI:   ? ? 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 ? ?He has been having intermittent BRBPR for the last 6 months  or so.  Described as BRB coating on stool and tissue paper. Can have mucus-like substance as well at times. No abdominal pain, n/v/f/c.  ? ?Has L4-5 compression and had been having straining to have BM. During recent fire department physical was noted to have Hgb 12.9 (previously 14).  ? ?Was seen by Dr. Marcello Moores at [facility] on 06/18/2021 for evaluation of symptomatic hemorrhoids (hematochezia, anemia on physical for the fire department).  Exam with grade 1 internal hemorrhoids and mild anal inflammation on anoscopy and was prescribed Anusol suppositories (never took these).  Recommended referral for colonoscopy. ? ?Was then seen by Dr. Ethelene Hal on 07/02/2021 and sent for CBC, iron panel; not yet completed.  ? ?No previous EGD or colonoscopy. ? ?PGM with Colon CA in her 11s. Otherwise, no known family history of  IBD.  ? ? ?Past Medical History:  ?Diagnosis Date  ? Allergy   ? Anxiety   ? Bronchitis   ? ? ? ?Past Surgical History:  ?Procedure Laterality Date  ? ANTERIOR CRUCIATE LIGAMENT (ACL) REVISION Left   ? ?Family History  ?Problem Relation Age of Onset  ? Anxiety disorder Mother   ? Heart disease Father 55  ?     MI x 2  ? Dementia Maternal Grandmother   ? Heart disease Maternal Grandfather 81  ?     CAD with CABG  ? Bladder Cancer Maternal Grandfather   ? Colon cancer Paternal Grandmother 49  ? Early death Paternal Grandfather 64  ?     CAD/MI  ? Rectal cancer Neg Hx   ? Stomach cancer Neg Hx   ? Esophageal cancer Neg Hx   ? ?Social History  ? ?Tobacco Use  ? Smoking status: Never  ? Smokeless tobacco: Never  ?Vaping Use  ? Vaping Use: Never used  ?Substance Use Topics  ? Alcohol use:  Yes  ?  Alcohol/week: 1.0 standard drink  ?  Types: 1 Cans of beer per week  ?  Comment: occ  ? Drug use: No  ? ?No current outpatient medications on file.  ? ?No current facility-administered medications for this visit.  ? ?No Known Allergies ? ? ?Review of Systems: ?All systems reviewed and negative except where noted in HPI.  ? ? ? ?Physical Exam:   ? ?Wt Readings from Last 3 Encounters:  ?08/01/21 247 lb (112 kg)  ?07/02/21 241 lb 3.2 oz (109.4 kg)  ?04/17/21 240 lb 12.8 oz (109.2 kg)  ? ? ?BP 118/78   Pulse (!) 111   Ht '6\' 2"'$  (1.88 m)   Wt 247 lb (112 kg)   SpO2 98%   BMI 31.71 kg/m?  ?Constitutional:  Pleasant, in no acute distress. ?Psychiatric: Normal mood and affect. Behavior is normal. ?Cardiovascular: Normal rate, regular rhythm. No edema ?Pulmonary/chest: Effort normal and breath sounds normal. No wheezing, rales or rhonchi. ?Abdominal: Soft, nondistended, nontender. Bowel sounds active throughout. There are no masses palpable. No hepatomegaly. ?Neurological: Alert and oriented to person place and time. ?Skin: Skin is warm and dry. No rashes noted. ?Rectal: Exam deferred to time of colonoscopy.  ? ? ?  ASSESSMENT AND PLAN;  ? ?1) Hematochezia ?2) Anemia ?- Colonoscopy to evaluate for etiology ?- If clinically significant hemorrhoids as sole etiology for hematochezia, discussed plan for hemorrhoid band ligation ?- Check labs here today; CBC, iron panel, B12, folate ? ?The indications, risks, and benefits of colonoscopy were explained to the patient in detail. Risks include but are not limited to bleeding, perforation, adverse reaction to medications, and cardiopulmonary compromise. Sequelae include but are not limited to the possibility of surgery, hospitalization, and mortality. The patient verbalized understanding and wished to proceed. All questions answered, referred to the scheduler and bowel prep ordered. Further recommendations pending results of the exam.  ? ? ?Lavena Bullion, DO, FACG   08/01/2021, 2:18 PM ? ? ?Libby Maw,* ? ?

## 2021-08-02 LAB — IRON, TOTAL/TOTAL IRON BINDING CAP
%SAT: 15 % (calc) — ABNORMAL LOW (ref 20–48)
Iron: 62 ug/dL (ref 50–180)
TIBC: 411 mcg/dL (calc) (ref 250–425)

## 2021-08-02 LAB — FOLATE: Folate: 15.1 ng/mL (ref >5.9)

## 2021-08-02 LAB — CBC
HCT: 35.7 % — ABNORMAL LOW (ref 39.0–52.0)
Hemoglobin: 11.8 g/dL — ABNORMAL LOW (ref 13.0–17.0)
MCHC: 33.1 g/dL (ref 30.0–36.0)
MCV: 83.7 fl (ref 78.0–100.0)
Platelets: 311 10*3/uL (ref 150.0–400.0)
RBC: 4.26 Mil/uL (ref 4.22–5.81)
RDW: 14.4 % (ref 11.5–15.5)
WBC: 9.5 10*3/uL (ref 4.0–10.5)

## 2021-08-02 LAB — VITAMIN B12: Vitamin B-12: 302 pg/mL (ref 211–911)

## 2021-08-02 LAB — FERRITIN: Ferritin: 6.4 ng/mL — ABNORMAL LOW (ref 22.0–322.0)

## 2021-08-13 DIAGNOSIS — D509 Iron deficiency anemia, unspecified: Secondary | ICD-10-CM | POA: Insufficient documentation

## 2021-08-15 ENCOUNTER — Other Ambulatory Visit: Payer: Self-pay

## 2021-08-15 DIAGNOSIS — D649 Anemia, unspecified: Secondary | ICD-10-CM

## 2021-08-15 DIAGNOSIS — K921 Melena: Secondary | ICD-10-CM

## 2021-09-06 ENCOUNTER — Encounter: Payer: BC Managed Care – PPO | Admitting: Gastroenterology

## 2021-09-11 ENCOUNTER — Ambulatory Visit (AMBULATORY_SURGERY_CENTER): Payer: BC Managed Care – PPO | Admitting: Gastroenterology

## 2021-09-11 ENCOUNTER — Other Ambulatory Visit: Payer: BC Managed Care – PPO

## 2021-09-11 ENCOUNTER — Encounter: Payer: Self-pay | Admitting: Gastroenterology

## 2021-09-11 VITALS — BP 127/84 | HR 87 | Temp 98.6°F | Resp 16 | Ht 74.0 in | Wt 247.0 lb

## 2021-09-11 DIAGNOSIS — K921 Melena: Secondary | ICD-10-CM

## 2021-09-11 DIAGNOSIS — D122 Benign neoplasm of ascending colon: Secondary | ICD-10-CM | POA: Diagnosis not present

## 2021-09-11 DIAGNOSIS — C187 Malignant neoplasm of sigmoid colon: Secondary | ICD-10-CM

## 2021-09-11 DIAGNOSIS — K621 Rectal polyp: Secondary | ICD-10-CM | POA: Diagnosis not present

## 2021-09-11 DIAGNOSIS — D500 Iron deficiency anemia secondary to blood loss (chronic): Secondary | ICD-10-CM | POA: Diagnosis not present

## 2021-09-11 DIAGNOSIS — K6389 Other specified diseases of intestine: Secondary | ICD-10-CM

## 2021-09-11 DIAGNOSIS — K2980 Duodenitis without bleeding: Secondary | ICD-10-CM | POA: Diagnosis not present

## 2021-09-11 DIAGNOSIS — K3189 Other diseases of stomach and duodenum: Secondary | ICD-10-CM | POA: Diagnosis not present

## 2021-09-11 DIAGNOSIS — C189 Malignant neoplasm of colon, unspecified: Secondary | ICD-10-CM | POA: Diagnosis not present

## 2021-09-11 DIAGNOSIS — D128 Benign neoplasm of rectum: Secondary | ICD-10-CM

## 2021-09-11 DIAGNOSIS — D125 Benign neoplasm of sigmoid colon: Secondary | ICD-10-CM

## 2021-09-11 MED ORDER — SODIUM CHLORIDE 0.9 % IV SOLN: 500.0000 mL | Freq: Once | INTRAVENOUS | Status: DC

## 2021-09-11 NOTE — Progress Notes (Signed)
CT contrast and blank instructions provided at discharge.  ?

## 2021-09-11 NOTE — Progress Notes (Signed)
PT taken to PACU. Monitors in place. VSS. Report given to RN. 

## 2021-09-11 NOTE — Op Note (Signed)
[facility] ?Patient Name: Philip Clark ?Procedure Date: 09/11/2021 1:24 PM ?MRN: [id number] ?Endoscopist: Gerrit Heck , MD ?Age: 36 ?Referring MD:  ?Date of Birth: 1986-02-16 ?Gender: Male ?Account #: [id number] ?Procedure:                Upper GI endoscopy ?Indications:              Iron deficiency anemia, Hematochezia ?Medicines:                Monitored Anesthesia Care ?Procedure:                Pre-Anesthesia Assessment: ?                          - Prior to the procedure, a History and Physical  ?                          was performed, and patient medications and  ?                          allergies were reviewed. The patient's tolerance of  ?                          previous anesthesia was also reviewed. The risks  ?                          and benefits of the procedure and the sedation  ?                          options and risks were discussed with the patient.  ?                          All questions were answered, and informed consent  ?                          was obtained. Prior Anticoagulants: The patient has  ?                          taken no previous anticoagulant or antiplatelet  ?                          agents. ASA Grade Assessment: II - A patient with  ?                          mild systemic disease. After reviewing the risks  ?                          and benefits, the patient was deemed in  ?                          satisfactory condition to undergo the procedure. ?                          After obtaining informed consent, the endoscope was  ?  passed under direct vision. Throughout the  ?                          procedure, the patient's blood pressure, pulse, and  ?                          oxygen saturations were monitored continuously. The  ?                          GIF [id number] #[id number] was introduced through the  ?                          mouth, and advanced to the third part of duodenum.  ?                          The upper GI  endoscopy was accomplished without  ?                          difficulty. The patient tolerated the procedure  ?                          well. ?Scope In: ?Scope Out: ?Findings:                 The examined esophagus was normal. ?                          The entire examined stomach was normal. ?                          Localized mildly erythematous mucosa without active  ?                          bleeding was found in the duodenal bulb. Biopsies  ?                          were taken with a cold forceps for histology.  ?                          Estimated blood loss was minimal. ?                          The second portion of the duodenum and third  ?                          portion of the duodenum were normal. Biopsies for  ?                          histology were taken with a cold forceps for  ?                          evaluation of celiac disease. Estimated blood loss  ?                          was minimal. ?Complications:  No immediate complications. ?Estimated Blood Loss:     Estimated blood loss was minimal. ?Impression:               - Normal esophagus. ?                          - Normal stomach. ?                          - Erythematous duodenopathy. Biopsied. ?                          - Normal second portion of the duodenum and third  ?                          portion of the duodenum. Biopsied. ?Recommendation:           - Patient has a contact number available for  ?                          emergencies. The signs and symptoms of potential  ?                          delayed complications were discussed with the  ?                          patient. Return to normal activities tomorrow.  ?                          Written discharge instructions were provided to the  ?                          patient. ?                          - Resume previous diet. ?                          - Perform a colonoscopy today. See colonoscopy  ?                          report for additional details and  recommendations. ?Gerrit Heck, MD ?09/11/2021 2:13:38 PM ?

## 2021-09-11 NOTE — Patient Instructions (Addendum)
Handout provided on polyps.  ? ?My office will contact you to schedule CT scan and MRI. Also they will contact you regarding a referral to a colo-rectal surgeon.  ? ?CT contrast and blank instructions provided today.  ? ?YOU HAD AN ENDOSCOPIC PROCEDURE TODAY AT [facility]:   Refer to the procedure report that was given to you for any specific questions about what was found during the examination.  If the procedure report does not answer your questions, please call your gastroenterologist to clarify.  If you requested that your care partner not be given the details of your procedure findings, then the procedure report has been included in a sealed envelope for you to review at your convenience later. ? ?YOU SHOULD EXPECT: Some feelings of bloating in the abdomen. Passage of more gas than usual.  Walking can help get rid of the air that was put into your GI tract during the procedure and reduce the bloating. If you had a lower endoscopy (such as a colonoscopy or flexible sigmoidoscopy) you may notice spotting of blood in your stool or on the toilet paper. If you underwent a bowel prep for your procedure, you may not have a normal bowel movement for a few days. ? ?Please Note:  You might notice some irritation and congestion in your nose or some drainage.  This is from the oxygen used during your procedure.  There is no need for concern and it should clear up in a day or so. ? ?SYMPTOMS TO REPORT IMMEDIATELY: ? ?Following lower endoscopy (colonoscopy or flexible sigmoidoscopy): ? Excessive amounts of blood in the stool ? Significant tenderness or worsening of abdominal pains ? Swelling of the abdomen that is new, acute ? Fever of 100?F or higher ? ?Following upper endoscopy (EGD) ? Vomiting of blood or coffee ground material ? New chest pain or pain under the shoulder blades ? Painful or persistently difficult swallowing ? New shortness of breath ? Fever of 100?F or higher ? Black, tarry-looking  stools ? ?For urgent or emergent issues, a gastroenterologist can be reached at any hour by calling [phone]. ?Do not use MyChart messaging for urgent concerns.  ? ? ?DIET:  We do recommend a small meal at first, but then you may proceed to your regular diet.  Drink plenty of fluids but you should avoid alcoholic beverages for 24 hours. ? ?ACTIVITY:  You should plan to take it easy for the rest of today and you should NOT DRIVE or use heavy machinery until tomorrow (because of the sedation medicines used during the test).   ? ?FOLLOW UP: ?Our staff will call the number listed on your records 48-72 hours following your procedure to check on you and address any questions or concerns that you may have regarding the information given to you following your procedure. If we do not reach you, we will leave a message.  We will attempt to reach you two times.  During this call, we will ask if you have developed any symptoms of COVID 19. If you develop any symptoms (ie: fever, flu-like symptoms, shortness of breath, cough etc.) before then, please call [phone].  If you test positive for Covid 19 in the 2 weeks post procedure, please call and report this information to [place].   ? ?If any biopsies were taken you will be contacted by phone or by letter within the next 1-3 weeks.  Please call us at [phone] if you have not heard about the  biopsies in 3 weeks.  ? ? ?SIGNATURES/CONFIDENTIALITY: ?You and/or your care partner have signed paperwork which will be entered into your electronic medical record.  These signatures attest to the fact that that the information above on your After Visit Summary has been reviewed and is understood.  Full responsibility of the confidentiality of this discharge information lies with you and/or your care-partner. ? ?

## 2021-09-11 NOTE — Progress Notes (Signed)
Pr Dr.Cirigliano pathology sent rush.[facility] notified ?

## 2021-09-11 NOTE — Progress Notes (Signed)
Called to room to assist during endoscopic procedure.  Patient ID and intended procedure confirmed with present staff. Received instructions for my participation in the procedure from the performing physician.  

## 2021-09-11 NOTE — Progress Notes (Signed)
? ?  GASTROENTEROLOGY PROCEDURE H&P NOTE  ? ?Primary Care Physician: ?Libby Maw, MD ? ? ? ?Reason for Procedure:  Hematochezia, iron deficiency anemia ? ?Plan:    EGD, colonoscopy ? ?Patient is appropriate for endoscopic procedure(s) in the ambulatory ([facility]) setting. ? ?The nature of the procedure, as well as the risks, benefits, and alternatives were carefully and thoroughly reviewed with the patient. Ample time for discussion and questions allowed. The patient understood, was satisfied, and agreed to proceed.  ? ? ? ?HPI: ?Philip Clark is a 36 y.o. male who presents for EGD and colonoscopy for evaluation of iron deficiency anemia and hematochezia. ? ?Past Medical History:  ?Diagnosis Date  ? Allergy   ? Anxiety   ? Bronchitis   ? ? ?Past Surgical History:  ?Procedure Laterality Date  ? ANTERIOR CRUCIATE LIGAMENT (ACL) REVISION Left   ? ? ?Prior to Admission medications   ?Not on File  ? ? ?No current outpatient medications on file.  ? ?Current Facility-Administered Medications  ?Medication Dose Route Frequency Provider Last Rate Last Admin  ? 0.9 %  sodium chloride infusion  500 mL Intravenous Once Tresa Jolley V, DO      ? ? ?Allergies as of 09/11/2021  ? (No Known Allergies)  ? ? ?Family History  ?Problem Relation Age of Onset  ? Anxiety disorder Mother   ? Heart disease Father 40  ?     MI x 2  ? Dementia Maternal Grandmother   ? Heart disease Maternal Grandfather 23  ?     CAD with CABG  ? Bladder Cancer Maternal Grandfather   ? Colon cancer Paternal Grandmother 4  ? Early death Paternal Grandfather 44  ?     CAD/MI  ? Rectal cancer Neg Hx   ? Stomach cancer Neg Hx   ? Esophageal cancer Neg Hx   ? ? ?Social History  ? ?Socioeconomic History  ? Marital status: Married  ?  Spouse name: Not on file  ? Number of children: 2  ? Years of education: Not on file  ? Highest education level: Not on file  ?Occupational History  ? Occupation: IT trainer  ?Tobacco Use  ? Smoking status: Never  ?  Smokeless tobacco: Never  ?Vaping Use  ? Vaping Use: Never used  ?Substance and Sexual Activity  ? Alcohol use: Yes  ?  Alcohol/week: 1.0 standard drink  ?  Types: 1 Cans of beer per week  ?  Comment: occ  ? Drug use: No  ? Sexual activity: Yes  ?Other Topics Concern  ? Not on file  ?Social History Narrative  ? Not on file  ? ?Social Determinants of Health  ? ?Financial Resource Strain: Not on file  ?Food Insecurity: Not on file  ?Transportation Needs: Not on file  ?Physical Activity: Not on file  ?Stress: Not on file  ?Social Connections: Not on file  ?Intimate Partner Violence: Not on file  ? ? ?Physical Exam: ?Vital signs in last 24 hours: ?'@BP'$  117/72   Pulse 88   Temp 98.6 ?F (37 ?C)   Ht '6\' 2"'$  (1.88 m)   Wt 247 lb (112 kg)   SpO2 97%   BMI 31.71 kg/m?  ?GEN: NAD ?EYE: Sclerae anicteric ?ENT: MMM ?CV: Non-tachycardic ?Pulm: CTA b/l ?GI: Soft, NT/ND ?NEURO:  Alert & Oriented x 3 ? ? ?Gerrit Heck, DO ?[facility] ? ? ?09/11/2021 1:22 PM ? ?

## 2021-09-11 NOTE — Op Note (Signed)
[place] ?Patient Name: Philip Clark ?Procedure Date: 09/11/2021 1:23 PM ?MRN: [id number] ?Endoscopist: Gerrit Heck , MD ?Age: 36 ?Referring MD:  ?Date of Birth: 05-03-1986 ?Gender: Male ?Account #: [id number] ?Procedure:                Colonoscopy ?Indications:              This is the patient's first colonoscopy,  ?                          Hematochezia, Iron deficiency anemia ?Medicines:                Monitored Anesthesia Care ?Procedure:                Pre-Anesthesia Assessment: ?                          - Prior to the procedure, a History and Physical  ?                          was performed, and patient medications and  ?                          allergies were reviewed. The patient's tolerance of  ?                          previous anesthesia was also reviewed. The risks  ?                          and benefits of the procedure and the sedation  ?                          options and risks were discussed with the patient.  ?                          All questions were answered, and informed consent  ?                          was obtained. Prior Anticoagulants: The patient has  ?                          taken no previous anticoagulant or antiplatelet  ?                          agents. ASA Grade Assessment: II - A patient with  ?                          mild systemic disease. After reviewing the risks  ?                          and benefits, the patient was deemed in  ?                          satisfactory condition to undergo the procedure. ?  After obtaining informed consent, the colonoscope  ?                          was passed under direct vision. Throughout the  ?                          procedure, the patient's blood pressure, pulse, and  ?                          oxygen saturations were monitored continuously. The  ?                          Olympus CF-HQ190L (#[id number]) Colonoscope was  ?                          introduced through the anus and  advanced to the the  ?                          terminal ileum. The colonoscopy was performed  ?                          without difficulty. The patient tolerated the  ?                          procedure well. The quality of the bowel  ?                          preparation was good. The terminal ileum, ileocecal  ?                          valve, appendiceal orifice, and rectum were  ?                          photographed. ?Scope In: 1:42:57 PM ?Scope Out: 2:09:14 PM ?Scope Withdrawal Time: 0 hours 23 minutes 12 seconds  ?Total Procedure Duration: 0 hours 26 minutes 17 seconds  ?Findings:                 The perianal and digital rectal examinations were  ?                          normal. ?                          A 8 mm polyp was found in the ascending colon. The  ?                          polyp was sessile. The polyp was removed with a  ?                          cold snare. Resection and retrieval were complete.  ?                          Estimated blood loss was minimal. ?  A 4 mm polyp was found in the sigmoid colon. The  ?                          polyp was sessile. The polyp was removed with a  ?                          cold snare. Resection and retrieval were complete.  ?                          Estimated blood loss was minimal. ?                          A frond-like/villous and ulcerated partially  ?                          obstructing large mass was found in the sigmoid  ?                          colon. The mass was circumferential. The mass  ?                          measured six cm in length, located 20-26 cm from  ?                          the anal verge. Oozing was present. This was  ?                          biopsied with a cold forceps for histology. Area  ?                          1-2 cm distal to the lesion was tattooed with an  ?                          injection of 3 mL of Spot (carbon black). ?                          A 2 mm polyp was found in the  rectum. The polyp was  ?                          sessile. The polyp was removed with a cold biopsy  ?                          forceps. Resection and retrieval were complete.  ?                          Estimated blood loss was minimal. ?                          The retroflexed view of the distal rectum and anal  ?                          verge was normal and showed no anal  or rectal  ?                          abnormalities. ?                          The terminal ileum appeared normal. ?Complications:            No immediate complications. ?Estimated Blood Loss:     Estimated blood loss was minimal. ?Impression:               - One 8 mm polyp in the ascending colon, removed  ?                          with a cold snare. Resected and retrieved. ?                          - One 4 mm polyp in the sigmoid colon, removed with  ?                          a cold snare. Resected and retrieved. ?                          - Malignant partially obstructing tumor in the  ?                          sigmoid colon, located 20-26 cm from the anal  ?                          verge. This was traversed. Biopsied. Tattoo placed  ?                          1-2 cm distal to the lesion. ?                          - One 2 mm polyp in the rectum, removed with a cold  ?                          biopsy forceps. Resected and retrieved. ?                          - The distal rectum and anal verge are normal on  ?                          retroflexion view. ?                          - The examined portion of the ileum was normal. ?Recommendation:           - Patient has a contact number available for  ?                          emergencies. The signs and symptoms of potential  ?  delayed complications were discussed with the  ?                          patient. Return to normal activities tomorrow.  ?                          Written discharge instructions were provided to the  ?                          patient. ?                           - Resume previous diet. ?                          - Continue present medications. ?                          - Await pathology results. ?                          - Perform a CT scan (computed tomography) of chest,  ?                          abdomen, and pelvis with contrast at the next  ?                          available appointment. ?                          - Perform magnetic resonance imaging (MRI) Pelvis  ?                          with gadolinium at the next available appointment. ?                          - Check CEA today. ?                          - Refer to a colo-rectal surgeon at the next  ?                          available appointment. ?                          - Return to GI clinic in 3-4 weeks. ?Gerrit Heck, MD ?09/11/2021 2:27:24 PM ?

## 2021-09-11 NOTE — Progress Notes (Signed)
VS-CW  Pt's states no medical or surgical changes since previsit or office visit.  

## 2021-09-12 ENCOUNTER — Telehealth: Payer: Self-pay

## 2021-09-12 ENCOUNTER — Other Ambulatory Visit: Payer: Self-pay

## 2021-09-12 DIAGNOSIS — K6389 Other specified diseases of intestine: Secondary | ICD-10-CM

## 2021-09-12 DIAGNOSIS — C187 Malignant neoplasm of sigmoid colon: Secondary | ICD-10-CM | POA: Diagnosis not present

## 2021-09-12 LAB — CEA: CEA: 2.4 ng/mL

## 2021-09-12 NOTE — Telephone Encounter (Signed)
Pt scheduled for MRI and CT scan as requested on 4/18 colonoscopy report. Pt already had appt with colorectal surgeon today and GI clinic appt has been scheduled. CT and MRI are at [facility] on 09/20/21. Gave pt instructions for CT and MRI. Pt verbalized understanding and had no other concerns at end of call.  ?

## 2021-09-13 ENCOUNTER — Telehealth: Payer: Self-pay | Admitting: *Deleted

## 2021-09-13 ENCOUNTER — Telehealth: Payer: Self-pay | Admitting: Genetic Counselor

## 2021-09-13 NOTE — Telephone Encounter (Signed)
No answer on second attempt follow up call.  ? ?

## 2021-09-13 NOTE — Telephone Encounter (Signed)
?  Follow up Call- ? ? ?  09/11/2021  ? 12:59 PM  ?Call back number  ?Post procedure Call Back phone  # [phone]  ?Permission to leave phone message Yes  ?  ? ?Patient questions: ? ?Phone kept ringing. ?

## 2021-09-13 NOTE — Telephone Encounter (Signed)
Scheduled appt per 4/19 referral. Pt is aware of appt date and time. Pt is aware to arrive 15 mins prior to appt time and to bring and updated insurance card. Pt is aware of appt location.   ?

## 2021-09-13 NOTE — Telephone Encounter (Signed)
Spoke with pt and gave him the number to radiology scheduling so he could see if there are any sooner appointments since CT and MRI have been authorized. Pt verbalized understanding and had no other concerns at end of call.  ?

## 2021-09-18 ENCOUNTER — Ambulatory Visit (HOSPITAL_COMMUNITY)
Admission: RE | Admit: 2021-09-18 | Discharge: 2021-09-18 | Disposition: A | Discharge status: Home / Self Care | Payer: BC Managed Care – PPO | Source: Ambulatory Visit | Attending: Gastroenterology | Admitting: Gastroenterology

## 2021-09-18 DIAGNOSIS — J449 Chronic obstructive pulmonary disease, unspecified: Secondary | ICD-10-CM | POA: Diagnosis not present

## 2021-09-18 DIAGNOSIS — R599 Enlarged lymph nodes, unspecified: Secondary | ICD-10-CM | POA: Diagnosis not present

## 2021-09-18 DIAGNOSIS — C187 Malignant neoplasm of sigmoid colon: Secondary | ICD-10-CM | POA: Diagnosis not present

## 2021-09-18 DIAGNOSIS — K8020 Calculus of gallbladder without cholecystitis without obstruction: Secondary | ICD-10-CM | POA: Diagnosis not present

## 2021-09-18 DIAGNOSIS — K6389 Other specified diseases of intestine: Secondary | ICD-10-CM | POA: Insufficient documentation

## 2021-09-18 MED ORDER — IOHEXOL 300 MG/ML  SOLN
100.0000 mL | Freq: Once | INTRAMUSCULAR | Status: AC | PRN
  Administered 2021-09-18: 100 mL via INTRAVENOUS

## 2021-09-20 ENCOUNTER — Ambulatory Visit (HOSPITAL_COMMUNITY)
Admission: RE | Admit: 2021-09-20 | Discharge: 2021-09-20 | Disposition: A | Discharge status: Home / Self Care | Payer: BC Managed Care – PPO | Source: Ambulatory Visit | Attending: Gastroenterology | Admitting: Gastroenterology

## 2021-09-20 ENCOUNTER — Ambulatory Visit (HOSPITAL_COMMUNITY): Payer: BC Managed Care – PPO

## 2021-09-20 DIAGNOSIS — K6389 Other specified diseases of intestine: Secondary | ICD-10-CM | POA: Insufficient documentation

## 2021-09-20 DIAGNOSIS — Z01818 Encounter for other preprocedural examination: Secondary | ICD-10-CM | POA: Diagnosis not present

## 2021-09-20 DIAGNOSIS — C187 Malignant neoplasm of sigmoid colon: Secondary | ICD-10-CM | POA: Diagnosis not present

## 2021-09-20 DIAGNOSIS — M47816 Spondylosis without myelopathy or radiculopathy, lumbar region: Secondary | ICD-10-CM | POA: Diagnosis not present

## 2021-09-20 DIAGNOSIS — M5127 Other intervertebral disc displacement, lumbosacral region: Secondary | ICD-10-CM | POA: Diagnosis not present

## 2021-09-20 MED ORDER — GADOBUTROL 1 MMOL/ML IV SOLN
10.0000 mL | Freq: Once | INTRAVENOUS | Status: AC | PRN
  Administered 2021-09-20: 10 mL via INTRAVENOUS

## 2021-09-24 ENCOUNTER — Encounter: Payer: Self-pay | Admitting: Gastroenterology

## 2021-09-24 NOTE — Telephone Encounter (Signed)
I spoke with Philip Clark today regarding his MRI findings.  I additionally discussed his MRI findings with his Colorectal Surgeon.  Please see separate note dictated under results section for full details. ?

## 2021-09-25 ENCOUNTER — Other Ambulatory Visit (HOSPITAL_COMMUNITY): Payer: BC Managed Care – PPO

## 2021-09-25 ENCOUNTER — Ambulatory Visit (HOSPITAL_COMMUNITY): Payer: BC Managed Care – PPO

## 2021-10-02 ENCOUNTER — Encounter: Payer: Self-pay | Admitting: Family Medicine

## 2021-10-02 ENCOUNTER — Ambulatory Visit: Payer: BC Managed Care – PPO | Admitting: Family Medicine

## 2021-10-02 ENCOUNTER — Ambulatory Visit: Payer: Self-pay | Admitting: Surgery

## 2021-10-02 VITALS — BP 124/70 | HR 95 | Temp 98.6°F | Ht 74.0 in | Wt 240.2 lb

## 2021-10-02 DIAGNOSIS — R195 Other fecal abnormalities: Secondary | ICD-10-CM

## 2021-10-02 DIAGNOSIS — Z01818 Encounter for other preprocedural examination: Secondary | ICD-10-CM

## 2021-10-02 DIAGNOSIS — C187 Malignant neoplasm of sigmoid colon: Secondary | ICD-10-CM | POA: Diagnosis not present

## 2021-10-02 DIAGNOSIS — K921 Melena: Secondary | ICD-10-CM | POA: Diagnosis not present

## 2021-10-02 NOTE — Progress Notes (Signed)
Sent message, via epic in basket, requesting orders in epic from surgeon.  

## 2021-10-02 NOTE — Progress Notes (Signed)
? ?Established Patient Office Visit ? ?Subjective   ?Patient ID: Philip Clark, male    DOB: 04-04-1986  Age: 36 y.o. MRN: [id number] ? ?Chief Complaint  ?Patient presents with  ? Follow-up  ?  3 month follow up, no concerns. Patient not fasting.   ? ? ?HPI for follow-up of hematochezia with heme positive stool and recent diagnosis of adenocarcinoma of sigmoid colon status post colonoscopy with biopsy.  MRI of pelvis confirms.  CEA antigen was normal.  Patient feels well and healthy.  Continues to work.  Definitive surgical extraction is scheduled for the 24th of this month.  He continues to take iron supplementation.  Denies changes in stooling other than a decrease in the volume of his typical bowel movements.  Of course he is stressed but continues to sleep well for the most part.   ? ? ? ?Review of Systems  ?Constitutional: Negative.   ?HENT: Negative.    ?Eyes:  Negative for blurred vision, discharge and redness.  ?Respiratory: Negative.    ?Cardiovascular: Negative.   ?Gastrointestinal:  Positive for blood in stool. Negative for abdominal pain, constipation, diarrhea and melena.  ?Genitourinary: Negative.   ?Musculoskeletal: Negative.  Negative for myalgias.  ?Skin:  Negative for rash.  ?Neurological:  Negative for tingling, loss of consciousness and weakness.  ?Endo/Heme/Allergies:  Negative for polydipsia.  ? ?  ? ?  10/02/2021  ?  2:06 PM 07/02/2021  ?  2:38 PM 04/17/2021  ?  8:40 AM  ?Depression screen PHQ 2/9  ?Decreased Interest 0 0 0  ?Down, Depressed, Hopeless 1 0 0  ?PHQ - 2 Score 1 0 0  ? ? ? ?Objective:  ?  ? ?BP 124/70 (BP Location: Right Arm, Patient Position: Sitting, Cuff Size: Large)   Pulse 95   Temp 98.6 ?F (37 ?C) (Temporal)   Ht '6\' 2"'$  (1.88 m)   Wt 240 lb 3.2 oz (109 kg)   SpO2 97%   BMI 30.84 kg/m?  ?Wt Readings from Last 3 Encounters:  ?10/02/21 240 lb 3.2 oz (109 kg)  ?09/11/21 247 lb (112 kg)  ?08/01/21 247 lb (112 kg)  ? ?  ? ?Physical Exam ?Vitals and nursing note reviewed.   ?Constitutional:   ?   Appearance: Normal appearance.  ?HENT:  ?   Head: Normocephalic and atraumatic.  ?Eyes:  ?   General: No scleral icterus.    ?   Right eye: No discharge.     ?   Left eye: No discharge.  ?   Extraocular Movements: Extraocular movements intact.  ?   Conjunctiva/sclera: Conjunctivae normal.  ?Pulmonary:  ?   Effort: Pulmonary effort is normal.  ?Neurological:  ?   Mental Status: He is alert.  ?Psychiatric:     ?   Mood and Affect: Mood normal.     ?   Behavior: Behavior normal.  ? ? ? ?No results found for any visits on 10/02/21. ? ? ? ?The ASCVD Risk score (Arnett DK, et al., 2019) failed to calculate for the following reasons: ?  The 2019 ASCVD risk score is only valid for ages 27 to 28 ? ?  ?Assessment & Plan:  ? ?Problem List Items Addressed This Visit   ? ?  ? Digestive  ? Adenocarcinoma of sigmoid colon ([place])  ?  ? Other  ? Heme positive stool  ? Hematochezia - Primary  ? ? ?Return in about 4 months (around 02/02/2022).  ?Return in 4 months for physical exam.  Hopefully all of the above will be behind him at that point in time. ? ?Libby Maw, MD ? ?

## 2021-10-05 ENCOUNTER — Encounter: Payer: BC Managed Care – PPO | Admitting: Gastroenterology

## 2021-10-05 ENCOUNTER — Other Ambulatory Visit (HOSPITAL_COMMUNITY): Payer: Self-pay

## 2021-10-05 NOTE — Patient Instructions (Signed)
DUE TO COVID-19 ONLY TWO VISITORS  (aged 36 and older)  ARE ALLOWED TO COME WITH YOU AND STAY IN THE WAITING ROOM ONLY DURING PRE OP AND PROCEDURE.   ?**NO VISITORS ARE ALLOWED IN THE SHORT STAY AREA OR RECOVERY ROOM!!** ? ?IF YOU WILL BE ADMITTED INTO THE HOSPITAL YOU ARE ALLOWED ONLY FOUR SUPPORT PEOPLE DURING VISITATION HOURS ONLY (7 AM -8PM)   ?The support person(s) must pass our screening, gel in and out, and wear a mask at all times, including in the patient?s room. ?Patients must also wear a mask when staff or their support person are in the room. ?Visitors GUEST BADGE MUST BE WORN VISIBLY  ?One adult visitor may remain with you overnight and MUST be in the room by 8 P.M. ?  ? ? Your procedure is scheduled on: 10/17/21 ? ? Report to [facility] Main Entrance ? ?  Report to admitting at : 6:15 AM ? ? Call this number if you have problems the morning of surgery [phone] ? ? Do not eat food :After Midnight. ? ? Clear liquids starting the day before surgery until : 5:30 AM DAY OF SURGERY. Drink plenty fluids the day before surgery. ? ?Water ?Black Coffee (sugar ok, NO MILK/CREAM OR CREAMERS)  ?Tea (sugar ok, NO MILK/CREAM OR CREAMERS) regular and decaf                             ?Plain Jell-O (NO RED)                                           ?Fruit ices (not with fruit pulp, NO RED)                                     ?Popsicles (NO RED)                                                                  ?Juice: apple, WHITE grape, WHITE cranberry ?Sports drinks like Gatorade (NO RED) ?Clear broth(vegetable,chicken,beef) ?            ?DRINK 2 PRESURGERY ENSURE DRINKS THE NIGHT BEFORE SURGERY AT ? 1000 PM AND 1 PRESURGERY DRINK THE DAY OF THE PROCEDURE 3 HOURS PRIOR TO SCHEDULED SURGERY. NO SOLIDS AFTER MIDNIGHT THE DAY PRIOR TO THE SURGERY. NOTHING BY MOUTH EXCEPT CLEAR LIQUIDS UNTIL THREE HOURS PRIOR TO SCHEDULED SURGERY. PLEASE FINISH PRESURGERY ENSURE DRINK PER SURGEON ORDER 3 HOURS PRIOR TO  SCHEDULED SURGERY TIME WHICH NEEDS TO BE COMPLETED AT: 5:30 AM.  ?  ?The day of surgery:  ?Drink ONE (1) Pre-Surgery Clear Ensure or G2 at AM the morning of surgery. Drink in one sitting. Do not sip.  ?This drink was given to you during your hospital  ?pre-op appointment visit. ?Nothing else to drink after completing the  ?Pre-Surgery Clear Ensure or G2. ?  ?       If you have questions, please contact your surgeon?s office. ? ?FOLLOW BOWEL PREP AND ANY ADDITIONAL PRE OP INSTRUCTIONS YOU  RECEIVED FROM YOUR SURGEON'S OFFICE!!! ?  ?Oral Hygiene is also important to reduce your risk of infection.                                    ?Remember - BRUSH YOUR TEETH THE MORNING OF SURGERY WITH YOUR REGULAR TOOTHPASTE ? ? Do NOT smoke after Midnight ? ? Take these medicines the morning of surgery with A SIP OF WATER: N/A ? ?DO NOT TAKE ANY ORAL DIABETIC MEDICATIONS DAY OF YOUR SURGERY ? ?Bring CPAP mask and tubing day of surgery. ?                  ?           You may not have any metal on your body including hair pins, jewelry, and body piercing ? ?           Do not wear lotions, powders, perfumes/cologne, or deodorant ? ?            Men may shave face and neck. ? ? Do not bring valuables to the hospital. [facility] NOT ?            RESPONSIBLE   FOR VALUABLES. ? ? Contacts, dentures or bridgework may not be worn into surgery. ? ? Bring small overnight bag day of surgery. ?  ? Patients discharged on the day of surgery will not be allowed to drive home.  Someone NEEDS to stay with you for the first 24 hours after anesthesia. ? ? Special Instructions: Bring a copy of your healthcare power of attorney and living will documents         the day of surgery if you haven't scanned them before. ? ?            Please read over the following fact sheets you were given: IF [place] [id number] ? ?   [facility] - Preparing for Surgery ?Before surgery, you can play an important  role.  Because skin is not sterile, your skin needs to be as free of germs as possible.  You can reduce the number of germs on your skin by washing with CHG (chlorahexidine gluconate) soap before surgery.  CHG is an antiseptic cleaner which kills germs and bonds with the skin to continue killing germs even after washing. ?Please DO NOT use if you have an allergy to CHG or antibacterial soaps.  If your skin becomes reddened/irritated stop using the CHG and inform your nurse when you arrive at Short Stay. ?Do not shave (including legs and underarms) for at least 48 hours prior to the first CHG shower.  You may shave your face/neck. ?Please follow these instructions carefully: ? 1.  Shower with CHG Soap the night before surgery and the  morning of Surgery. ? 2.  If you choose to wash your hair, wash your hair first as usual with your  normal  shampoo. ? 3.  After you shampoo, rinse your hair and body thoroughly to remove the  shampoo.                           4.  Use CHG as you would any other liquid soap.  You can apply chg directly  to the skin and wash  ?  Gently with a scrungie or clean washcloth. ? 5.  Apply the CHG Soap to your body ONLY FROM THE NECK DOWN.   Do not use on face/ open      ?                     Wound or open sores. Avoid contact with eyes, ears mouth and genitals (private parts).  ?                     Production manager,  Genitals (private parts) with your normal soap. ?            6.  Wash thoroughly, paying special attention to the area where your surgery  will be performed. ? 7.  Thoroughly rinse your body with warm water from the neck down. ? 8.  DO NOT shower/wash with your normal soap after using and rinsing off  the CHG Soap. ?               9.  Pat yourself dry with a clean towel. ?           10.  Wear clean pajamas. ?           11.  Place clean sheets on your bed the night of your first shower and do not  sleep with pets. ?Day of Surgery : ?Do not apply any lotions/deodorants  the morning of surgery.  Please wear clean clothes to the hospital/surgery center. ? ?FAILURE TO FOLLOW THESE INSTRUCTIONS MAY RESULT IN THE CANCELLATION OF YOUR SURGERY ?PATIENT SIGNATURE_________________________________ ? ?NURSE SIGNATURE__________________________________ ? ?________________________________________________________________________  ? ?Incentive Spirometer ? ?An incentive spirometer is a tool that can help keep your lungs clear and active. This tool measures how well you are filling your lungs with each breath. Taking long deep breaths may help reverse or decrease the chance of developing breathing (pulmonary) problems (especially infection) following: ?A long period of time when you are unable to move or be active. ?BEFORE THE PROCEDURE  ?If the spirometer includes an indicator to show your best effort, your nurse or respiratory therapist will set it to a desired goal. ?If possible, sit up straight or lean slightly forward. Try not to slouch. ?Hold the incentive spirometer in an upright position. ?INSTRUCTIONS FOR USE  ?Sit on the edge of your bed if possible, or sit up as far as you can in bed or on a chair. ?Hold the incentive spirometer in an upright position. ?Breathe out normally. ?Place the mouthpiece in your mouth and seal your lips tightly around it. ?Breathe in slowly and as deeply as possible, raising the piston or the ball toward the top of the column. ?Hold your breath for 3-5 seconds or for as long as possible. Allow the piston or ball to fall to the bottom of the column. ?Remove the mouthpiece from your mouth and breathe out normally. ?Rest for a few seconds and repeat Steps 1 through 7 at least 10 times every 1-2 hours when you are awake. Take your time and take a few normal breaths between deep breaths. ?The spirometer may include an indicator to show your best effort. Use the indicator as a goal to work toward during each repetition. ?After each set of 10 deep breaths, practice  coughing to be sure your lungs are clear. If you have an incision (the cut made at the time of surgery), support your incision when coughing by placing a pillow or rolled up towels  firmly against it. ?Once you are

## 2021-10-08 ENCOUNTER — Encounter (HOSPITAL_COMMUNITY)
Admission: RE | Admit: 2021-10-08 | Discharge: 2021-10-08 | Disposition: A | Discharge status: Home / Self Care | Payer: BC Managed Care – PPO | Source: Ambulatory Visit | Attending: Surgery | Admitting: Surgery

## 2021-10-08 ENCOUNTER — Encounter (HOSPITAL_COMMUNITY): Payer: Self-pay

## 2021-10-08 VITALS — BP 131/78 | HR 84 | Temp 98.2°F | Ht 74.0 in | Wt 240.0 lb

## 2021-10-08 DIAGNOSIS — I2510 Atherosclerotic heart disease of native coronary artery without angina pectoris: Secondary | ICD-10-CM

## 2021-10-08 DIAGNOSIS — Z01818 Encounter for other preprocedural examination: Secondary | ICD-10-CM | POA: Diagnosis not present

## 2021-10-08 HISTORY — DX: Malignant (primary) neoplasm, unspecified: C80.1

## 2021-10-08 HISTORY — DX: Anemia, unspecified: D64.9

## 2021-10-08 HISTORY — DX: Depression, unspecified: F32.A

## 2021-10-08 LAB — CBC WITH DIFFERENTIAL/PLATELET
Abs Immature Granulocytes: 0.02 10*3/uL (ref 0.00–0.07)
Basophils Absolute: 0.1 10*3/uL (ref 0.0–0.1)
Basophils Relative: 1 %
Eosinophils Absolute: 0.6 10*3/uL — ABNORMAL HIGH (ref 0.0–0.5)
Eosinophils Relative: 7 %
HCT: 35.8 % — ABNORMAL LOW (ref 39.0–52.0)
Hemoglobin: 11.3 g/dL — ABNORMAL LOW (ref 13.0–17.0)
Immature Granulocytes: 0 %
Lymphocytes Relative: 26 %
Lymphs Abs: 2.1 10*3/uL (ref 0.7–4.0)
MCH: 27 pg (ref 26.0–34.0)
MCHC: 31.6 g/dL (ref 30.0–36.0)
MCV: 85.6 fL (ref 80.0–100.0)
Monocytes Absolute: 0.8 10*3/uL (ref 0.1–1.0)
Monocytes Relative: 10 %
Neutro Abs: 4.7 10*3/uL (ref 1.7–7.7)
Neutrophils Relative %: 56 %
Platelets: 326 10*3/uL (ref 150–400)
RBC: 4.18 MIL/uL — ABNORMAL LOW (ref 4.22–5.81)
RDW: 14.2 % (ref 11.5–15.5)
WBC: 8.2 10*3/uL (ref 4.0–10.5)
nRBC: 0 % (ref 0.0–0.2)

## 2021-10-08 LAB — COMPREHENSIVE METABOLIC PANEL
ALT: 17 U/L (ref 0–44)
AST: 19 U/L (ref 15–41)
Albumin: 4.2 g/dL (ref 3.5–5.0)
Alkaline Phosphatase: 56 U/L (ref 38–126)
Anion gap: 6 (ref 5–15)
BUN: 18 mg/dL (ref 6–20)
CO2: 25 mmol/L (ref 22–32)
Calcium: 9.2 mg/dL (ref 8.9–10.3)
Chloride: 108 mmol/L (ref 98–111)
Creatinine, Ser: 1.06 mg/dL (ref 0.61–1.24)
GFR, Estimated: >60 mL/min (ref >60)
Glucose, Bld: 96 mg/dL (ref 70–99)
Potassium: 4.6 mmol/L (ref 3.5–5.1)
Sodium: 139 mmol/L (ref 135–145)
Total Bilirubin: 0.8 mg/dL (ref 0.3–1.2)
Total Protein: 7.7 g/dL (ref 6.5–8.1)

## 2021-10-08 NOTE — Progress Notes (Signed)
For Short Stay: ?[facility] appointment date: ?Date of COVID positive in last 90 days: ? ?Bowel Prep reminder: ? ? ?For Anesthesia: ?PCP - Dr. Krystal Clark ?Cardiologist - Dr. Berniece Salines. LOV: 03/28/20 ? ?Chest x-ray - 09/18/21 ?EKG -  ?Stress Test -  ?ECHO - 04/17/20 ?Cardiac Cath -  ?Pacemaker/ICD device last checked: ?Pacemaker orders received: ?Device Rep notified: ? ?Spinal Cord Stimulator: ? ?Sleep Study -  ?CPAP -  ? ?Fasting Blood Sugar -  ?Checks Blood Sugar _____ times a day ?Date and result of last Hgb A1c- ? ?Blood Thinner Instructions: ?Aspirin Instructions: ?Last Dose: ? ?Activity level: Can go up a flight of stairs and activities of daily living without stopping and without chest pain and/or shortness of breath ?  Able to exercise without chest pain and/or shortness of breath ?  Unable to go up a flight of stairs without chest pain and/or shortness of breath ?   ? ?Anesthesia review: Hx: HTN ? ?Patient denies shortness of breath, fever, cough and chest pain at PAT appointment ? ? ?Patient verbalized understanding of instructions that were given to them at the PAT appointment. Patient was also instructed that they will need to review over the PAT instructions again at home before surgery.  ?

## 2021-10-09 ENCOUNTER — Other Ambulatory Visit: Payer: Self-pay

## 2021-10-09 ENCOUNTER — Other Ambulatory Visit (HOSPITAL_COMMUNITY): Payer: Self-pay

## 2021-10-09 ENCOUNTER — Encounter: Payer: Self-pay | Admitting: Genetic Counselor

## 2021-10-09 ENCOUNTER — Inpatient Hospital Stay: Payer: BC Managed Care – PPO | Attending: Genetic Counselor | Admitting: Genetic Counselor

## 2021-10-09 ENCOUNTER — Other Ambulatory Visit: Payer: Self-pay | Admitting: Genetic Counselor

## 2021-10-09 ENCOUNTER — Inpatient Hospital Stay: Payer: BC Managed Care – PPO

## 2021-10-09 DIAGNOSIS — Z808 Family history of malignant neoplasm of other organs or systems: Secondary | ICD-10-CM | POA: Diagnosis not present

## 2021-10-09 DIAGNOSIS — Z1379 Encounter for other screening for genetic and chromosomal anomalies: Secondary | ICD-10-CM

## 2021-10-09 DIAGNOSIS — Z8052 Family history of malignant neoplasm of bladder: Secondary | ICD-10-CM

## 2021-10-09 DIAGNOSIS — C187 Malignant neoplasm of sigmoid colon: Secondary | ICD-10-CM

## 2021-10-09 DIAGNOSIS — Z800 Family history of malignant neoplasm of digestive organs: Secondary | ICD-10-CM

## 2021-10-09 LAB — GENETIC SCREENING ORDER

## 2021-10-09 NOTE — Progress Notes (Signed)
REFERRING PROVIDER: ?Ileana Roup, MD ?[address] ?[address] ?[place],  [place] [address] ? ?PRIMARY PROVIDER:  ?Libby Maw, MD ? ?PRIMARY REASON FOR VISIT:  ?1. Adenocarcinoma of sigmoid colon ([place])   ? ? ?HISTORY OF PRESENT ILLNESS:   ?Mr. Canal, a 36 y.o. male, was seen for a [place] cancer genetics consultation at the request of Dr. Dema Severin due to a personal and family history of cancer.  Mr. Cocuzza presents to clinic today to discuss the possibility of a hereditary predisposition to cancer, to discuss genetic testing, and to further clarify his future cancer risks, as well as potential cancer risks for family members.  ? ?In April 2023, at the age of 44, Mr. Launer was diagnosed with colon cancer.  ? ?CANCER HISTORY:  ?Oncology History  ? No history exists.  ? ? ?Past Medical History:  ?Diagnosis Date  ? Allergy   ? Anemia   ? Anxiety   ? Bronchitis   ? Cancer [facility])   ? Depression   ? ? ?Past Surgical History:  ?Procedure Laterality Date  ? ANTERIOR CRUCIATE LIGAMENT (ACL) REVISION Left   ? ? ?Social History  ? ?Socioeconomic History  ? Marital status: Married  ?  Spouse name: Not on file  ? Number of children: 2  ? Years of education: Not on file  ? Highest education level: Not on file  ?Occupational History  ? Occupation: IT trainer  ?Tobacco Use  ? Smoking status: Never  ? Smokeless tobacco: Never  ?Vaping Use  ? Vaping Use: Never used  ?Substance and Sexual Activity  ? Alcohol use: Yes  ?  Alcohol/week: 1.0 standard drink  ?  Types: 1 Cans of beer per week  ?  Comment: occ  ? Drug use: No  ? Sexual activity: Yes  ?Other Topics Concern  ? Not on file  ?Social History Narrative  ? Not on file  ? ?Social Determinants of Health  ? ?Financial Resource Strain: Not on file  ?Food Insecurity: Not on file  ?Transportation Needs: Not on file  ?Physical Activity: Not on file  ?Stress: Not on file  ?Social Connections: Not on file  ?  ? ?FAMILY HISTORY:  ?We obtained a detailed,  4-generation family history.  Significant diagnoses are listed below: ?Family History  ?Problem Relation Age of Onset  ? Skin cancer Father   ?     basal or squamous cell  ? Bladder Cancer Maternal Grandfather   ?     dx. 65s  ? Colon cancer Paternal Grandmother   ?     dx. 60s/70s  ? ? ? ?Mr. Ezelle's maternal grandfather was diagnosed with bladder cancer in his 80s, he died due to metastatic bladder cancer at age 73. His father has a history of skin cancer (basal or squamous cell). Mr. Payment's paternal grandmother was diagnosed with colon cancer in her 2s/70s and died due to a blood clot. Mr. Macrae is unaware of previous family history of genetic testing for hereditary cancer risks. There is no reported Ashkenazi Jewish ancestry.  ? ?GENETIC COUNSELING ASSESSMENT: Mr. Penado is a 36 y.o. male with a personal and family history of cancer which is somewhat suggestive of a hereditary predisposition to cancer given his young age at diagnosis. We, therefore, discussed and recommended the following at today's visit.  ? ?DISCUSSION: We discussed that 5 - 10% of cancer is hereditary, with most cases of colon cancer associated with Lynch Syndrome.  There are other  genes that can be associated with hereditary colon cancer syndromes.  We discussed that testing is beneficial for several reasons including knowing how to follow individuals after completing their treatment, identifying whether potential treatment options would be beneficial, and understanding if other family members could be at risk for cancer and allowing them to undergo genetic testing.  ? ?We reviewed the characteristics, features and inheritance patterns of hereditary cancer syndromes. We also discussed genetic testing, including the appropriate family members to test, the process of testing, insurance coverage and turn-around-time for results. We discussed the implications of a negative, positive, carrier and/or variant of uncertain significant  result. We recommended Mr. Hust pursue genetic testing for a panel that includes genes associated with colon and bladder cancer.  ? ?Mr. Ragsdale  was offered a common hereditary cancer panel (47 genes) and an expanded pan-cancer panel (77 genes). Mr. Nelis was informed of the benefits and limitations of each panel, including that expanded pan-cancer panels contain genes that do not have clear management guidelines at this point in time.  We also discussed that as the number of genes included on a panel increases, the chances of variants of uncertain significance increases.  After considering the benefits and limitations of each gene panel, Mr. Wiedemann elected to have [facility] CancerNext-Expanded Panel. ? ?The CancerNext-Expanded gene panel offered by [facility] and includes sequencing, rearrangement, and RNA analysis for the following 77 genes: AIP, ALK, APC, ATM, AXIN2, BAP1, BARD1, BLM, BMPR1A, BRCA1, BRCA2, BRIP1, CDC73, CDH1, CDK4, CDKN1B, CDKN2A, CHEK2, CTNNA1, DICER1, FANCC, FH, FLCN, GALNT12, KIF1B, LZTR1, MAX, MEN1, MET, MLH1, MSH2, MSH3, MSH6, MUTYH, NBN, NF1, NF2, NTHL1, PALB2, PHOX2B, PMS2, POT1, PRKAR1A, PTCH1, PTEN, RAD51C, RAD51D, RB1, RECQL, RET, SDHA, SDHAF2, SDHB, SDHC, SDHD, SMAD4, SMARCA4, SMARCB1, SMARCE1, STK11, SUFU, TMEM127, TP53, TSC1, TSC2, VHL and XRCC2 (sequencing and deletion/duplication); EGFR, EGLN1, HOXB13, KIT, MITF, PDGFRA, POLD1, and POLE (sequencing only); EPCAM and GREM1 (deletion/duplication only).  ? ?Based on Mr. Junio's personal and family history of cancer, he meets medical criteria for genetic testing. Despite that he meets criteria, he may still have an out of pocket cost. We discussed that if his out of pocket cost for testing is over $100, the laboratory will call and confirm whether he wants to proceed with testing.  If the out of pocket cost of testing is less than $100 he will be billed by the genetic testing laboratory.  ? ?PLAN: After considering the risks,  benefits, and limitations, Mr. Matousek provided informed consent to pursue genetic testing and the blood sample was sent to [facility] for analysis of the CancerNext-Expanded Panel. Results should be available within approximately 2-3 weeks' time, at which point they will be disclosed by telephone to Mr. Leser, as will any additional recommendations warranted by these results. Mr. Minahan will receive a summary of his genetic counseling visit and a copy of his results once available. This information will also be available in Epic.  ? ?Mr. Flynn's questions were answered to his satisfaction today. Our contact information was provided should additional questions or concerns arise. Thank you for the referral and allowing [place] to share in the care of your patient.  ? ?Lucille Passy, MS, [place] ?Genetic Counselor ?[email or web address] ?(P) [phone] ? ?The patient was seen for a total of 35 minutes in face-to-face genetic counseling. The patient was seen alone.  Drs. Lindi Adie and/or Burr Medico were available to discuss this case as needed.  ? ?_______________________________________________________________________ ?For Office Staff:  ?Number of people involved in session: 1 ?  Was an Intern/ student involved with case: no ? ?

## 2021-10-11 ENCOUNTER — Other Ambulatory Visit: Payer: BC Managed Care – PPO

## 2021-10-11 ENCOUNTER — Encounter: Payer: BC Managed Care – PPO | Admitting: Genetic Counselor

## 2021-10-15 ENCOUNTER — Ambulatory Visit (INDEPENDENT_AMBULATORY_CARE_PROVIDER_SITE_OTHER): Payer: BC Managed Care – PPO | Admitting: Gastroenterology

## 2021-10-15 ENCOUNTER — Encounter: Payer: Self-pay | Admitting: Gastroenterology

## 2021-10-15 VITALS — BP 122/70 | HR 86 | Ht 74.0 in | Wt 241.0 lb

## 2021-10-15 DIAGNOSIS — C187 Malignant neoplasm of sigmoid colon: Secondary | ICD-10-CM | POA: Diagnosis not present

## 2021-10-15 DIAGNOSIS — D500 Iron deficiency anemia secondary to blood loss (chronic): Secondary | ICD-10-CM | POA: Diagnosis not present

## 2021-10-15 NOTE — Patient Instructions (Addendum)
If you are age 36 or younger, your body mass index should be between 19-25. Your Body mass index is 30.94 kg/m. If this is out of the aformentioned range listed, please consider follow up with your Primary Care Provider.   ________________________________________________________  The [facility] providers would like to encourage you to use [facility] to communicate with providers for non-urgent requests or questions.  Due to long hold times on the telephone, sending your provider a message by [facility] may be a faster and more efficient way to get a response.  Please allow 48 business hours for a response.  Please remember that this is for non-urgent requests.  _______________________________________________________  Due to recent changes in healthcare laws, you may see the results of your imaging and laboratory studies on MyChart before your provider has had a chance to review them.  We understand that in some cases there may be results that are confusing or concerning to you. Not all laboratory results come back in the same time frame and the provider may be waiting for multiple results in order to interpret others.  Please give [place] 48 hours in order for your provider to thoroughly review all the results before contacting the office for clarification of your results.    Please follow up in 1 year. Give [place] a call at [phone] to schedule an appointment.   Thank you for choosing me and [facility].  Vito Cirigliano, D.O.

## 2021-10-15 NOTE — Progress Notes (Signed)
Chief Complaint:    Colon cancer  GI History: 36 year old male initially seen in GI clinic on 08/01/2021 for evaluation of intermittent BRBPR x6 months - 08/01/2021: Initial GI appointment.  H/H 11.8/35.7 with MCV/RDW 18.7/14.  Ferritin 6.4, iron 62, TIBC 411, sat 15%.  Normal B12/folate. - 09/11/2021: EGD: Mild peptic duodenitis, otherwise normal.  Celiac Disease - 09/11/2021: Colonoscopy: 8 mm ascending colon adenoma, 4 mm sigmoid adenoma, 2 mm benign rectal polyp.  6 cm partially obstructing mass in sigmoid colon located 20-26 cm from anal verge (path: Adenocarcinoma).  Tattoo placed 1-2 cm distal to the lesion.  CEA normal - 09/12/2021: Evaluated by Dr. Dema Severin in the Colorectal Surgery clinic.  Plan for robotic assisted sigmoidectomy/low anterior resection - 09/18/2021: CT C/A/P: No evidence of distal metastasis.  Sigmoid mass w/o high-grade obstruction.  Prominent lymph nodes in the sigmoid mesentery measuring up to 6 mm - 09/20/2021: MRI pelvis: Partially obstructing sigmoid mass with prominent sigmoid mesenteric lymph nodes suspicious for early nodal metastasis measuring up to 7 mm - 10/09/2021: Evaluation in [facility]. Panel pending.   HPI:     Patient is a 36 y.o. male presenting to the Gastroenterology Clinic for follow-up after being diagnosed with sigmoid adenocarcinoma on colonoscopy last month.  Since then, has been evaluated with CT C/A/P, MRI pelvis, and seen in the Colorectal Surgery clinic and [facility], as outlined above, along with having had follow-up with [facility] on 10/02/2021. He is scheduled for robotic assisted sigmoidectomy/low anterior resection on 10/17/2021.  He is here to discuss the colonoscopy findings, upcoming surgery.  He is otherwise without any complaints/symptoms.     Latest Ref Rng & Units 10/08/2021   10:34 AM 08/01/2021    2:50 PM 03/17/2020   12:32 PM  CBC  WBC 4.0 - 10.5 K/uL 8.2   9.5     Hemoglobin 13.0 - 17.0 g/dL 11.3   11.8   13.9    Hematocrit  39.0 - 52.0 % 35.8   35.7   41.0    Platelets 150 - 400 K/uL 326   311.0        Review of systems:     No chest pain, no SOB, no fevers, no urinary sx   Past Medical History:  Diagnosis Date   Allergy    Anemia    Anxiety    Bronchitis    Cancer ([place])    Depression     Patient's surgical history, family medical history, social history, medications and allergies were all reviewed in Epic    Current Outpatient Medications  Medication Sig Dispense Refill   Ascorbic Acid (VITAMIN C) 100 MG tablet Take 100 mg by mouth daily.     cholecalciferol (VITAMIN D3) 25 MCG (1000 UNIT) tablet Take 1,000 Units by mouth daily.     Multiple Vitamin (MULTIVITAMIN WITH MINERALS) TABS tablet Take 1 tablet by mouth daily.     Omega-3 Fatty Acids (FISH OIL PO) Take 1 capsule by mouth daily.     zinc gluconate 50 MG tablet Take 50 mg by mouth daily.     No current facility-administered medications for this visit.    Physical Exam:     BP 122/70   Pulse 86   Ht '6\' 2"'$  (1.88 m)   Wt 241 lb (109.3 kg)   BMI 30.94 kg/m   GENERAL:  Pleasant male in NAD PSYCH: : Cooperative, normal affect Musculoskeletal:  Normal muscle tone, normal strength NEURO: Alert and oriented x 3, no focal  neurologic deficits   IMPRESSION and PLAN:    1) Colon Cancer 36 year old male with newly diagnosed sigmoid Adenocarcinoma. - Scheduled for robotic assisted sigmoid resection/low anterior resection this week with Dr. Dema Severin - Decisions regarding any need for chemotherapy, radiation, etc. to me by Colorectal Surgery and pending upcoming surgical resection - Tentative plan for repeat colonoscopy in 1 year for short interval surveillance - Has also already been seen in the [facility], with genetic panel pending  2) Iron deficiency anemia - Secondary to the above colon cancer - Plan to recheck CBC and iron panel after surgery  RTC in 1 year or sooner prn           Lavena Bullion ,DO, FACG 10/15/2021,  9:18 AM

## 2021-10-16 NOTE — Anesthesia Preprocedure Evaluation (Signed)
Anesthesia Evaluation  Patient identified by MRN, date of birth, ID band Patient awake    Reviewed: Allergy & Precautions, NPO status , Patient's Chart, lab work & pertinent test results  Airway Mallampati: I       Dental no notable dental hx.    Pulmonary neg pulmonary ROS,    Pulmonary exam normal        Cardiovascular negative cardio ROS Normal cardiovascular exam     Neuro/Psych PSYCHIATRIC DISORDERS Anxiety Depression  Neuromuscular disease    GI/Hepatic Neg liver ROS,   Endo/Other  negative endocrine ROS  Renal/GU negative Renal ROS  negative genitourinary   Musculoskeletal negative musculoskeletal ROS (+)   Abdominal Normal abdominal exam  (+)   Peds  Hematology  (+) Blood dyscrasia, anemia ,   Anesthesia Other Findings   Reproductive/Obstetrics                            Anesthesia Physical Anesthesia Plan  ASA: 2  Anesthesia Plan: General   Post-op Pain Management:    Induction: Intravenous  PONV Risk Score and Plan: 4 or greater and Midazolam, Ondansetron and Dexamethasone  Airway Management Planned: Oral ETT  Additional Equipment: None  Intra-op Plan:   Post-operative Plan: Extubation in OR  Informed Consent: I have reviewed the patients History and Physical, chart, labs and discussed the procedure including the risks, benefits and alternatives for the proposed anesthesia with the patient or authorized representative who has indicated his/her understanding and acceptance.     Dental advisory given  Plan Discussed with: CRNA  Anesthesia Plan Comments:        Anesthesia Quick Evaluation

## 2021-10-17 ENCOUNTER — Other Ambulatory Visit: Payer: Self-pay

## 2021-10-17 ENCOUNTER — Inpatient Hospital Stay (HOSPITAL_COMMUNITY): Payer: BC Managed Care – PPO | Admitting: Anesthesiology

## 2021-10-17 ENCOUNTER — Encounter (HOSPITAL_COMMUNITY): Payer: Self-pay | Admitting: Surgery

## 2021-10-17 ENCOUNTER — Encounter (HOSPITAL_COMMUNITY)
Admission: RE | Disposition: A | Discharge status: Home / Self Care | Payer: Self-pay | Source: Home / Self Care | Attending: Surgery

## 2021-10-17 ENCOUNTER — Inpatient Hospital Stay (HOSPITAL_COMMUNITY)
Admission: RE | Admit: 2021-10-17 | Discharge: 2021-10-19 | DRG: 330 | Disposition: A | Discharge status: Home / Self Care | Payer: BC Managed Care – PPO | Attending: Surgery | Admitting: Surgery

## 2021-10-17 DIAGNOSIS — F418 Other specified anxiety disorders: Secondary | ICD-10-CM | POA: Diagnosis not present

## 2021-10-17 DIAGNOSIS — Z8052 Family history of malignant neoplasm of bladder: Secondary | ICD-10-CM

## 2021-10-17 DIAGNOSIS — Z808 Family history of malignant neoplasm of other organs or systems: Secondary | ICD-10-CM | POA: Diagnosis not present

## 2021-10-17 DIAGNOSIS — Z800 Family history of malignant neoplasm of digestive organs: Secondary | ICD-10-CM | POA: Diagnosis not present

## 2021-10-17 DIAGNOSIS — C187 Malignant neoplasm of sigmoid colon: Principal | ICD-10-CM | POA: Diagnosis present

## 2021-10-17 DIAGNOSIS — C772 Secondary and unspecified malignant neoplasm of intra-abdominal lymph nodes: Secondary | ICD-10-CM | POA: Diagnosis not present

## 2021-10-17 DIAGNOSIS — C19 Malignant neoplasm of rectosigmoid junction: Secondary | ICD-10-CM | POA: Diagnosis not present

## 2021-10-17 DIAGNOSIS — Z86010 Personal history of colonic polyps: Secondary | ICD-10-CM | POA: Diagnosis not present

## 2021-10-17 DIAGNOSIS — Z818 Family history of other mental and behavioral disorders: Secondary | ICD-10-CM | POA: Diagnosis not present

## 2021-10-17 DIAGNOSIS — D630 Anemia in neoplastic disease: Secondary | ICD-10-CM | POA: Diagnosis not present

## 2021-10-17 DIAGNOSIS — Z8249 Family history of ischemic heart disease and other diseases of the circulatory system: Secondary | ICD-10-CM

## 2021-10-17 DIAGNOSIS — Z9049 Acquired absence of other specified parts of digestive tract: Principal | ICD-10-CM

## 2021-10-17 HISTORY — PX: FLEXIBLE SIGMOIDOSCOPY: SHX5431

## 2021-10-17 HISTORY — PX: XI ROBOTIC ASSISTED LOWER ANTERIOR RESECTION: SHX6558

## 2021-10-17 LAB — TYPE AND SCREEN
ABO/RH(D): O POS
Antibody Screen: NEGATIVE

## 2021-10-17 LAB — ABO/RH: ABO/RH(D): O POS

## 2021-10-17 SURGERY — RESECTION, RECTUM, LOW ANTERIOR, ROBOT-ASSISTED
Anesthesia: General | Site: Rectum

## 2021-10-17 MED ORDER — POLYETHYLENE GLYCOL 3350 17 GM/SCOOP PO POWD: 1.0000 | Freq: Once | ORAL | Status: DC

## 2021-10-17 MED ORDER — HYDROMORPHONE HCL 1 MG/ML IJ SOLN
0.2500 mg | INTRAMUSCULAR | Status: DC | PRN
  Administered 2021-10-17 (×4): 0.5 mg via INTRAVENOUS

## 2021-10-17 MED ORDER — SIMETHICONE 80 MG PO CHEW: 40.0000 mg | CHEWABLE_TABLET | Freq: Four times a day (QID) | ORAL | Status: DC | PRN

## 2021-10-17 MED ORDER — LACTATED RINGERS IV SOLN: INTRAVENOUS | Status: DC

## 2021-10-17 MED ORDER — CHLORHEXIDINE GLUCONATE CLOTH 2 % EX PADS
6.0000 | MEDICATED_PAD | Freq: Once | CUTANEOUS | Status: DC
Start: 2021-10-17 — End: 2021-10-17

## 2021-10-17 MED ORDER — FENTANYL CITRATE (PF) 250 MCG/5ML IJ SOLN
INTRAMUSCULAR | Status: AC
  Filled 2021-10-17: qty 5

## 2021-10-17 MED ORDER — ALVIMOPAN 12 MG PO CAPS
12.0000 mg | ORAL_CAPSULE | ORAL | Status: AC
  Administered 2021-10-17: 12 mg via ORAL
  Filled 2021-10-17: qty 1

## 2021-10-17 MED ORDER — LACTATED RINGERS IR SOLN
Status: DC | PRN
  Administered 2021-10-17: 1000 mL

## 2021-10-17 MED ORDER — ENSURE PRE-SURGERY PO LIQD: 592.0000 mL | Freq: Once | ORAL | Status: DC

## 2021-10-17 MED ORDER — MIDAZOLAM HCL 2 MG/2ML IJ SOLN
INTRAMUSCULAR | Status: AC
  Filled 2021-10-17: qty 2

## 2021-10-17 MED ORDER — 0.9 % SODIUM CHLORIDE (POUR BTL) OPTIME
TOPICAL | Status: DC | PRN
  Administered 2021-10-17: 2000 mL

## 2021-10-17 MED ORDER — DIPHENHYDRAMINE HCL 12.5 MG/5ML PO ELIX: 12.5000 mg | ORAL_SOLUTION | Freq: Four times a day (QID) | ORAL | Status: DC | PRN

## 2021-10-17 MED ORDER — BUPIVACAINE-EPINEPHRINE (PF) 0.25% -1:200000 IJ SOLN
INTRAMUSCULAR | Status: AC
  Filled 2021-10-17: qty 30

## 2021-10-17 MED ORDER — ONDANSETRON HCL 4 MG PO TABS: 4.0000 mg | ORAL_TABLET | Freq: Four times a day (QID) | ORAL | Status: DC | PRN

## 2021-10-17 MED ORDER — METRONIDAZOLE 500 MG PO TABS: 1000.0000 mg | ORAL_TABLET | ORAL | Status: DC

## 2021-10-17 MED ORDER — BISACODYL 5 MG PO TBEC: 20.0000 mg | DELAYED_RELEASE_TABLET | Freq: Once | ORAL | Status: DC

## 2021-10-17 MED ORDER — ONDANSETRON HCL 4 MG/2ML IJ SOLN
INTRAMUSCULAR | Status: AC
  Filled 2021-10-17: qty 2

## 2021-10-17 MED ORDER — HYDRALAZINE HCL 20 MG/ML IJ SOLN: 10.0000 mg | INTRAMUSCULAR | Status: DC | PRN

## 2021-10-17 MED ORDER — BUPIVACAINE LIPOSOME 1.3 % IJ SUSP: 20.0000 mL | Freq: Once | INTRAMUSCULAR | Status: DC

## 2021-10-17 MED ORDER — ENSURE SURGERY PO LIQD
237.0000 mL | Freq: Two times a day (BID) | ORAL | Status: DC
  Administered 2021-10-17 – 2021-10-18 (×3): 237 mL via ORAL

## 2021-10-17 MED ORDER — ACETAMINOPHEN 500 MG PO TABS
1000.0000 mg | ORAL_TABLET | ORAL | Status: AC
  Administered 2021-10-17: 1000 mg via ORAL
  Filled 2021-10-17: qty 2

## 2021-10-17 MED ORDER — HEPARIN SODIUM (PORCINE) 5000 UNIT/ML IJ SOLN
5000.0000 [IU] | Freq: Once | INTRAMUSCULAR | Status: AC
  Administered 2021-10-17: 5000 [IU] via SUBCUTANEOUS
  Filled 2021-10-17: qty 1

## 2021-10-17 MED ORDER — INDOCYANINE GREEN 25 MG IV SOLR
INTRAVENOUS | Status: DC | PRN
  Administered 2021-10-17: 2.5 mg via INTRAVENOUS

## 2021-10-17 MED ORDER — SUGAMMADEX SODIUM 500 MG/5ML IV SOLN
INTRAVENOUS | Status: AC
  Filled 2021-10-17: qty 5

## 2021-10-17 MED ORDER — CHLORHEXIDINE GLUCONATE 0.12 % MT SOLN
15.0000 mL | Freq: Once | OROMUCOSAL | Status: AC
  Administered 2021-10-17: 15 mL via OROMUCOSAL

## 2021-10-17 MED ORDER — ONDANSETRON HCL 4 MG/2ML IJ SOLN: 4.0000 mg | Freq: Once | INTRAMUSCULAR | Status: DC | PRN

## 2021-10-17 MED ORDER — KETOROLAC TROMETHAMINE 30 MG/ML IJ SOLN
INTRAMUSCULAR | Status: AC
  Filled 2021-10-17: qty 1

## 2021-10-17 MED ORDER — ALVIMOPAN 12 MG PO CAPS: 12.0000 mg | ORAL_CAPSULE | Freq: Two times a day (BID) | ORAL | Status: DC

## 2021-10-17 MED ORDER — MIDAZOLAM HCL 2 MG/2ML IJ SOLN
INTRAMUSCULAR | Status: DC | PRN
  Administered 2021-10-17: 2 mg via INTRAVENOUS

## 2021-10-17 MED ORDER — ENSURE PRE-SURGERY PO LIQD: 296.0000 mL | Freq: Once | ORAL | Status: DC

## 2021-10-17 MED ORDER — PROPOFOL 10 MG/ML IV BOLUS
INTRAVENOUS | Status: DC | PRN
  Administered 2021-10-17: 200 mg via INTRAVENOUS

## 2021-10-17 MED ORDER — FENTANYL CITRATE (PF) 250 MCG/5ML IJ SOLN
INTRAMUSCULAR | Status: DC | PRN
  Administered 2021-10-17: 100 ug via INTRAVENOUS
  Administered 2021-10-17 (×2): 50 ug via INTRAVENOUS
  Administered 2021-10-17: 100 ug via INTRAVENOUS
  Administered 2021-10-17: 50 ug via INTRAVENOUS

## 2021-10-17 MED ORDER — DIPHENHYDRAMINE HCL 50 MG/ML IJ SOLN
12.5000 mg | Freq: Four times a day (QID) | INTRAMUSCULAR | Status: DC | PRN
  Administered 2021-10-17: 12.5 mg via INTRAVENOUS
  Filled 2021-10-17: qty 1

## 2021-10-17 MED ORDER — TRAMADOL HCL 50 MG PO TABS
50.0000 mg | ORAL_TABLET | Freq: Four times a day (QID) | ORAL | Status: DC | PRN
  Administered 2021-10-17 – 2021-10-19 (×4): 50 mg via ORAL
  Filled 2021-10-17 (×4): qty 1

## 2021-10-17 MED ORDER — ALUM & MAG HYDROXIDE-SIMETH 200-200-20 MG/5ML PO SUSP: 30.0000 mL | Freq: Four times a day (QID) | ORAL | Status: DC | PRN

## 2021-10-17 MED ORDER — BUPIVACAINE-EPINEPHRINE (PF) 0.25% -1:200000 IJ SOLN
INTRAMUSCULAR | Status: DC | PRN
  Administered 2021-10-17: 30 mL

## 2021-10-17 MED ORDER — LACTATED RINGERS IV SOLN: INTRAVENOUS | Status: DC | PRN

## 2021-10-17 MED ORDER — NEOMYCIN SULFATE 500 MG PO TABS: 1000.0000 mg | ORAL_TABLET | ORAL | Status: DC

## 2021-10-17 MED ORDER — KETAMINE HCL 10 MG/ML IJ SOLN
INTRAMUSCULAR | Status: DC | PRN
  Administered 2021-10-17: 45 mg via INTRAVENOUS

## 2021-10-17 MED ORDER — LIDOCAINE 2% (20 MG/ML) 5 ML SYRINGE
INTRAMUSCULAR | Status: DC | PRN
  Administered 2021-10-17: 100 mg via INTRAVENOUS

## 2021-10-17 MED ORDER — PROPOFOL 10 MG/ML IV BOLUS
INTRAVENOUS | Status: AC
  Filled 2021-10-17: qty 20

## 2021-10-17 MED ORDER — CHLORHEXIDINE GLUCONATE CLOTH 2 % EX PADS: 6.0000 | MEDICATED_PAD | Freq: Once | CUTANEOUS | Status: DC

## 2021-10-17 MED ORDER — ROCURONIUM BROMIDE 10 MG/ML (PF) SYRINGE
PREFILLED_SYRINGE | INTRAVENOUS | Status: AC
  Filled 2021-10-17: qty 10

## 2021-10-17 MED ORDER — DEXAMETHASONE SODIUM PHOSPHATE 10 MG/ML IJ SOLN
INTRAMUSCULAR | Status: AC
  Filled 2021-10-17: qty 1

## 2021-10-17 MED ORDER — ACETAMINOPHEN 500 MG PO TABS
1000.0000 mg | ORAL_TABLET | Freq: Four times a day (QID) | ORAL | Status: DC
  Administered 2021-10-17 – 2021-10-19 (×7): 1000 mg via ORAL
  Filled 2021-10-17 (×8): qty 2

## 2021-10-17 MED ORDER — SUGAMMADEX SODIUM 500 MG/5ML IV SOLN
INTRAVENOUS | Status: DC | PRN
  Administered 2021-10-17: 400 mg via INTRAVENOUS

## 2021-10-17 MED ORDER — DEXAMETHASONE SODIUM PHOSPHATE 10 MG/ML IJ SOLN
INTRAMUSCULAR | Status: DC | PRN
  Administered 2021-10-17: 5 mg via INTRAVENOUS

## 2021-10-17 MED ORDER — SODIUM CHLORIDE 0.9 % IV SOLN
2.0000 g | INTRAVENOUS | Status: AC
  Administered 2021-10-17: 2 g via INTRAVENOUS
  Filled 2021-10-17: qty 2

## 2021-10-17 MED ORDER — KETAMINE HCL 10 MG/ML IJ SOLN
INTRAMUSCULAR | Status: AC
  Filled 2021-10-17: qty 1

## 2021-10-17 MED ORDER — LIDOCAINE HCL (PF) 2 % IJ SOLN
INTRAMUSCULAR | Status: AC
  Filled 2021-10-17: qty 5

## 2021-10-17 MED ORDER — HYDROMORPHONE HCL 1 MG/ML IJ SOLN
0.5000 mg | INTRAMUSCULAR | Status: DC | PRN
  Administered 2021-10-17: 0.5 mg via INTRAVENOUS
  Filled 2021-10-17: qty 0.5

## 2021-10-17 MED ORDER — ORAL CARE MOUTH RINSE: 15.0000 mL | Freq: Once | OROMUCOSAL | Status: AC

## 2021-10-17 MED ORDER — MEPERIDINE HCL 50 MG/ML IJ SOLN: 6.2500 mg | INTRAMUSCULAR | Status: DC | PRN

## 2021-10-17 MED ORDER — HYDROMORPHONE HCL 1 MG/ML IJ SOLN
INTRAMUSCULAR | Status: AC
Start: 2021-10-17 — End: 2021-10-17
  Filled 2021-10-17: qty 1

## 2021-10-17 MED ORDER — FENTANYL CITRATE (PF) 100 MCG/2ML IJ SOLN
INTRAMUSCULAR | Status: AC
  Filled 2021-10-17: qty 2

## 2021-10-17 MED ORDER — KETOROLAC TROMETHAMINE 30 MG/ML IJ SOLN
30.0000 mg | Freq: Once | INTRAMUSCULAR | Status: AC | PRN
  Administered 2021-10-17: 30 mg via INTRAVENOUS

## 2021-10-17 MED ORDER — BUPIVACAINE LIPOSOME 1.3 % IJ SUSP
INTRAMUSCULAR | Status: DC | PRN
  Administered 2021-10-17: 20 mL

## 2021-10-17 MED ORDER — ROCURONIUM BROMIDE 10 MG/ML (PF) SYRINGE
PREFILLED_SYRINGE | INTRAVENOUS | Status: DC | PRN
  Administered 2021-10-17 (×2): 30 mg via INTRAVENOUS
  Administered 2021-10-17: 70 mg via INTRAVENOUS

## 2021-10-17 MED ORDER — IBUPROFEN 200 MG PO TABS
600.0000 mg | ORAL_TABLET | Freq: Four times a day (QID) | ORAL | Status: DC | PRN
  Administered 2021-10-17 – 2021-10-19 (×2): 600 mg via ORAL
  Filled 2021-10-17 (×2): qty 1

## 2021-10-17 MED ORDER — BUPIVACAINE LIPOSOME 1.3 % IJ SUSP
INTRAMUSCULAR | Status: AC
  Filled 2021-10-17: qty 20

## 2021-10-17 MED ORDER — ONDANSETRON HCL 4 MG/2ML IJ SOLN: 4.0000 mg | Freq: Four times a day (QID) | INTRAMUSCULAR | Status: DC | PRN

## 2021-10-17 MED ORDER — LIDOCAINE 20MG/ML (2%) 15 ML SYRINGE OPTIME
INTRAMUSCULAR | Status: DC | PRN
Start: 2021-10-17 — End: 2021-10-17
  Administered 2021-10-17: 1.5 mg/kg/h via INTRAVENOUS

## 2021-10-17 MED ORDER — LIDOCAINE HCL 2 % IJ SOLN
INTRAMUSCULAR | Status: AC
  Filled 2021-10-17: qty 20

## 2021-10-17 MED ORDER — ONDANSETRON HCL 4 MG/2ML IJ SOLN
INTRAMUSCULAR | Status: DC | PRN
  Administered 2021-10-17: 4 mg via INTRAVENOUS

## 2021-10-17 MED ORDER — PHENYLEPHRINE 80 MCG/ML (10ML) SYRINGE FOR IV PUSH (FOR BLOOD PRESSURE SUPPORT)
PREFILLED_SYRINGE | INTRAVENOUS | Status: DC | PRN
  Administered 2021-10-17 (×2): 160 ug via INTRAVENOUS

## 2021-10-17 MED ORDER — HEPARIN SODIUM (PORCINE) 5000 UNIT/ML IJ SOLN
5000.0000 [IU] | Freq: Three times a day (TID) | INTRAMUSCULAR | Status: DC
  Administered 2021-10-18 – 2021-10-19 (×4): 5000 [IU] via SUBCUTANEOUS
  Filled 2021-10-17 (×4): qty 1

## 2021-10-17 SURGICAL SUPPLY — 118 items
APPLIER CLIP 5 13 M/L LIGAMAX5 (MISCELLANEOUS)
APPLIER CLIP ROT 10 11.4 M/L (STAPLE)
BAG COUNTER SPONGE SURGICOUNT (BAG) IMPLANT
BLADE EXTENDED COATED 6.5IN (ELECTRODE) ×3 IMPLANT
CANNULA REDUC XI 12-8 STAPL (CANNULA) ×1
CANNULA REDUCER 12-8 DVNC XI (CANNULA) ×2 IMPLANT
CELLS DAT CNTRL 66122 CELL SVR (MISCELLANEOUS) IMPLANT
CHLORAPREP W/TINT 26 (MISCELLANEOUS) ×3 IMPLANT
CLIP APPLIE 5 13 M/L LIGAMAX5 (MISCELLANEOUS) IMPLANT
CLIP APPLIE ROT 10 11.4 M/L (STAPLE) IMPLANT
CLIP LIGATING HEM O LOK PURPLE (MISCELLANEOUS) IMPLANT
CLIP LIGATING HEMO O LOK GREEN (MISCELLANEOUS) IMPLANT
COVER SURGICAL LIGHT HANDLE (MISCELLANEOUS) ×6 IMPLANT
COVER TIP SHEARS 8 DVNC (MISCELLANEOUS) ×2 IMPLANT
COVER TIP SHEARS 8MM DA VINCI (MISCELLANEOUS) ×1
DEFOGGER SCOPE WARMER CLEARIFY (MISCELLANEOUS) ×3 IMPLANT
DERMABOND ADVANCED (GAUZE/BANDAGES/DRESSINGS) ×1
DERMABOND ADVANCED .7 DNX12 (GAUZE/BANDAGES/DRESSINGS) IMPLANT
DEVICE TROCAR PUNCTURE CLOSURE (ENDOMECHANICALS) IMPLANT
DRAIN CHANNEL 19F RND (DRAIN) ×3 IMPLANT
DRAPE ARM DVNC X/XI (DISPOSABLE) ×8 IMPLANT
DRAPE COLUMN DVNC XI (DISPOSABLE) ×2 IMPLANT
DRAPE DA VINCI XI ARM (DISPOSABLE) ×4
DRAPE DA VINCI XI COLUMN (DISPOSABLE) ×1
DRAPE SURG IRRIG POUCH 19X23 (DRAPES) ×3 IMPLANT
DRSG OPSITE POSTOP 4X10 (GAUZE/BANDAGES/DRESSINGS) IMPLANT
DRSG OPSITE POSTOP 4X6 (GAUZE/BANDAGES/DRESSINGS) ×1 IMPLANT
DRSG OPSITE POSTOP 4X8 (GAUZE/BANDAGES/DRESSINGS) IMPLANT
DRSG TEGADERM 2-3/8X2-3/4 SM (GAUZE/BANDAGES/DRESSINGS) ×15 IMPLANT
DRSG TEGADERM 4X4.75 (GAUZE/BANDAGES/DRESSINGS) ×3 IMPLANT
ELECT REM PT RETURN 15FT ADLT (MISCELLANEOUS) ×3 IMPLANT
ENDOLOOP SUT PDS II  0 18 (SUTURE)
ENDOLOOP SUT PDS II 0 18 (SUTURE) IMPLANT
EVACUATOR SILICONE 100CC (DRAIN) ×3 IMPLANT
GAUZE SPONGE 2X2 8PLY STRL LF (GAUZE/BANDAGES/DRESSINGS) ×2 IMPLANT
GAUZE SPONGE 4X4 12PLY STRL (GAUZE/BANDAGES/DRESSINGS) IMPLANT
GLOVE BIO SURGEON STRL SZ7.5 (GLOVE) ×9 IMPLANT
GLOVE INDICATOR 8.0 STRL GRN (GLOVE) ×9 IMPLANT
GOWN SRG XL LVL 4 BRTHBL STRL (GOWNS) ×2 IMPLANT
GOWN STRL NON-REIN XL LVL4 (GOWNS) ×1
GOWN STRL REUS W/ TWL XL LVL3 (GOWN DISPOSABLE) ×10 IMPLANT
GOWN STRL REUS W/TWL XL LVL3 (GOWN DISPOSABLE) ×5
GRASPER SUT TROCAR 14GX15 (MISCELLANEOUS) IMPLANT
HOLDER FOLEY CATH W/STRAP (MISCELLANEOUS) ×3 IMPLANT
IRRIG SUCT STRYKERFLOW 2 WTIP (MISCELLANEOUS) ×6
IRRIGATION SUCT STRKRFLW 2 WTP (MISCELLANEOUS) ×2 IMPLANT
KIT PROCEDURE DA VINCI SI (MISCELLANEOUS) ×1
KIT PROCEDURE DVNC SI (MISCELLANEOUS) IMPLANT
KIT TURNOVER KIT A (KITS) IMPLANT
NDL INSUFFLATION 14GA 120MM (NEEDLE) ×2 IMPLANT
NEEDLE INSUFFLATION 14GA 120MM (NEEDLE) ×3 IMPLANT
PACK CARDIOVASCULAR III (CUSTOM PROCEDURE TRAY) ×3 IMPLANT
PACK COLON (CUSTOM PROCEDURE TRAY) ×3 IMPLANT
PAD POSITIONING PINK XL (MISCELLANEOUS) ×3 IMPLANT
PENCIL SMOKE EVACUATOR (MISCELLANEOUS) IMPLANT
PROTECTOR NERVE ULNAR (MISCELLANEOUS) ×6 IMPLANT
RELOAD STAPLE 45 3.5 BLU DVNC (STAPLE) IMPLANT
RELOAD STAPLE 45 4.3 GRN DVNC (STAPLE) IMPLANT
RELOAD STAPLE 60 3.5 BLU DVNC (STAPLE) IMPLANT
RELOAD STAPLE 60 4.3 GRN DVNC (STAPLE) IMPLANT
RELOAD STAPLER 3.5X45 BLU DVNC (STAPLE) IMPLANT
RELOAD STAPLER 3.5X60 BLU DVNC (STAPLE) IMPLANT
RELOAD STAPLER 4.3X45 GRN DVNC (STAPLE) IMPLANT
RELOAD STAPLER 4.3X60 GRN DVNC (STAPLE) ×2 IMPLANT
RETRACTOR WND ALEXIS 18 MED (MISCELLANEOUS) IMPLANT
RTRCTR WOUND ALEXIS 18CM MED (MISCELLANEOUS)
SCISSORS LAP 5X35 DISP (ENDOMECHANICALS) IMPLANT
SEAL CANN UNIV 5-8 DVNC XI (MISCELLANEOUS) ×8 IMPLANT
SEAL XI 5MM-8MM UNIVERSAL (MISCELLANEOUS) ×4
SEALER VESSEL DA VINCI XI (MISCELLANEOUS) ×1
SEALER VESSEL EXT DVNC XI (MISCELLANEOUS) ×2 IMPLANT
SLEEVE ADV FIXATION 5X100MM (TROCAR) IMPLANT
SOLUTION ELECTROLUBE (MISCELLANEOUS) ×3 IMPLANT
SPIKE FLUID TRANSFER (MISCELLANEOUS) ×3 IMPLANT
SPONGE GAUZE 2X2 STER 10/PKG (GAUZE/BANDAGES/DRESSINGS) ×1
STAPLER CANNULA SEAL DVNC XI (STAPLE) ×2 IMPLANT
STAPLER CANNULA SEAL XI (STAPLE) ×1
STAPLER ECHELON POWER CIR 29 (STAPLE) ×2 IMPLANT
STAPLER ECHELON POWER CIR 31 (STAPLE) IMPLANT
STAPLER RELOAD 3.5X45 BLU DVNC (STAPLE)
STAPLER RELOAD 3.5X45 BLUE (STAPLE)
STAPLER RELOAD 3.5X60 BLU DVNC (STAPLE)
STAPLER RELOAD 3.5X60 BLUE (STAPLE)
STAPLER RELOAD 4.3X45 GREEN (STAPLE)
STAPLER RELOAD 4.3X45 GRN DVNC (STAPLE)
STAPLER RELOAD 4.3X60 GREEN (STAPLE) ×1
STAPLER RELOAD 4.3X60 GRN DVNC (STAPLE) ×2
STOPCOCK 4 WAY LG BORE MALE ST (IV SETS) ×6 IMPLANT
SURGILUBE 2OZ TUBE FLIPTOP (MISCELLANEOUS) ×3 IMPLANT
SUT MNCRL AB 4-0 PS2 18 (SUTURE) ×3 IMPLANT
SUT PDS AB 1 CT1 27 (SUTURE) IMPLANT
SUT PDS AB 1 TP1 96 (SUTURE) IMPLANT
SUT PROLENE 0 CT 2 (SUTURE) IMPLANT
SUT PROLENE 2 0 KS (SUTURE) ×3 IMPLANT
SUT PROLENE 2 0 SH DA (SUTURE) IMPLANT
SUT SILK 2 0 (SUTURE)
SUT SILK 2 0 SH CR/8 (SUTURE) IMPLANT
SUT SILK 2-0 18XBRD TIE 12 (SUTURE) IMPLANT
SUT SILK 3 0 (SUTURE) ×1
SUT SILK 3 0 SH CR/8 (SUTURE) ×3 IMPLANT
SUT SILK 3-0 18XBRD TIE 12 (SUTURE) ×2 IMPLANT
SUT V-LOC BARB 180 2/0GR6 GS22 (SUTURE)
SUT VIC AB 3-0 SH 18 (SUTURE) IMPLANT
SUT VIC AB 3-0 SH 27 (SUTURE)
SUT VIC AB 3-0 SH 27XBRD (SUTURE) IMPLANT
SUT VICRYL 0 UR6 27IN ABS (SUTURE) ×3 IMPLANT
SUTURE V-LC BRB 180 2/0GR6GS22 (SUTURE) IMPLANT
SYR 10ML LL (SYRINGE) ×3 IMPLANT
SYS LAPSCP GELPORT 120MM (MISCELLANEOUS)
SYS WOUND ALEXIS 18CM MED (MISCELLANEOUS) ×3
SYSTEM LAPSCP GELPORT 120MM (MISCELLANEOUS) IMPLANT
SYSTEM WOUND ALEXIS 18CM MED (MISCELLANEOUS) ×2 IMPLANT
TAPE UMBILICAL 1/8 X36 TWILL (MISCELLANEOUS) ×3 IMPLANT
TOWEL OR NON WOVEN STRL DISP B (DISPOSABLE) ×3 IMPLANT
TRAY FOLEY MTR SLVR 16FR STAT (SET/KITS/TRAYS/PACK) ×3 IMPLANT
TROCAR ADV FIXATION 5X100MM (TROCAR) ×3 IMPLANT
TUBING CONNECTING 10 (TUBING) ×6 IMPLANT
TUBING INSUFFLATION 10FT LAP (TUBING) ×3 IMPLANT

## 2021-10-17 NOTE — Op Note (Signed)
PATIENT: Philip Clark  36 y.o. male  Patient Care Team: Libby Maw, MD as PCP - General (Family Medicine) Berniece Salines, DO as PCP - Cardiology (Cardiology)  PREOP DIAGNOSIS: colorectal cancer  POSTOP DIAGNOSIS: colorectal cancer  PROCEDURE:  Robotic assisted low anterior resection with double stapled colorectal anastomosis Intraoperative assessment of perfusion using ICG fluorescence imaging Flexible sigmoidoscopy Bilateral transversus abdominus plane (TAP) blocks  SURGEON: Sharon Mt. Shamra Bradeen, MD  ASSISTANT: Ralene Ok, MD  ANESTHESIA: General endotracheal  EBL: 50 mL Total I/O In: 1320 [I.V.:1220; IV Piggyback:100] Out: 120 [Urine:70; Blood:50]  DRAINS: None  SPECIMEN:  Rectosigmoid colon - open end proxima Distal anastomotic donut ('final distal margin')  COUNTS: Sponge, needle and instrument counts were reported correct x2  FINDINGS: Tattoo in distal sigmoid colon with mass just proximal. No evident extension beyond the wall of the colon or involvement of surrounding structures. No evident metastatic disease on visceral/parietal peritoneum or liver. A well perfused, tension free, hemostatic, air tight 29 mm EEA colorectal anastomosis fashioned 15 cm from the anal verge by flexible sigmoidoscopy.   NARRATIVE: Informed consent was verified. He was taken to the operating room, placed supine on the operating table and SCD's were applied. General endotracheal anesthesia was induced without difficulty. He was then positioned in the lithotomy position with Allen stirrups.  Pressure points were evaluated and padded.  A foley catheter was then placed by nursing under sterile conditions. Hair on the abdomen was clipped.  He was secured to the operating table. The abdomen was then prepped and draped in the standard sterile fashion. Surgical timeout was called indicating the correct patient, procedure, positioning and need for preoperative antibiotics.   An OG  tube was placed by anesthesia and confirmed to be to suction.  At Palmer's point, a stab incision was created and the Veress needle was introduced into the peritoneal cavity on the first attempt.  Intraperitoneal location was confirmed by the aspiration and saline drop test.  Pneumoperitoneum was established to a maximum pressure of 15 mmHg using CO2.  Following this, the abdomen was marked for planned trocar sites.  Just to the right and cephalad to the umbilicus, an 8 mm incision was created and an 8 mm blunt tipped robotic trocar was cautiously placed into the peritoneal cavity.  The laparoscope was inserted and demonstrated no evidence of trocar site nor Veress needle site complications.  The Veress needle was removed.  Bilateral transversus abdominis plane blocks were then created using a dilute mixture of Exparel with Marcaine.  3 additional 8 mm robotic trochars were placed under direct visualization roughly in a line extending from the right ASIS towards the left upper quadrant. The bladder was inspected and noted to be at/below the pubic symphysis.  Staying 3 fingerbreadths above the pubic symphysis, an incision was created and the 12 mm robotic trocar inserted directed cephalad into the peritoneal cavity under direct visualization.  An additional 5 mm assist port was placed in the right lateral abdomen under direct visualization.  The abdomen was surveyed and there was normal peritoneal surfaces. Mass in sigmoid colon with tattoo distal to it in distal sigmoid colon. Liver surface is normal in appearance. He was positioned in Trendelenburg with the left side tilted slightly up.  Small bowel was carefully retracted out of the pelvis.  The robot was then docked and I went to the console.   The sigmoid colon was readily identified and retracted medially. Attachments of the sigmoid colon were taken down from the  intersigmoid fossa.  The rectosigmoid colon was grasped and elevated anteriorly.  Beginning with  a medial to lateral approach, the peritoneum overlying the presacral space was carefully incised.  The TME plane was readily gained working in a plane between the fascia propria of the rectum and the presacral fascia.  Hypogastric nerves were seen going along the the presacral fascia and were protected free of injury.  Working more proximally, the mesorectum and sigmoid mesentery were carefully mobilized off of the peritoneum.  The left ureter was identified and protected free of injury.  The left gonadal vessels were identified and protected.  These were both swept "down."  The superior hemorrhoidal and IMA pedicles were identified. Further mesocolon was mobilized proximally staying in this plane between the retroperitoneum proper and the mesocolon. Attention was then turned to the lateral portion of dissection.  The sigmoid colon was then retracted to the right.  The sigmoid colon was fully mobilized. The descending colon was mobilized by incising the Opha Mcghee line of Toldt.  This was done all the way up to the level of the splenic flexure.  The associated mesocolon was also mobilized medially.  The left ureter again was confirmed to be well away from the vasculature which had been dissected medially.  The rectosigmoid colon was elevated anteriorly. The left ureter was re-identified. The IMA was clear of this. The IMA was then divided with the vessel sealer near its origin off the aorta. The stump was inspected and noted to be completely hemostatic with a good seal.  The mesentery was divided out to the point of planned proximal division.  Working more distally, the rectum was identified. Working toward a 5 cm distal margin, this corresponds to where the tinea had splayed and there were loss of appendices epiploica.  This also corresponded to a location overlying the sacral promontory.  Anatomically, this clearly represents the proximal rectum.  The mesentery out to this level was then cleared using the vessel  sealer. The distal point of transection on the proximal rectum was identified.   A 60 mm green load robotic stapler was then placed through the 12 mm port and introduced into the peritoneal cavity under direct visualization.  The rectum was divided with 1 firing of the stapler.  The stump was intact and healthy in appearance.   Attention was turned to performing a perfusion test. ICG was administered by anesthesia and at the level of the cleared mesentery proximally, there was excellent uptake of the tracer.  The rectum is also well perfused in appearance.  There is a visible pulse in the mesentery out to the level of the cleared colon at the level of the proximal sigmoid/descending colon junction.  This colon is also supple and healthy in appearance without any thickening.  This reached into the pelvis without any difficulty and remained in that location without any tension. A locking grasper was then placed on the sigmoid staple line.   Attention was turned to the extracorporeal portion of the procedure.  The robot was undocked.  I scrubbed back in.  Using the 12 mm trocar site, a Pfannenstiel incision was created and incorporated the fascial opening through the 12 mm port site.  The rectus fascia was incised and then elevated.  The rectus muscle was mobilized free of the overlying fascia.  The peritoneum was incised in the midline well above the location of the bladder.  An [place] wound protector was placed.  Towels were placed around the field.  The  divided colon was passed through the wound protector.  The point of proximal division was identified and was again on a healthy segment of supple colon with a palpable pulse in the mesentery. This was pink in color.  A pursestring device was applied.  A 2-0 Prolene on a Keith needle was passed.  The colon was divided and passed off with the open end being proximal.  EEA sizers were then introduced and a 29 mm EEA selected.  "Belt loops" consisting of 3-0 silk  were placed around the pursestring suture line.  The anvil was placed and the pursestring tied.  A small amount of fat was cleared from the planned anastomosis and no diverticula were apparent within this.  This was placed back into the abdomen and a cap placed over the wound protector port site.  Pneumoperitoneum was reestablished.  I then went below to pass the stapler.  My partner remained above.  EEA sizers were cautiously introduced via the anus and advanced under direct visualization.  The stapler was passed and the spike deployed just anterior to the staple line.  The components were then mated.  Orientation was confirmed such that there is no twisting of the colon nor small bowel underneath the mesenteric defect. Care was taken to ensure no other structures were incorporated within this either.  The stapler was then closed, held, and fired. This was then removed. The donuts were inspected and noted to be complete.  The colon proximal to the anastomosis was then gently occluded. The pelvis was filled with sterile irrigation. Under direct visualization, I passed a flexible sigmoidoscope.  The anastomosis was under water.  With good distention of the anastomosis there was no air leak. The anastomosis pink in appearance.  This is located at 15 cm from the anal verge by flexible sigmoidoscopy.  It is hemostatic.  Additionally, looking from above, there is no tension on the colon or mesentery.  Sigmoidoscope was withdrawn.  Irrigation was evacuated from the pelvis.  The abdomen and pelvis are surveyed and noted to be completely hemostatic without any apparent injury.  Under direct visualization, all trochars are removed.  The [place] wound protector was removed.  Gowns/gloves are changed and a fresh set of clean instruments utilized. Additional sterile drapes were placed around the field.   The Pfannenstiel peritoneum was closed with a running 0 Vicryl suture.  The rectus fascia was then closed using 2 running #1  PDS sutures, tied centrally.  The fascia was then palpated and noted to be completely closed.  Additional anesthetic was infiltrated at the Pfannenstiel site.  All sponge, needle, and instrument counts were reported correct x2. 4-0 Monocryl subcuticular suture was used to close the skin of all incision sites.  Dermabond was placed over all incisions.  A honeycomb dressing placed over the Pfannenstiel as well.   He was then taken out of lithotomy, awakened from anesthesia, extubated, and transferred to a stretcher for transport to PACU in satisfactory condition having tolerated the procedure well.

## 2021-10-17 NOTE — Discharge Instructions (Signed)
POST OP INSTRUCTIONS  DIET: As tolerated. Follow a light bland diet the first 24 hours after arrival home, such as soup, liquids, crackers, etc.  Be sure to include lots of fluids daily.  Avoid fast food or heavy meals as your are more likely to get nauseated.  Eat a low fat the next few days after surgery.  Take your usually prescribed home medications unless otherwise directed.  PAIN CONTROL: Pain is best controlled by a usual combination of three different methods TOGETHER: Ice/Heat Over the counter pain medication Prescription pain medication Most patients will experience some swelling and bruising around the surgical site.  Ice packs or heating pads (30-60 minutes up to 6 times a day) will help. Some people prefer to use ice alone, heat alone, alternating between ice & heat.  Experiment to what works for you.  Swelling and bruising can take several weeks to resolve.   It is helpful to take an over-the-counter pain medication regularly for the first few weeks: Ibuprofen (Motrin/Advil) - 200mg tabs - take 3 tabs (600mg) every 6 hours as needed for pain Acetaminophen (Tylenol) - you may take 650mg every 6 hours as needed. You can take this with motrin as they act differently on the body. If you are taking a narcotic pain medication that has acetaminophen in it, do not take over the counter tylenol at the same time.  Iii. NOTE: You may take both of these medications together - most patients  find it most helpful when alternating between the two (i.e. Ibuprofen at 6am,  tylenol at 9am, ibuprofen at 12pm ...) A  prescription for pain medication should be given to you upon discharge.  Take your pain medication as prescribed if your pain is not adequatly controlled with the over-the-counter pain reliefs mentioned above.  Avoid getting constipated.  Between the surgery and the pain medications, it is common to experience some constipation.  Increasing fluid intake and taking a fiber supplement (such as  Metamucil, Citrucel, FiberCon, MiraLax, etc) 1-2 times a day regularly will usually help prevent this problem from occurring.  A mild laxative (prune juice, Milk of Magnesia, MiraLax, etc) should be taken according to package directions if there are no bowel movements after 48 hours.    Dressing: Your incision is covered in Dermabond which is like sterile superglue for the skin. This will come off on it's own in a couple weeks. It is waterproof and you may bathe normally starting the day after your surgery in a shower. Avoid baths/pools/lakes/oceans until your wounds have fully healed.  ACTIVITIES as tolerated:   Avoid heavy lifting (>10lbs or 1 gallon of milk) for the next 6 weeks. You may resume regular (light) daily activities beginning the next day--such as daily self-care, walking, climbing stairs--gradually increasing activities as tolerated.  If you can walk 30 minutes without difficulty, it is safe to try more intense activity such as jogging, treadmill, bicycling, low-impact aerobics.  DO NOT PUSH THROUGH PAIN.  Let pain be your guide: If it hurts to do something, don't do it. You may drive when you are no longer taking prescription pain medication, you can comfortably wear a seatbelt, and you can safely maneuver your car and apply brakes.   FOLLOW UP in our office Please call [facility] at [phone] to set up an appointment to see your surgeon in the office for a follow-up appointment approximately 2 weeks after your surgery. Make sure that you call for this appointment the day you arrive home to   insure a convenient appointment time.  9. If you have disability or family leave forms that need to be completed, you may have them completed by your primary care physician's office; for return to work instructions, please ask our office staff and they will be happy to assist you in obtaining this documentation   When to call us [phone]: Poor pain control Reactions / problems with new  medications (rash/itching, etc)  Fever over 101.5 F (38.5 C) Inability to urinate Nausea/vomiting Worsening swelling or bruising Continued bleeding from incision. Increased pain, redness, or drainage from the incision  The clinic staff is available to answer your questions during regular business hours (8:30am-5pm).  Please don't hesitate to call and ask to speak to one of our nurses for clinical concerns.   A surgeon from [facility] is always on call at the hospitals   If you have a medical emergency, go to the nearest emergency room or call 911.  [facility] [address], [address], [place], [place]  [address] MAIN: [phone] FAX: [phone] [email or web address]  

## 2021-10-17 NOTE — Transfer of Care (Signed)
Immediate Anesthesia Transfer of Care Note  Patient: Philip Clark  Procedure(s) Performed: XI ROBOTIC ASSISTED LOWER ANTERIOR RESECTION FLEXIBLE SIGMOIDOSCOPY, INTRAOPERATIVE ASSESSMENT OF PERFUSION  Patient Location: PACU  Anesthesia Type:General  Level of Consciousness: awake, alert , oriented and patient cooperative  Airway & Oxygen Therapy: Patient Spontanous Breathing and Patient connected to face mask oxygen  Post-op Assessment: Report given to RN and Post -op Vital signs reviewed and stable  Post vital signs: Reviewed and stable  Last Vitals:  Vitals Value Taken Time  BP 141/94 10/17/21 1105  Temp    Pulse 107 10/17/21 1106  Resp 9 10/17/21 1106  SpO2 100 % 10/17/21 1106  Vitals shown include unvalidated device data.  Last Pain:  Vitals:   10/17/21 0657  TempSrc: Oral  PainSc: 0-No pain      Patients Stated Pain Goal: 4 (12/92/90 9030)  Complications: No notable events documented.

## 2021-10-17 NOTE — H&P (Signed)
CC: Here today for surgery  HPI: Philip Clark is an 36 y.o. male whom was seen in the office as a referral by Dr. Bryan Lemma for evaluation of colon.   Cscope 09/11/21 - 1. 8 mm polyp in the ascending colon removed 2. 4 mm polyp in the sigmoid removed. 3. Frond-like villous and ulcerated partially obstructing large mass in the sigmoid colon circumferential. 6 cm in length located 20 to 26 cm from the verge. Biopsied. Tattoo just distal to it. 4. 2 mm polyp in the rectum removed. 5. Retroflexion normal. 6. Normal TI.  Pathology is currently pending.  CT chest/abdomen/pelvis as well as a pelvic MRI are pending.  He reports that beginning in October, 2022 he began having some vague crampy-like sensations and occasional rectal bleeding. He is here today with his wife. They report he was initially being evaluated for possible ulcerative colitis. Ultimately did however have the colonoscopy noted above.  He reports he will have occasional crampy-like sensations in his belly that begins in his back and radiates forward. He will also have sensations of urgency. He denies any nausea or vomiting. His appetite has been good.  Tolerated bowel prep with good result. No complaints or changes in his health or health hx since we met in the office. States he is ready for surgery.  PMH: Denies  PSH: Left ACL repair  FHx: Paternal grandmother had colon cancer. Maternal grandfather had colon cancer. He denies any other known family history of colorectal, breast, endometrial or ovarian cancer  Social Hx: Social EtOH use. Denies use of tobacco/illicit drug. 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. He is here today with his wife whom works over at [employer] surgery center  Review of Systems: A complete review of systems was obtained from the patient. I have reviewed this information and discussed as appropriate with the patient. See HPI as well for other ROS.  Past Medical  History:  Diagnosis Date   Allergy    Anemia    Anxiety    Bronchitis    Cancer ([place])    Depression     Past Surgical History:  Procedure Laterality Date   ANTERIOR CRUCIATE LIGAMENT (ACL) REVISION Left     Family History  Problem Relation Age of Onset   Anxiety disorder Mother    Heart disease Father 33       MI x 2   Skin cancer Father        basal or squamous cell   Dementia Maternal Grandmother    Heart disease Maternal Grandfather 13       CAD with CABG   Bladder Cancer Maternal Grandfather        dx. 22s   Colon cancer Paternal Grandmother        dx. 60s/70s   Early death Paternal Grandfather 50       CAD/MI   Rectal cancer Neg Hx    Stomach cancer Neg Hx    Esophageal cancer Neg Hx     Social:  reports that he has never smoked. He has never used smokeless tobacco. He reports current alcohol use of about 1.0 standard drink per week. He reports that he does not use drugs.  Allergies: No Known Allergies  Medications: I have reviewed the patient's current medications.  Results for orders placed or performed during the hospital encounter of 10/17/21 (from the past 48 hour(s))  ABO/Rh     Status: None   Collection Time:  10/17/21  7:05 AM  Result Value Ref Range   ABO/RH(D)      O POS Performed at [facility], [place] [address], [place], [place] [address]     No results found.  ROS - all of the below systems have been reviewed with the patient and positives are indicated with bold text General: chills, fever or night sweats Eyes: blurry vision or double vision ENT: epistaxis or sore throat Allergy/Immunology: itchy/watery eyes or nasal congestion Hematologic/Lymphatic: bleeding problems, blood clots or swollen lymph nodes Endocrine: temperature intolerance or unexpected weight changes Breast: new or changing breast lumps or nipple discharge Resp: cough, shortness of breath, or wheezing CV: chest pain or dyspnea on exertion GI: as per  HPI GU: dysuria, trouble voiding, or hematuria MSK: joint pain or joint stiffness Neuro: TIA or stroke symptoms Derm: pruritus and skin lesion changes Psych: anxiety and depression  PE Blood pressure 140/82, pulse 100, temperature 98.7 F (37.1 C), temperature source Oral, height 6' 2"  (1.88 m), weight 109.3 kg, SpO2 (!) 10 %. Constitutional: NAD; conversant Eyes: Moist conjunctiva; no lid lag; anicteric Lungs: Normal respiratory effort CV: RRR; GI: Abd soft, NT/ND; no palpable hepatosplenomegaly MSK: Normal range of motion of extremities Psychiatric: Appropriate affect; alert and oriented x3  Results for orders placed or performed during the hospital encounter of 10/17/21 (from the past 48 hour(s))  ABO/Rh     Status: None   Collection Time: 10/17/21  7:05 AM  Result Value Ref Range   ABO/RH(D)      O POS Performed at [facility], [place] [address], [place], [place] [address]     No results found.    A/P: Philip Clark is an 36 y.o. male with colon mass at 20-26 cm on colonoscopy 09/11/21  Pathology demonstrated invasive adenocarcinoma  1. Short segment asymmetric wall thickening of the sigmoid colon is consistent with patient's known sigmoid colon cancer. 2. Prominent lymph nodes in the sigmoid mesentery measure up to 6 mm in short axis, which likely reflect local nodal disease involvement. 3. No evidence of distant metastatic disease in the chest, abdomen or pelvis. 4. No evidence of high-grade bowel obstruction. However, there is a large volume of formed stool throughout the colon   Pelvic MRI suggests sigmoid primary  - Genetics referral -The anatomy and physiology of the GI tract was reviewed with the patient. The pathophysiology of colorectal was discussed as well with associated pictures.  -We have discussed various different treatment options going forward including surgery to address this - robotic assisted sigmoidectomy/low anterior  resection, flexible sigmoidoscopy, intraoperative assessment of perfusion (ICG), bilateral TAP blocks. All of this provided there is no evidence of metastatic disease on his CT scans and that this actually resides in the sigmoid colon as opposed to the rectum. MRI will be helpful in determining the distal extent of the mass.  -The planned procedures, material risks (including, but not limited to, pain, bleeding, infection, scarring, need for blood transfusion, damage to surrounding structures- blood vessels/nerves/viscus/organs, damage to ureter, urine leak, leak from anastomosis, need for additional procedures, scenarios where a stoma may be necessary and where it may be permanent, worsening of pre-existing medical conditions, hernia, recurrence, pneumonia, heart attack, stroke, death) benefits and alternatives to surgery were discussed at length. The patient's questions were answered to his satisfaction, he voiced understanding and elected to proceed with surgery. Additionally, we discussed typical postoperative expectations and the recovery process.   Nadeen Landau, [facility],  [place]

## 2021-10-17 NOTE — Anesthesia Postprocedure Evaluation (Signed)
Anesthesia Post Note  Patient: Philip Clark  Procedure(s) Performed: XI ROBOTIC ASSISTED LOWER ANTERIOR RESECTION (Abdomen) FLEXIBLE SIGMOIDOSCOPY, INTRAOPERATIVE ASSESSMENT OF PERFUSION (Rectum)     Patient location during evaluation: PACU Anesthesia Type: General Level of consciousness: sedated Pain management: pain level controlled Vital Signs Assessment: post-procedure vital signs reviewed and stable Respiratory status: spontaneous breathing Cardiovascular status: stable Postop Assessment: no apparent nausea or vomiting Anesthetic complications: no   No notable events documented.  Last Vitals:  Vitals:   10/17/21 1145 10/17/21 1200  BP: (!) 135/95 (!) 134/91  Pulse: 89 97  Resp: 18 16  Temp:  36.7 C  SpO2: 100% 100%    Last Pain:  Vitals:   10/17/21 1200  TempSrc:   PainSc: [name]

## 2021-10-17 NOTE — Anesthesia Procedure Notes (Signed)
Procedure Name: Intubation Date/Time: 10/17/2021 8:35 AM Performed by: Lollie Sails, CRNA Pre-anesthesia Checklist: Patient identified, Emergency Drugs available, Suction available, Patient being monitored and Timeout performed Patient Re-evaluated:Patient Re-evaluated prior to induction Oxygen Delivery Method: Circle system utilized Preoxygenation: Pre-oxygenation with 100% oxygen Induction Type: IV induction Ventilation: Mask ventilation without difficulty Laryngoscope Size: Miller and 3 Grade View: Grade I Tube type: Oral Tube size: 8.0 mm Number of attempts: 1 Airway Equipment and Method: Stylet Placement Confirmation: ETT inserted through vocal cords under direct vision, positive ETCO2 and breath sounds checked- equal and bilateral Secured at: 24 cm Tube secured with: Tape Dental Injury: Teeth and Oropharynx as per pre-operative assessment

## 2021-10-18 ENCOUNTER — Other Ambulatory Visit (HOSPITAL_COMMUNITY): Payer: Self-pay

## 2021-10-18 ENCOUNTER — Encounter (HOSPITAL_COMMUNITY): Payer: Self-pay | Admitting: Surgery

## 2021-10-18 LAB — CBC
HCT: 34.9 % — ABNORMAL LOW (ref 39.0–52.0)
Hemoglobin: 11.1 g/dL — ABNORMAL LOW (ref 13.0–17.0)
MCH: 27 pg (ref 26.0–34.0)
MCHC: 31.8 g/dL (ref 30.0–36.0)
MCV: 84.9 fL (ref 80.0–100.0)
Platelets: 333 10*3/uL (ref 150–400)
RBC: 4.11 MIL/uL — ABNORMAL LOW (ref 4.22–5.81)
RDW: 14.2 % (ref 11.5–15.5)
WBC: 13.6 10*3/uL — ABNORMAL HIGH (ref 4.0–10.5)
nRBC: 0 % (ref 0.0–0.2)

## 2021-10-18 LAB — BASIC METABOLIC PANEL
Anion gap: 6 (ref 5–15)
BUN: 10 mg/dL (ref 6–20)
CO2: 26 mmol/L (ref 22–32)
Calcium: 8.8 mg/dL — ABNORMAL LOW (ref 8.9–10.3)
Chloride: 108 mmol/L (ref 98–111)
Creatinine, Ser: 1.14 mg/dL (ref 0.61–1.24)
GFR, Estimated: >60 mL/min (ref >60)
Glucose, Bld: 105 mg/dL — ABNORMAL HIGH (ref 70–99)
Potassium: 3.6 mmol/L (ref 3.5–5.1)
Sodium: 140 mmol/L (ref 135–145)

## 2021-10-18 MED ORDER — TRAMADOL HCL 50 MG PO TABS
50.0000 mg | ORAL_TABLET | Freq: Four times a day (QID) | ORAL | 0 refills | Status: AC | PRN
Start: 2021-10-18 — End: 2021-10-23
  Filled 2021-10-18: qty 15, 4d supply, fill #0

## 2021-10-18 NOTE — Plan of Care (Signed)
  Problem: Education: Goal: Knowledge of General Education information will improve Description: Including pain rating scale, medication(s)/side effects and non-pharmacologic comfort measures 10/18/2021 0013 by Kennith Center, RN Outcome: Progressing 10/18/2021 0012 by Kennith Center, RN Outcome: Progressing   Problem: Activity: Goal: Risk for activity intolerance will decrease 10/18/2021 0013 by Kennith Center, RN Outcome: Progressing 10/18/2021 0012 by Kennith Center, RN Outcome: Progressing   Problem: Nutrition: Goal: Adequate nutrition will be maintained 10/18/2021 0013 by Kennith Center, RN Outcome: Progressing 10/18/2021 0012 by Kennith Center, RN Outcome: Progressing   Problem: Coping: Goal: Level of anxiety will decrease Outcome: Progressing

## 2021-10-18 NOTE — Progress Notes (Signed)
Transition of Care (TOC) Screening Note  Patient Details  Name: Virgle Heuring Date of Birth: 30-Apr-1986  Transition of Care [facility]) CM/SW Contact:    Sherie Don, LCSW Phone Number: 10/18/2021, 10:57 AM  Transition of Care Department [facility]) has reviewed patient and no TOC needs have been identified at this time. We will continue to monitor patient advancement through interdisciplinary progression rounds. If new patient transition needs arise, please place a TOC consult.

## 2021-10-18 NOTE — Progress Notes (Signed)
  Subjective No acute events. Feeling well. No n/v with full liquids. Ambulating. Voiding. Passing flatus, had BM as well. Denies distention.  Objective: Vital signs in last 24 hours: Temp:  [97.9 F (36.6 C)-98.9 F (37.2 C)] 97.9 F (36.6 C) (05/25 0554) Pulse Rate:  [72-107] 82 (05/25 0554) Resp:  [10-18] 18 (05/25 0554) BP: (114-141)/(70-95) 127/85 (05/25 0554) SpO2:  [96 %-100 %] 100 % (05/25 0554) Last BM Date : 10/17/21  Intake/Output from previous day: 05/24 0701 - 05/25 0700 In: 4148.8 [P.O.:1560; I.V.:2488.8; IV Piggyback:100] Out: 0623 [Urine:3770; Blood:50] Intake/Output this shift: No intake/output data recorded.  Gen: NAD, comfortable CV: RRR Pulm: Normal work of breathing Abd: Soft, appropriate incisional soreness, no tenderness. Nondistended. Incisions c/d/i Ext: SCDs in place  Lab Results: CBC  Recent Labs    10/18/21 0652  WBC 13.6*  HGB 11.1*  HCT 34.9*  PLT 333   BMET Recent Labs    10/18/21 0652  NA 140  K 3.6  CL 108  CO2 26  GLUCOSE 105*  BUN 10  CREATININE 1.14  CALCIUM 8.8*   PT/INR No results for input(s): LABPROT, INR in the last 72 hours. ABG No results for input(s): PHART, HCO3 in the last 72 hours.  Invalid input(s): PCO2, PO2  Studies/Results:  Anti-infectives: Anti-infectives (From admission, onward)    Start     Dose/Rate Route Frequency Ordered Stop   10/17/21 1400  neomycin (MYCIFRADIN) tablet 1,000 mg  Status:  Discontinued       See Hyperspace for full Linked Orders Report.   1,000 mg Oral 3 times per day 10/17/21 0650 10/17/21 0653   10/17/21 1400  metroNIDAZOLE (FLAGYL) tablet 1,000 mg  Status:  Discontinued       See Hyperspace for full Linked Orders Report.   1,000 mg Oral 3 times per day 10/17/21 0650 10/17/21 0653   10/17/21 0700  cefoTEtan (CEFOTAN) 2 g in sodium chloride 0.9 % 100 mL IVPB        2 g 200 mL/hr over 30 Minutes Intravenous On call to O.R. 10/17/21 0650 10/17/21 0856         Assessment/Plan: Patient Active Problem List   Diagnosis Date Noted   S/P laparoscopic-assisted sigmoidectomy 10/17/2021   Adenocarcinoma of sigmoid colon ([place]) 10/02/2021   IDA (iron deficiency anemia) 08/13/2021   Heme positive stool 07/02/2021   Hematochezia 07/02/2021   Lower abdominal pain 07/02/2021   Anemia 07/02/2021   Elevated pulse rate 04/17/2021   Viral syndrome 04/17/2021   Rib pain 10/19/2020   Lumbar radiculopathy 10/19/2020   Rib cage dysfunction 10/18/2020   Cauda equina injury without bone injury ([place]) 10/18/2020   Bronchitis    Anxiety    Allergy    S/P ACL reconstruction 07/16/2016   Injury of left knee 05/28/2016   Knee LCL sprain 11/24/2014   s/p Procedure(s): XI ROBOTIC ASSISTED LOWER ANTERIOR RESECTION FLEXIBLE SIGMOIDOSCOPY, INTRAOPERATIVE ASSESSMENT OF PERFUSION 10/17/2021  -We spent time reviewing his procedure, findings and plans. Answered all related questions -Adv to soft diet -Ambulate 5x/day -D/C IVF -D/C Entereg -PPX: SQH, SCDs   LOS: 1 day   Nadeen Landau, MD [facility], [place]

## 2021-10-19 ENCOUNTER — Other Ambulatory Visit: Payer: Self-pay

## 2021-10-19 DIAGNOSIS — C187 Malignant neoplasm of sigmoid colon: Secondary | ICD-10-CM | POA: Diagnosis not present

## 2021-10-19 LAB — BASIC METABOLIC PANEL
Anion gap: 4 — ABNORMAL LOW (ref 5–15)
BUN: 9 mg/dL (ref 6–20)
CO2: 27 mmol/L (ref 22–32)
Calcium: 8.7 mg/dL — ABNORMAL LOW (ref 8.9–10.3)
Chloride: 110 mmol/L (ref 98–111)
Creatinine, Ser: 1.2 mg/dL (ref 0.61–1.24)
GFR, Estimated: >60 mL/min (ref >60)
Glucose, Bld: 88 mg/dL (ref 70–99)
Potassium: 3.9 mmol/L (ref 3.5–5.1)
Sodium: 141 mmol/L (ref 135–145)

## 2021-10-19 LAB — CBC
HCT: 33 % — ABNORMAL LOW (ref 39.0–52.0)
Hemoglobin: 10.5 g/dL — ABNORMAL LOW (ref 13.0–17.0)
MCH: 27.2 pg (ref 26.0–34.0)
MCHC: 31.8 g/dL (ref 30.0–36.0)
MCV: 85.5 fL (ref 80.0–100.0)
Platelets: 278 10*3/uL (ref 150–400)
RBC: 3.86 MIL/uL — ABNORMAL LOW (ref 4.22–5.81)
RDW: 14.2 % (ref 11.5–15.5)
WBC: 9 10*3/uL (ref 4.0–10.5)
nRBC: 0 % (ref 0.0–0.2)

## 2021-10-19 NOTE — Discharge Summary (Signed)
Patient ID: Clay Solum MRN: [id number] DOB/AGE: 1986-03-02 36 y.o.  Admit date: 10/17/2021 Discharge date: 10/19/2021  Discharge Diagnoses Patient Active Problem List   Diagnosis Date Noted   S/P laparoscopic-assisted sigmoidectomy 10/17/2021   Adenocarcinoma of sigmoid colon ([place]) 10/02/2021   IDA (iron deficiency anemia) 08/13/2021   Heme positive stool 07/02/2021   Hematochezia 07/02/2021   Lower abdominal pain 07/02/2021   Anemia 07/02/2021   Elevated pulse rate 04/17/2021   Viral syndrome 04/17/2021   Rib pain 10/19/2020   Lumbar radiculopathy 10/19/2020   Rib cage dysfunction 10/18/2020   Cauda equina injury without bone injury ([place]) 10/18/2020   Bronchitis    Anxiety    Allergy    S/P ACL reconstruction 07/16/2016   Injury of left knee 05/28/2016   Knee LCL sprain 11/24/2014    Consultants none  Procedures Robotic assisted low anterior resection with double stapled colorectal anastomosis Intraoperative assessment of perfusion using ICG fluorescence imaging Flexible sigmoidoscopy Bilateral transversus abdominus plane (TAP) blocks  Hospital Course: he was admitted postoperatively and recovered uneventfully. He is comfortable with and stable for discharge home today. Pathology pending. Medical oncology referral being placed by our office today.   Allergies as of 10/19/2021   No Known Allergies      Medication List     TAKE these medications    cholecalciferol 25 MCG (1000 UNIT) tablet Commonly known as: VITAMIN D3 Take 1,000 Units by mouth daily.   FISH OIL PO Take 1 capsule by mouth daily.   multivitamin with minerals Tabs tablet Take 1 tablet by mouth daily.   traMADol 50 MG tablet Commonly known as: Ultram Take 1 tablet by mouth every 6 hours as needed for up to 5 days (postop pain not controlled with tylenol and ibuprofen first).   vitamin C 100 MG tablet Take 100 mg by mouth daily.   zinc gluconate 50 MG tablet Take 50 mg by mouth  daily.          Follow-up Information     Ileana Roup, MD Follow up in 3 week(s).   Specialties: General Surgery, Colon and Rectal Surgery Contact information: [place] [id number] [place] Dema Severin, M.D. [facility]

## 2021-10-19 NOTE — Progress Notes (Signed)
  Subjective No acute events. Feeling well, slept well. Ambulating, voiding, having flatus and Bms, tolerating diet.   Objective: Vital signs in last 24 hours: Temp:  [97.5 F (36.4 C)-97.8 F (36.6 C)] 97.8 F (36.6 C) (05/26 0541) Pulse Rate:  [78-91] 78 (05/26 0541) Resp:  [18] 18 (05/26 0541) BP: (114-130)/(77-85) 114/77 (05/26 0541) SpO2:  [96 %-99 %] 96 % (05/26 0541) Last BM Date : 10/18/21  Intake/Output from previous day: 05/25 0701 - 05/26 0700 In: 1440 [P.O.:1440] Out: 1050 [Urine:1050] Intake/Output this shift: No intake/output data recorded.  Gen: NAD, comfortable CV: RRR Pulm: Normal work of breathing Abd: Soft, NT/ND. Incisions c/d/I without erythema or drainage Ext: SCDs in place  Lab Results: CBC  Recent Labs    10/18/21 0652 10/19/21 0455  WBC 13.6* 9.0  HGB 11.1* 10.5*  HCT 34.9* 33.0*  PLT 333 278   BMET Recent Labs    10/18/21 0652 10/19/21 0455  NA 140 141  K 3.6 3.9  CL 108 110  CO2 26 27  GLUCOSE 105* 88  BUN 10 9  CREATININE 1.14 1.20  CALCIUM 8.8* 8.7*   PT/INR No results for input(s): LABPROT, INR in the last 72 hours. ABG No results for input(s): PHART, HCO3 in the last 72 hours.  Invalid input(s): PCO2, PO2  Studies/Results:  Anti-infectives: Anti-infectives (From admission, onward)    Start     Dose/Rate Route Frequency Ordered Stop   10/17/21 1400  neomycin (MYCIFRADIN) tablet 1,000 mg  Status:  Discontinued       See Hyperspace for full Linked Orders Report.   1,000 mg Oral 3 times per day 10/17/21 0650 10/17/21 0653   10/17/21 1400  metroNIDAZOLE (FLAGYL) tablet 1,000 mg  Status:  Discontinued       See Hyperspace for full Linked Orders Report.   1,000 mg Oral 3 times per day 10/17/21 0650 10/17/21 0653   10/17/21 0700  cefoTEtan (CEFOTAN) 2 g in sodium chloride 0.9 % 100 mL IVPB        2 g 200 mL/hr over 30 Minutes Intravenous On call to O.R. 10/17/21 0650 10/17/21 0856        Assessment/Plan: Patient  Active Problem List   Diagnosis Date Noted   S/P laparoscopic-assisted sigmoidectomy 10/17/2021   Adenocarcinoma of sigmoid colon ([place]) 10/02/2021   IDA (iron deficiency anemia) 08/13/2021   Heme positive stool 07/02/2021   Hematochezia 07/02/2021   Lower abdominal pain 07/02/2021   Anemia 07/02/2021   Elevated pulse rate 04/17/2021   Viral syndrome 04/17/2021   Rib pain 10/19/2020   Lumbar radiculopathy 10/19/2020   Rib cage dysfunction 10/18/2020   Cauda equina injury without bone injury ([place]) 10/18/2020   Bronchitis    Anxiety    Allergy    S/P ACL reconstruction 07/16/2016   Injury of left knee 05/28/2016   Knee LCL sprain 11/24/2014   s/p Procedure(s): XI ROBOTIC ASSISTED LOWER ANTERIOR RESECTION FLEXIBLE SIGMOIDOSCOPY, INTRAOPERATIVE ASSESSMENT OF PERFUSION 10/17/2021  -Doing great, recovering as expected -Path pending -Reviewed postop expectations, plans, answered related questions -He is comfortable with and stable for discharge home   LOS: 2 days   Nadeen Landau, MD [facility], [place]

## 2021-10-23 ENCOUNTER — Telehealth: Payer: Self-pay | Admitting: Hematology

## 2021-10-23 DIAGNOSIS — C187 Malignant neoplasm of sigmoid colon: Secondary | ICD-10-CM

## 2021-10-23 NOTE — Telephone Encounter (Signed)
Scheduled appt per 5/30 referral . Pt is aware of appt date and time. Pt is aware to arrive 15 mins prior to appt time and to bring and updated insurance card. Pt is aware of appt location.   

## 2021-10-29 ENCOUNTER — Encounter: Payer: Self-pay | Admitting: Genetic Counselor

## 2021-10-29 ENCOUNTER — Telehealth: Payer: Self-pay | Admitting: Genetic Counselor

## 2021-10-29 DIAGNOSIS — Z1379 Encounter for other screening for genetic and chromosomal anomalies: Secondary | ICD-10-CM | POA: Insufficient documentation

## 2021-10-29 NOTE — Telephone Encounter (Signed)
I contacted Mr. Hamm to discuss his genetic testing results. No pathogenic variants were identified in the 77 genes analyzed. Detailed clinic note to follow.  The test report has been scanned into EPIC and is located under the Molecular Pathology section of the Results Review tab.  A portion of the result report is included below for reference.   Lucille Passy, MS, [facility] Genetic Counselor [email or web address] (P) [phone]

## 2021-10-30 LAB — SURGICAL PATHOLOGY

## 2021-10-31 ENCOUNTER — Other Ambulatory Visit: Payer: Self-pay

## 2021-10-31 NOTE — Progress Notes (Signed)
The proposed treatment discussed in conference is for discussion purpose only and is not a binding recommendation.  The patients have not been physically examined, or presented with their treatment options.  Therefore, final treatment plans cannot be decided.  

## 2021-11-01 ENCOUNTER — Encounter: Payer: Self-pay | Admitting: Genetic Counselor

## 2021-11-02 ENCOUNTER — Ambulatory Visit: Payer: Self-pay | Admitting: Genetic Counselor

## 2021-11-02 DIAGNOSIS — Z1379 Encounter for other screening for genetic and chromosomal anomalies: Secondary | ICD-10-CM

## 2021-11-02 NOTE — Progress Notes (Signed)
HPI:   Mr. Nunley was previously seen in the [place] clinic due to a personal and family history of cancer and concerns regarding a hereditary predisposition to cancer. Please refer to our prior cancer genetics clinic note for more information regarding our discussion, assessment and recommendations, at the time. Mr. Arviso's recent genetic test results were disclosed to him, as were recommendations warranted by these results. These results and recommendations are discussed in more detail below.  CANCER HISTORY:  Oncology History  Adenocarcinoma of sigmoid colon ([place])  10/02/2021 Initial Diagnosis   Adenocarcinoma of sigmoid colon ([place])    Genetic Testing   [facility] CancerNext-Expanded Panel was Negative. Report date is 10/26/2021.  The CancerNext-Expanded gene panel offered by [facility] and includes sequencing, rearrangement, and RNA analysis for the following 77 genes: AIP, ALK, APC, ATM, AXIN2, BAP1, BARD1, BLM, BMPR1A, BRCA1, BRCA2, BRIP1, CDC73, CDH1, CDK4, CDKN1B, CDKN2A, CHEK2, CTNNA1, DICER1, FANCC, FH, FLCN, GALNT12, KIF1B, LZTR1, MAX, MEN1, MET, MLH1, MSH2, MSH3, MSH6, MUTYH, NBN, NF1, NF2, NTHL1, PALB2, PHOX2B, PMS2, POT1, PRKAR1A, PTCH1, PTEN, RAD51C, RAD51D, RB1, RECQL, RET, SDHA, SDHAF2, SDHB, SDHC, SDHD, SMAD4, SMARCA4, SMARCB1, SMARCE1, STK11, SUFU, TMEM127, TP53, TSC1, TSC2, VHL and XRCC2 (sequencing and deletion/duplication); EGFR, EGLN1, HOXB13, KIT, MITF, PDGFRA, POLD1, and POLE (sequencing only); EPCAM and GREM1 (deletion/duplication only).      FAMILY HISTORY:  We obtained a detailed, 4-generation family history.  Significant diagnoses are listed below:      Family History  Problem Relation Age of Onset   Skin cancer Father          basal or squamous cell   Bladder Cancer Maternal Grandfather          dx. 20s   Colon cancer Paternal Grandmother          dx. 60s/70s       Mr. Rana's maternal grandfather was diagnosed with bladder cancer in his  60s, he died due to metastatic bladder cancer at age 63. His father has a history of skin cancer (basal or squamous cell). Mr. Waymire's paternal grandmother was diagnosed with colon cancer in her 72s/70s and died due to a blood clot. Mr. Caiazzo is unaware of previous family history of genetic testing for hereditary cancer risks. There is no reported Ashkenazi Jewish ancestry.   GENETIC TEST RESULTS:  The [facility] CancerNext-Expanded Panel found no pathogenic mutations.   The CancerNext-Expanded gene panel offered by [facility] and includes sequencing, rearrangement, and RNA analysis for the following 77 genes: AIP, ALK, APC, ATM, AXIN2, BAP1, BARD1, BLM, BMPR1A, BRCA1, BRCA2, BRIP1, CDC73, CDH1, CDK4, CDKN1B, CDKN2A, CHEK2, CTNNA1, DICER1, FANCC, FH, FLCN, GALNT12, KIF1B, LZTR1, MAX, MEN1, MET, MLH1, MSH2, MSH3, MSH6, MUTYH, NBN, NF1, NF2, NTHL1, PALB2, PHOX2B, PMS2, POT1, PRKAR1A, PTCH1, PTEN, RAD51C, RAD51D, RB1, RECQL, RET, SDHA, SDHAF2, SDHB, SDHC, SDHD, SMAD4, SMARCA4, SMARCB1, SMARCE1, STK11, SUFU, TMEM127, TP53, TSC1, TSC2, VHL and XRCC2 (sequencing and deletion/duplication); EGFR, EGLN1, HOXB13, KIT, MITF, PDGFRA, POLD1, and POLE (sequencing only); EPCAM and GREM1 (deletion/duplication only).    The test report has been scanned into EPIC and is located under the Molecular Pathology section of the Results Review tab.  A portion of the result report is included below for reference. Genetic testing reported out on 10/24/2021.        Even though a pathogenic variant was not identified, possible explanations for his personal history of cancer may include: There may be no hereditary risk for cancer in the family. The cancers in Mr.  Nonaka and/or his family may be due to other genetic or environmental factors. There may be a gene mutation in one of these genes that current testing methods cannot detect, but that chance is small. There could be another gene that has not yet been discovered, or  that we have not yet tested, that is responsible for the cancer diagnoses in the family.   Therefore, it is important to remain in touch with cancer genetics in the future so that we can continue to offer Mr. Feenstra the most up to date genetic testing.   ADDITIONAL GENETIC TESTING:  We discussed with Mr. Blasius that his genetic testing was fairly extensive.  If there are genes identified to increase cancer risk that can be analyzed in the future, we would be happy to discuss and coordinate this testing at that time.    CANCER SCREENING RECOMMENDATIONS:  Mr. Viar's test result is considered negative (normal).  This means that we have not identified a hereditary cause for his personal and family history of cancer at this time.   An individual's cancer risk and medical management are not determined by genetic test results alone. Overall cancer risk assessment incorporates additional factors, including personal medical history, family history, and any available genetic information that may result in a personalized plan for cancer prevention and surveillance. Therefore, it is recommended he continue to follow the cancer management and screening guidelines provided by his oncology and primary healthcare provider.  RECOMMENDATIONS FOR FAMILY MEMBERS:   Since he did not inherit a mutation in a cancer predisposition gene included on this panel, his children could not have inherited a mutation from him in one of these genes. His daughters are recommended to begin colonoscopies at age 32 (65 years prior to his age at diagnosis) and repeat every 5 years. More frequent colonoscopies may be recommended if polyps are identified.  FOLLOW-UP:  Cancer genetics is a rapidly advancing field and it is possible that new genetic tests will be appropriate for him and/or his family members in the future. We encouraged him to remain in contact with cancer genetics on an annual basis so we can update his personal and  family histories and let him know of advances in cancer genetics that may benefit this family.   Our contact number was provided. Mr. Phebus's questions were answered to his satisfaction, and he knows he is welcome to call us at anytime with additional questions or concerns.   Lucille Passy, MS, [facility] Genetic Counselor [email or web address] (P) [phone]

## 2021-11-12 ENCOUNTER — Inpatient Hospital Stay: Payer: BC Managed Care – PPO | Attending: Genetic Counselor | Admitting: Hematology

## 2021-11-12 ENCOUNTER — Telehealth: Payer: Self-pay | Admitting: Pharmacist

## 2021-11-12 ENCOUNTER — Other Ambulatory Visit (HOSPITAL_COMMUNITY): Payer: Self-pay

## 2021-11-12 ENCOUNTER — Inpatient Hospital Stay: Payer: BC Managed Care – PPO | Admitting: Hematology

## 2021-11-12 ENCOUNTER — Telehealth: Payer: Self-pay | Admitting: Pharmacy Technician

## 2021-11-12 ENCOUNTER — Encounter: Payer: Self-pay | Admitting: Hematology

## 2021-11-12 ENCOUNTER — Other Ambulatory Visit: Payer: Self-pay

## 2021-11-12 VITALS — BP 119/77 | HR 86 | Temp 98.7°F | Resp 20 | Wt 233.0 lb

## 2021-11-12 DIAGNOSIS — D630 Anemia in neoplastic disease: Secondary | ICD-10-CM | POA: Insufficient documentation

## 2021-11-12 DIAGNOSIS — Z8052 Family history of malignant neoplasm of bladder: Secondary | ICD-10-CM | POA: Insufficient documentation

## 2021-11-12 DIAGNOSIS — Z800 Family history of malignant neoplasm of digestive organs: Secondary | ICD-10-CM | POA: Insufficient documentation

## 2021-11-12 DIAGNOSIS — C187 Malignant neoplasm of sigmoid colon: Secondary | ICD-10-CM | POA: Insufficient documentation

## 2021-11-12 DIAGNOSIS — D500 Iron deficiency anemia secondary to blood loss (chronic): Secondary | ICD-10-CM

## 2021-11-12 DIAGNOSIS — Z5111 Encounter for antineoplastic chemotherapy: Secondary | ICD-10-CM | POA: Insufficient documentation

## 2021-11-12 MED ORDER — PROCHLORPERAZINE MALEATE 10 MG PO TABS
10.0000 mg | ORAL_TABLET | Freq: Four times a day (QID) | ORAL | 1 refills | Status: DC | PRN
  Filled 2021-11-12: qty 30, 8d supply, fill #0
  Filled 2021-12-21: qty 30, 8d supply, fill #1

## 2021-11-12 MED ORDER — ONDANSETRON HCL 8 MG PO TABS
8.0000 mg | ORAL_TABLET | Freq: Two times a day (BID) | ORAL | 1 refills | Status: AC | PRN
  Filled 2021-11-12: qty 30, 15d supply, fill #0
  Filled 2021-12-21: qty 30, 15d supply, fill #1

## 2021-11-12 MED ORDER — CAPECITABINE 500 MG PO TABS
850.0000 mg/m2 | ORAL_TABLET | Freq: Two times a day (BID) | ORAL | 1 refills | Status: DC
  Filled 2021-11-12: qty 112, 14d supply, fill #0

## 2021-11-12 MED ORDER — CAPECITABINE 500 MG PO TABS
850.0000 mg/m2 | ORAL_TABLET | Freq: Two times a day (BID) | ORAL | 1 refills | Status: DC
  Filled 2021-11-12: qty 112, 14d supply, fill #0
  Filled 2021-11-13: qty 112, 21d supply, fill #0
  Filled 2021-12-05: qty 112, 21d supply, fill #1

## 2021-11-12 NOTE — Progress Notes (Signed)
I met with Philip Clark and his wife, Philip Clark after  his consultation with Dr Feng.  I explained my role as a nurse navigator and provided my contact information. I explained the services provided at [facility] and provided written information.  I explained the alight grant and let  them know one of the financial advisors will reach out to  him at the time of his chemo education class. I briefly explained insertion and care of a port a cath.  I showed a sample of the port a cath.  I briefly reviewed common side effects associated with the recommended chemotherapy regimen,  CAPOX. I told them he will need chemo ed class prior to the start of chemotherapy.  All questions were answered. They verbalized understanding.  

## 2021-11-12 NOTE — Telephone Encounter (Signed)
Oral Oncology Patient Advocate Encounter   Received notification that prior authorization for Capecitabine (Xeloda) is required.   PA submitted on 11/12/2021 Key [id number] Status is pending     Philip Clark, CPhT-Adv Pharmacy Patient Advocate Specialist [place] Patient Advocate Team Direct Number: [phone]  Fax: [phone]

## 2021-11-12 NOTE — Progress Notes (Signed)
START ON PATHWAY REGIMEN - Colorectal     A cycle is every 21 days:     Capecitabine      Oxaliplatin   **Always confirm dose/schedule in your pharmacy ordering system**  Patient Characteristics: Postoperative without Neoadjuvant Therapy, M0 (Pathologic Staging), Colon, Stage III, Low Risk (pT1-3, pN1) Tumor Location: Colon Therapeutic Status: Postoperative without Neoadjuvant Therapy, M0 (Pathologic Staging) AJCC M Category: cM0 AJCC T Category: pT3 AJCC N Category: pN1a AJCC 8 Stage Grouping: IIIB Intent of Therapy: Curative Intent, Discussed with Patient 

## 2021-11-12 NOTE — Telephone Encounter (Signed)
Oral Oncology Pharmacist Encounter  Received new prescription for Xeloda (capecitabine) for the treatment of stage IIIB colon cancer in conjunction with oxaliplatin planned duration 3 months.  BMP and CBC from 10/19/21 assessed, noted Scr 1.20 mg/dL (CrCL > 60 mL/min). No other relevant lab abnormalities noted. Prescription dose and frequency assessed for appropriateness.   Current medication list in Epic reviewed, DDIs with Xeloda identified: DDI between Xeloda and Ondansetron due to risk of Qtc prolongation with fluorouracil products. Noted patient only prescribed to take PRN and PO route, risk higher with IV administration. No change in therapy warranted at this time.   Evaluated chart and no patient barriers to medication adherence noted.   Patient agreement for treatment documented in MD note on 11/12/21.  Prescription has been [facility]scribed to [facility] for benefits analysis and approval.  Oral Oncology Clinic will continue to follow for insurance authorization, copayment issues, initial counseling and start date.  Leron Croak, PharmD, BCPS Hematology/Oncology Clinical Pharmacist [facility] [place] [phone] 11/12/2021 3:45 PM

## 2021-11-12 NOTE — Progress Notes (Signed)
[place]   Telephone:([phone] Fax:([phone]) [place] Note   Patient Care Team: Libby Maw, MD as PCP - General (Family Medicine) Berniece Salines, DO as PCP - Cardiology (Cardiology) Truitt Merle, MD as Consulting Physician (Oncology) Royston Bake, RN as Oncology Nurse Navigator (Oncology)  Date of Service:  11/12/2021   CHIEF COMPLAINTS/PURPOSE OF CONSULTATION:  Newly Diagnosed Sigmoid Colon Cancer  REFERRING PHYSICIAN:  Dr. Dema Severin  ASSESSMENT & PLAN:  Philip Clark is a 36 y.o. male with  1. Adenocarcinoma of sigmoid colon, G1, stage IIIB p(T3, N1a) cM0, stage IIIB, MSS -initially developed abdominal pain/cramping in 02/2021 and rectal bleeding since 05/2021. Colonoscopy 09/11/21 showed 6 cm sigmoid colon mass located 20-26 cm from anal verge. Biopsy confirmed adenocarcinoma. -staging CT 4/25 and MRI 4/27 showed suspicious mesenteric lymph nodes, no distant metastasis. -s/p surgical resection 10/17/21 with Dr. Dema Severin, path showed: 6 cm invasive well differentiated adenocarcinoma with extension through submucosa, muscularis propria, and adipose tissue, and abutment but not definitive involvement of serosal membrane; margins negative; 1/22 positive lymph nodes. -I reviewed the pathology results and staging with patient and his wife today. I explained that he has stage IIIB well differentiated colon cancer.  No other high risk features such as lymphovascular invasion obstruction.  I discussed he has moderate risk for recurrence due to the stage IIIb disease.   -I discussed that adjuvant chemotherapy is recommended for stage III cancers. The recommended regimen is for CAPEOX, which consists of IV oxaliplatin and oral Xeloda.  I also discussed the option of FOLFOX.  Patient agrees with CapeOx. --Chemotherapy consent: Side effects including but does not not limited to, fatigue, nausea, vomiting, diarrhea, hair loss, neuropathy, fluid retention, renal  and kidney dysfunction, neutropenic fever, needed for blood transfusion, bleeding, were discussed with patient in great detail. He agrees to proceed. -We will of chemotherapy is curative. -Has recovered well from surgery, plan to start next week.  2. Mild Anemia, iron deficiency -secondary to #1 -last labs 07/2021 showed iron 62, ferritin 6.4. Hgb has been slowly decreasing since then, most recently 10.5 on 10/19/21. -plan to recheck his iron now that he is post surgery.   3. Genetics -His young age, genetic testing is recommended.  He is doing well showed MSI stable, unlikely Lynch syndrome. -She has met with genetic counselor, test results pending.  PLAN:  -lab and chemo class this week -I called in Xeloda -first oxaliplatin infusion 6/26, start Xeloda same day -lab and f/u week of 7/3   Oncology History Overview Note   Cancer Staging  Adenocarcinoma of sigmoid colon [facility]) Staging form: Colon and Rectum, AJCC 8th Edition - Pathologic stage from 10/17/2021: Stage IIIB (pT3, pN1a, cM0) - Signed by Truitt Merle, MD on 11/12/2021    Adenocarcinoma of sigmoid colon ([place])  09/11/2021 Procedure   Colonoscopy, Dr. Bryan Lemma  Impression - One 8 mm polyp in the ascending colon, removed with a cold snare. Resected and retrieved. - One 4 mm polyp in the sigmoid colon, removed with a cold snare. Resected and retrieved. - Malignant partially obstructing tumor in the sigmoid colon, located 20-26 cm from the anal verge. This was traversed. Biopsied. Tattoo placed 1-2 cm distal to the lesion. - One 2 mm polyp in the rectum, removed with a cold biopsy forceps. Resected and retrieved. - The distal rectum and anal verge are normal on retroflexion view. - The examined portion of the ileum was normal.   09/11/2021 Initial Biopsy  Diagnosis 1. Duodenum, Biopsy - DUODENAL MUCOSA WITH NORMAL VILLOUS ARCHITECTURE. - NO VILLOUS ATROPHY OR INCREASED INTRAEPITHELIAL LYMPHOCYTES. 2. Duodenum, Biopsy,  bulb - DUODENAL BULB WITH PEPTIC INJURY. 3. Ascending Colon Polyp - MULTIPLE FRAGMENTS OF TUBULAR ADENOMA WITHOUT HIGH GRADE DYSPLASIA. 4. Sigmoid Colon Polyp - INVASIVE COLONIC ADENOCARCINOMA. - SEE MICROSCOPIC DESCRIPTION. 5. Colon, polyp(s), mass - INVASIVE COLONIC ADENOCARCINOMA. - SEE MICROSCOPIC DESCRIPTION. 6. Rectum, polyp(s) - HYPERPLASTIC POLYP. Microscopic Comment 4. and 5. The carcinoma in parts 4 and 5 have identical morphologic features.   09/18/2021 Imaging   EXAM: CT CHEST, ABDOMEN, AND PELVIS WITH CONTRAST  IMPRESSION: 1. Short segment asymmetric wall thickening of the sigmoid colon is consistent with patient's known sigmoid colon cancer. 2. Prominent lymph nodes in the sigmoid mesentery measure up to 6 mm in short axis, which likely reflect local nodal disease involvement. 3. No evidence of distant metastatic disease in the chest, abdomen or pelvis. 4. No evidence of high-grade bowel obstruction. However, there is a large volume of formed stool throughout the colon   09/20/2021 Imaging   EXAM: MRI PELVIS WITHOUT AND WITH CONTRAST  IMPRESSION: 1. Partially obstructing malignancy of the mid sigmoid colon with prominent sigmoid mesenteric lymph nodes suspicious for early nodal metastases. 2. No evidence bowel obstruction, perforation, ascites or peritoneal nodularity. 3. Left paracentral disc protrusion at L5-S1 with resulting left S1 nerve root encroachment.   10/02/2021 Initial Diagnosis   Adenocarcinoma of sigmoid colon ([place])   10/09/2021 Genetic Testing   [facility] CancerNext-Expanded Panel was Negative. Report date is 10/26/2021.  The CancerNext-Expanded gene panel offered by [facility] and includes sequencing, rearrangement, and RNA analysis for the following 77 genes: AIP, ALK, APC, ATM, AXIN2, BAP1, BARD1, BLM, BMPR1A, BRCA1, BRCA2, BRIP1, CDC73, CDH1, CDK4, CDKN1B, CDKN2A, CHEK2, CTNNA1, DICER1, FANCC, FH, FLCN, GALNT12, KIF1B, LZTR1, MAX, MEN1, MET, MLH1,  MSH2, MSH3, MSH6, MUTYH, NBN, NF1, NF2, NTHL1, PALB2, PHOX2B, PMS2, POT1, PRKAR1A, PTCH1, PTEN, RAD51C, RAD51D, RB1, RECQL, RET, SDHA, SDHAF2, SDHB, SDHC, SDHD, SMAD4, SMARCA4, SMARCB1, SMARCE1, STK11, SUFU, TMEM127, TP53, TSC1, TSC2, VHL and XRCC2 (sequencing and deletion/duplication); EGFR, EGLN1, HOXB13, KIT, MITF, PDGFRA, POLD1, and POLE (sequencing only); EPCAM and GREM1 (deletion/duplication only).    10/17/2021 Definitive Surgery   FINAL MICROSCOPIC DIAGNOSIS:   A. COLON, RECTOSIGMOID, RESECTION:  -  Invasive well differentiated adenocarcinoma (6 cm in greatest  dimension) with extension through submucosa, muscularis propria and mesenteric/subserosal adipose tissue with abutment but not definitive involvement of the serosal membrane.  -  Focal evidence of precursor lesion (i.e. tubular adenoma)  -  Margins negative (proximal 5 cm, distal 7 cm and mesenteric 6 cm)  -  Negative for lymphovascular and perineural invasion.  -  Focal high-grade tumor budding present.  -  1 of 22 lymph nodes positive for malignancy  pT3 pN1 pM n/a   B. FINAL DISTAL MARGIN:  -   Unremarkable colonic mucosa, negative for malignancy.    ADDENDUM:  Mismatch Repair Protein (IHC)  SUMMARY INTERPRETATION: NORMAL   10/17/2021 Cancer Staging   Staging form: Colon and Rectum, AJCC 8th Edition - Pathologic stage from 10/17/2021: Stage IIIB (pT3, pN1a, cM0) - Signed by Truitt Merle, MD on 11/12/2021 Total positive nodes: 1 Histologic grading system: 4 grade system Histologic grade (G): G2 Residual tumor (R): R0 - None   11/19/2021 -  Chemotherapy   Patient is on Treatment Plan : COLORECTAL Xelox (Capeox) q21d        HISTORY OF PRESENTING ILLNESS:  Graeden Siefker 36 y.o.  male is a here because of colon cancer. The patient was referred by Dr. Dema Severin. The patient presents to the clinic today accompanied by his wife.   He had been followed by Dr. Marcello Moores for a thrombosed hemorrhoid. He initially developed  intermittent abdominal pain/cramping in 02/2021 and rectal bleeding in 05/2021. He was found to have low hemoglobin at physical in 05/2021. He was seen by his PCP in 06/2021, referred to GI in 07/2021, and underwent colonoscopy on 09/11/21. Colonoscopy showed a 6 cm mass in the sigmoid colon, and pathology confirmed adenocarcinoma. Staging CT CAP on 4/25 and pelvis MRI on 09/20/21 showed suspicious prominent sigmoid mesentery lymph nodes.  He proceeded to resection on 10/17/21 under Dr. Dema Severin. Pathology showed: 6 cm invasive well differentiated adenocarcinoma with extension through submucosa, muscularis propria, and mesenteric/subserosal adipose tissue and abutment but not definite involvement of serosal membrane; focal evidence of precursor lesion; margins negative; focal high-grade tumor budding present; 1/22 positive lymph node.   Today the patient notes they felt/feeling prior/after... -no prior weight loss until after surgery (~7 lbs since surgery), notes appetite is good -improving fatigue since surgery. -bowel movements regular  He has a PMHx of.... -?depression/anxiety, possible relation to youngest daughter's health   Socially... 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 They have a special-needs daughter (13 yo) with seizures, developmental delays, and more recently will get hemi-plegic migraines that can land her in the hospital. Their older daughter (8 yo) is healthy. -he reports colon cancer in his paternal grandmother, metastatic bladder cancer in his maternal grandfather -never smoker, occasional alcohol use   REVIEW OF SYSTEMS:    Constitutional: Denies fevers, chills or abnormal night sweats Eyes: Denies blurriness of vision, double vision or watery eyes Ears, nose, mouth, throat, and face: Denies mucositis or sore throat Respiratory: Denies cough, dyspnea or  wheezes Cardiovascular: Denies palpitation, chest discomfort or lower extremity swelling Gastrointestinal:  Denies nausea, heartburn or change in bowel habits Skin: Denies abnormal skin rashes Lymphatics: Denies new lymphadenopathy or easy bruising Neurological:Denies numbness, tingling or new weaknesses Behavioral/Psych: Mood is stable, no new changes  All other systems were reviewed with the patient and are negative.   MEDICAL HISTORY:  Past Medical History:  Diagnosis Date   Allergy    Anemia    Anxiety    Bronchitis    Cancer ([place])    Depression     SURGICAL HISTORY: Past Surgical History:  Procedure Laterality Date   ANTERIOR CRUCIATE LIGAMENT (ACL) REVISION Left    FLEXIBLE SIGMOIDOSCOPY N/A 10/17/2021   Procedure: FLEXIBLE SIGMOIDOSCOPY, INTRAOPERATIVE ASSESSMENT OF PERFUSION;  Surgeon: Ileana Roup, MD;  Location: [facility];  Service: General;  Laterality: N/A;   XI ROBOTIC ASSISTED LOWER ANTERIOR RESECTION N/A 10/17/2021   Procedure: XI ROBOTIC ASSISTED LOWER ANTERIOR RESECTION;  Surgeon: Ileana Roup, MD;  Location: [facility];  Service: General;  Laterality: N/A;  GEN AND TAP BLOCK    SOCIAL HISTORY: Social History   Socioeconomic History   Marital status: Married    Spouse name: Not on file   Number of children: 2   Years of education: Not on file   Highest education level: Not on file  Occupational History   Occupation: IT trainer  Tobacco Use   Smoking status: Never   Smokeless tobacco: Never  Vaping Use   Vaping Use: Never used  Substance and Sexual Activity  Alcohol use: Yes    Alcohol/week: 1.0 standard drink of alcohol    Types: 1 Cans of beer per week    Comment: occ   Drug use: No   Sexual activity: Yes  Other Topics Concern   Not on file  Social History Narrative   Not on file   Social Determinants of Health   Financial Resource Strain: Not on file  Food Insecurity: Not on file  Transportation Needs: Not on file  Physical  Activity: Not on file  Stress: Not on file  Social Connections: Not on file  Intimate Partner Violence: Not on file    FAMILY HISTORY: Family History  Problem Relation Age of Onset   Anxiety disorder Mother    Heart disease Father 3       MI x 2   Skin cancer Father        basal or squamous cell   Dementia Maternal Grandmother    Heart disease Maternal Grandfather 50       CAD with CABG   Bladder Cancer Maternal Grandfather        dx. 73s   Colon cancer Paternal Grandmother        dx. 60s/70s   Early death Paternal Grandfather 33       CAD/MI   Rectal cancer Neg Hx    Stomach cancer Neg Hx    Esophageal cancer Neg Hx     ALLERGIES:  has No Known Allergies.  MEDICATIONS:  Current Outpatient Medications  Medication Sig Dispense Refill   Acetaminophen (TYLENOL PO) Take 1,250 mg by mouth every 8 (eight) hours as needed.     Ascorbic Acid (VITAMIN C) 100 MG tablet Take 100 mg by mouth daily.     cholecalciferol (VITAMIN D3) 25 MCG (1000 UNIT) tablet Take 1,000 Units by mouth daily.     ferrous sulfate 325 (65 FE) MG tablet Take 325 mg by mouth daily with breakfast.     ibuprofen (ADVIL) 800 MG tablet Take 800 mg by mouth every 8 (eight) hours as needed.     Multiple Vitamin (MULTIVITAMIN WITH MINERALS) TABS tablet Take 1 tablet by mouth daily.     Omega-3 Fatty Acids (FISH OIL PO) Take 1 capsule by mouth daily.     zinc gluconate 50 MG tablet Take 50 mg by mouth daily.     capecitabine (XELODA) 500 MG tablet Take 4 tablets (2,000 mg total) by mouth 2 (two) times daily after a meal. Take for 14 days on, 7 days off. Repeat every 21 days. 112 tablet 1   ondansetron (ZOFRAN) 8 MG tablet Take 1 tablet (8 mg total) by mouth 2 (two) times daily as needed for refractory nausea / vomiting. Start on day 3 after chemotherapy. 30 tablet 1   prochlorperazine (COMPAZINE) 10 MG tablet Take 1 tablet (10 mg total) by mouth every 6 (six) hours as needed (Nausea or vomiting). 30 tablet 1   No  current facility-administered medications for this visit.    PHYSICAL EXAMINATION: ECOG PERFORMANCE STATUS: 0 - Asymptomatic  Vitals:   11/12/21 1456  BP: 119/77  Pulse: 86  Resp: 20  Temp: 98.7 F (37.1 C)  SpO2: 100%   Filed Weights   11/12/21 1456  Weight: 233 lb (105.7 kg)    GENERAL:alert, no distress and comfortable SKIN: skin color, texture, turgor are normal, no rashes or significant lesions EYES: normal, Conjunctiva are pink and non-injected, sclera clear  LUNGS: clear to auscultation and percussion with normal breathing  effort HEART: regular rate & rhythm and no murmurs and no lower extremity edema ABDOMEN:abdomen soft, non-tender and normal bowel sounds, incisions healing well Musculoskeletal:no cyanosis of digits and no clubbing  NEURO: alert & oriented x 3 with fluent speech, no focal motor/sensory deficits  LABORATORY DATA:  I have reviewed the data as listed    Latest Ref Rng & Units 10/19/2021    4:55 AM 10/18/2021    6:52 AM 10/08/2021   10:34 AM  CBC  WBC 4.0 - 10.5 K/uL 9.0  13.6  8.2   Hemoglobin 13.0 - 17.0 g/dL 10.5  11.1  11.3   Hematocrit 39.0 - 52.0 % 33.0  34.9  35.8   Platelets 150 - 400 K/uL 278  333  326        Latest Ref Rng & Units 10/19/2021    4:55 AM 10/18/2021    6:52 AM 10/08/2021   10:34 AM  CMP  Glucose 70 - 99 mg/dL 88  105  96   BUN 6 - 20 mg/dL _0 Creatinine 0.61 - 1.24 mg/dL 1.20  1.14  1.06   Sodium 135 - 145 mmol/L 141  140  139   Potassium 3.5 - 5.1 mmol/L 3.9  3.6  4.6   Chloride 98 - 111 mmol/L 110  108  108   CO2 22 - 32 mmol/L _1 Calcium 8.9 - 10.3 mg/dL 8.7  8.8  9.2   Total Protein 6.5 - 8.1 g/dL   7.7   Total Bilirubin 0.3 - 1.2 mg/dL   0.8   Alkaline Phos 38 - 126 U/L   56   AST 15 - 41 U/L   19   ALT 0 - 44 U/L   17      RADIOGRAPHIC STUDIES: I have personally reviewed the radiological images as listed and agreed with the findings in the report. No results found.   Orders Placed  This Encounter  Procedures   CBC with Differential/Platelet    Clark Status:   Clark    Number of Occurrences:   50    Clark Expiration Date:   11/13/2022   CEA (IN HOUSE-[facility])    Clark Status:   Clark    Number of Occurrences:   5    Clark Expiration Date:   11/13/2022   Comprehensive metabolic panel    Clark Status:   Clark    Number of Occurrences:   50    Clark Expiration Date:   11/13/2022   Ferritin    Clark Status:   Clark    Number of Occurrences:   20    Clark Expiration Date:   11/13/2022   Iron and TIBC    Clark Status:   Future    Clark Expiration Date:   11/12/2022    All questions were answered. The patient knows to call the clinic with any problems, questions or concerns. The total time spent in the appointment was 60 minutes.     Truitt Merle, MD 11/12/2021   I, Wilburn Mylar, am acting as scribe for Truitt Merle, MD.   I have reviewed the above documentation for accuracy and completeness, and I agree with the above.

## 2021-11-13 ENCOUNTER — Other Ambulatory Visit (HOSPITAL_COMMUNITY): Payer: Self-pay

## 2021-11-13 ENCOUNTER — Inpatient Hospital Stay: Payer: BC Managed Care – PPO

## 2021-11-13 DIAGNOSIS — Z8052 Family history of malignant neoplasm of bladder: Secondary | ICD-10-CM | POA: Diagnosis not present

## 2021-11-13 DIAGNOSIS — D500 Iron deficiency anemia secondary to blood loss (chronic): Secondary | ICD-10-CM

## 2021-11-13 DIAGNOSIS — D630 Anemia in neoplastic disease: Secondary | ICD-10-CM | POA: Diagnosis not present

## 2021-11-13 DIAGNOSIS — C187 Malignant neoplasm of sigmoid colon: Secondary | ICD-10-CM | POA: Diagnosis not present

## 2021-11-13 DIAGNOSIS — Z800 Family history of malignant neoplasm of digestive organs: Secondary | ICD-10-CM | POA: Diagnosis not present

## 2021-11-13 DIAGNOSIS — Z5111 Encounter for antineoplastic chemotherapy: Secondary | ICD-10-CM | POA: Diagnosis not present

## 2021-11-13 LAB — CBC WITH DIFFERENTIAL/PLATELET
Abs Immature Granulocytes: 0.02 10*3/uL (ref 0.00–0.07)
Basophils Absolute: 0.1 10*3/uL (ref 0.0–0.1)
Basophils Relative: 1 %
Eosinophils Absolute: 1.1 10*3/uL — ABNORMAL HIGH (ref 0.0–0.5)
Eosinophils Relative: 14 %
HCT: 35.5 % — ABNORMAL LOW (ref 39.0–52.0)
Hemoglobin: 11.4 g/dL — ABNORMAL LOW (ref 13.0–17.0)
Immature Granulocytes: 0 %
Lymphocytes Relative: 27 %
Lymphs Abs: 2 10*3/uL (ref 0.7–4.0)
MCH: 26.3 pg (ref 26.0–34.0)
MCHC: 32.1 g/dL (ref 30.0–36.0)
MCV: 81.8 fL (ref 80.0–100.0)
Monocytes Absolute: 0.8 10*3/uL (ref 0.1–1.0)
Monocytes Relative: 11 %
Neutro Abs: 3.6 10*3/uL (ref 1.7–7.7)
Neutrophils Relative %: 47 %
Platelets: 355 10*3/uL (ref 150–400)
RBC: 4.34 MIL/uL (ref 4.22–5.81)
RDW: 14.1 % (ref 11.5–15.5)
WBC: 7.6 10*3/uL (ref 4.0–10.5)
nRBC: 0 % (ref 0.0–0.2)

## 2021-11-13 LAB — COMPREHENSIVE METABOLIC PANEL
ALT: 28 U/L (ref 0–44)
AST: 20 U/L (ref 15–41)
Albumin: 4.3 g/dL (ref 3.5–5.0)
Alkaline Phosphatase: 54 U/L (ref 38–126)
Anion gap: 5 (ref 5–15)
BUN: 16 mg/dL (ref 6–20)
CO2: 28 mmol/L (ref 22–32)
Calcium: 9.4 mg/dL (ref 8.9–10.3)
Chloride: 106 mmol/L (ref 98–111)
Creatinine, Ser: 1.12 mg/dL (ref 0.61–1.24)
GFR, Estimated: >60 mL/min (ref >60)
Glucose, Bld: 130 mg/dL — ABNORMAL HIGH (ref 70–99)
Potassium: 4 mmol/L (ref 3.5–5.1)
Sodium: 139 mmol/L (ref 135–145)
Total Bilirubin: 0.7 mg/dL (ref 0.3–1.2)
Total Protein: 7.7 g/dL (ref 6.5–8.1)

## 2021-11-13 NOTE — Telephone Encounter (Signed)
Oral Chemotherapy Pharmacist Encounter  I spoke with patient for overview of: Xeloda (capecitabine) for the treatment of stage IIIB colon cancer in conjunction with oxaliplatin planned duration ~3 months.  Counseled patient on administration, dosing, side effects, monitoring, drug-food interactions, safe handling, storage, and disposal.  Patient will take Xeloda '500mg'$  tablets, 4 tablets ('2000mg'$ ) by mouth in AM and 4 tabs ('2000mg'$ ) by mouth in PM, within 30 minutes of finishing meals, on days 1-14 of each 21 day cycle.   Oxaliplatin will be infused on day 1 of each 21 day cycle.  Xeloda and oxaliplatin start date: 11/19/21  Adverse effects include but are not limited to: fatigue, decreased blood counts, GI upset, diarrhea, mouth sores, and hand-foot syndrome.  Patient has anti-emetic on hand and knows to take it if nausea develops.   Patient will obtain anti diarrheal and alert the office of 4 or more loose stools above baseline.  Reviewed with patient importance of keeping a medication schedule and plan for any missed doses. No barriers to medication adherence identified.  Medication reconciliation performed and medication/allergy list updated.  Insurance authorization for Xeloda has been obtained. Test claim at the pharmacy revealed copayment $0 for 1st fill of Xeloda. Patient will pick this up from the [facility] on 11/13/21.  Patient informed the pharmacy will reach out 5-7 days prior to needing next fill of Xeloda to coordinate continued medication acquisition to prevent break in therapy.  All questions answered.  Mr. Sawyer voiced understanding and appreciation.   Medication education handout and calendar will be given to patient in clinic on 11/13/21. Patient knows to call the office with questions or concerns. Oral Chemotherapy Clinic phone number provided to patient.   Leron Croak, PharmD, BCPS Hematology/Oncology Clinical Pharmacist [facility] [phone] 11/13/2021 10:07 AM

## 2021-11-13 NOTE — Telephone Encounter (Signed)
Oral Oncology Patient Advocate Encounter  Prior Authorization for Capecitabine (Xeloda) has been approved.    PA# [id number] Effective dates: 11/12/2021 through 11/11/2022  Patients co-pay is $0.    Philip Clark, CPhT-Adv Pharmacy Patient [place] Patient Advocate Team Direct Number: [phone]  Fax: [phone]

## 2021-11-14 LAB — CEA (IN HOUSE-CHCC): CEA (CHCC-In House): 2.12 ng/mL (ref 0.00–5.00)

## 2021-11-14 LAB — FERRITIN: Ferritin: 8 ng/mL — ABNORMAL LOW (ref 24–336)

## 2021-11-15 ENCOUNTER — Encounter: Payer: Self-pay | Admitting: Licensed Clinical Social Worker

## 2021-11-15 DIAGNOSIS — C187 Malignant neoplasm of sigmoid colon: Secondary | ICD-10-CM

## 2021-11-16 ENCOUNTER — Telehealth: Payer: Self-pay

## 2021-11-16 MED FILL — Dexamethasone Sodium Phosphate Inj 100 MG/10ML: INTRAMUSCULAR | Qty: 1 | Status: AC

## 2021-11-19 ENCOUNTER — Other Ambulatory Visit: Payer: Self-pay

## 2021-11-19 ENCOUNTER — Inpatient Hospital Stay: Payer: BC Managed Care – PPO

## 2021-11-19 VITALS — BP 122/72 | HR 88 | Temp 98.2°F | Resp 18

## 2021-11-19 DIAGNOSIS — D630 Anemia in neoplastic disease: Secondary | ICD-10-CM | POA: Diagnosis not present

## 2021-11-19 DIAGNOSIS — Z5111 Encounter for antineoplastic chemotherapy: Secondary | ICD-10-CM | POA: Diagnosis not present

## 2021-11-19 DIAGNOSIS — C187 Malignant neoplasm of sigmoid colon: Secondary | ICD-10-CM

## 2021-11-19 DIAGNOSIS — Z800 Family history of malignant neoplasm of digestive organs: Secondary | ICD-10-CM | POA: Diagnosis not present

## 2021-11-19 DIAGNOSIS — Z8052 Family history of malignant neoplasm of bladder: Secondary | ICD-10-CM | POA: Diagnosis not present

## 2021-11-19 MED ORDER — DEXTROSE 5 % IV SOLN: Freq: Once | INTRAVENOUS | Status: AC

## 2021-11-19 MED ORDER — SODIUM CHLORIDE 0.9 % IV SOLN
10.0000 mg | Freq: Once | INTRAVENOUS | Status: AC
  Administered 2021-11-19: 10 mg via INTRAVENOUS
  Filled 2021-11-19: qty 10

## 2021-11-19 MED ORDER — PALONOSETRON HCL INJECTION 0.25 MG/5ML
0.2500 mg | Freq: Once | INTRAVENOUS | Status: AC
  Administered 2021-11-19: 0.25 mg via INTRAVENOUS
  Filled 2021-11-19: qty 5

## 2021-11-19 MED ORDER — OXALIPLATIN CHEMO INJECTION 100 MG/20ML
128.0000 mg/m2 | Freq: Once | INTRAVENOUS | Status: AC
  Administered 2021-11-19: 300 mg via INTRAVENOUS
  Filled 2021-11-19: qty 60

## 2021-11-19 NOTE — Progress Notes (Signed)
Per Dr. Mosetta Putt, ok to proceed with treatment with labs from 11/13/2021.

## 2021-11-20 ENCOUNTER — Telehealth: Payer: Self-pay | Admitting: *Deleted

## 2021-11-20 ENCOUNTER — Encounter: Payer: Self-pay | Admitting: *Deleted

## 2021-11-22 ENCOUNTER — Other Ambulatory Visit (HOSPITAL_COMMUNITY): Payer: Self-pay

## 2021-11-22 NOTE — Telephone Encounter (Addendum)
Today, this nurse received paperwork signed by provider.  Both successfully returned via fax.  Original copies prepared for [employer]PS mail to address on file per patient request. [address] [place] [facility] [id number] To complete process sending request to (SW) H.I.M., this nurse awaiting return call regarding e-mails previously sent to patient regarding [employer] form reads "Records may be needed if claim is within first two years of policy".   12/18/2021 Unable to complete process as described above.  Multiple communications with Earnestine Haeberle incomplete.  Today, records to bin for items to be scanned.  No record request to (SW) H.I.M.  Awaiting [facility] and unaware of date policy commenced.    Patient may obtain records if needed through [place].

## 2021-11-29 ENCOUNTER — Other Ambulatory Visit: Payer: Self-pay

## 2021-11-29 DIAGNOSIS — C187 Malignant neoplasm of sigmoid colon: Secondary | ICD-10-CM

## 2021-11-29 DIAGNOSIS — D500 Iron deficiency anemia secondary to blood loss (chronic): Secondary | ICD-10-CM

## 2021-11-30 ENCOUNTER — Encounter: Payer: Self-pay | Admitting: Hematology

## 2021-11-30 ENCOUNTER — Other Ambulatory Visit: Payer: Self-pay

## 2021-11-30 ENCOUNTER — Inpatient Hospital Stay: Payer: BC Managed Care – PPO | Admitting: Hematology

## 2021-11-30 ENCOUNTER — Inpatient Hospital Stay: Payer: BC Managed Care – PPO | Attending: Genetic Counselor

## 2021-11-30 VITALS — BP 119/76 | HR 80 | Temp 98.3°F | Resp 18 | Ht 74.0 in | Wt 244.2 lb

## 2021-11-30 DIAGNOSIS — D630 Anemia in neoplastic disease: Secondary | ICD-10-CM | POA: Diagnosis not present

## 2021-11-30 DIAGNOSIS — Z5111 Encounter for antineoplastic chemotherapy: Secondary | ICD-10-CM | POA: Diagnosis not present

## 2021-11-30 DIAGNOSIS — Z8052 Family history of malignant neoplasm of bladder: Secondary | ICD-10-CM | POA: Insufficient documentation

## 2021-11-30 DIAGNOSIS — C187 Malignant neoplasm of sigmoid colon: Secondary | ICD-10-CM

## 2021-11-30 DIAGNOSIS — Z800 Family history of malignant neoplasm of digestive organs: Secondary | ICD-10-CM | POA: Insufficient documentation

## 2021-11-30 DIAGNOSIS — D500 Iron deficiency anemia secondary to blood loss (chronic): Secondary | ICD-10-CM

## 2021-11-30 LAB — COMPREHENSIVE METABOLIC PANEL
ALT: 21 U/L (ref 0–44)
AST: 20 U/L (ref 15–41)
Albumin: 4.2 g/dL (ref 3.5–5.0)
Alkaline Phosphatase: 57 U/L (ref 38–126)
Anion gap: 5 (ref 5–15)
BUN: 15 mg/dL (ref 6–20)
CO2: 26 mmol/L (ref 22–32)
Calcium: 9.2 mg/dL (ref 8.9–10.3)
Chloride: 107 mmol/L (ref 98–111)
Creatinine, Ser: 1.16 mg/dL (ref 0.61–1.24)
GFR, Estimated: >60 mL/min (ref >60)
Glucose, Bld: 105 mg/dL — ABNORMAL HIGH (ref 70–99)
Potassium: 3.8 mmol/L (ref 3.5–5.1)
Sodium: 138 mmol/L (ref 135–145)
Total Bilirubin: 0.8 mg/dL (ref 0.3–1.2)
Total Protein: 7.6 g/dL (ref 6.5–8.1)

## 2021-11-30 LAB — IRON AND IRON BINDING CAPACITY (CC-WL,HP ONLY)
Iron: 65 ug/dL (ref 45–182)
Saturation Ratios: 17 % — ABNORMAL LOW (ref 17.9–39.5)
TIBC: 393 ug/dL (ref 250–450)
UIBC: 328 ug/dL (ref 117–376)

## 2021-11-30 LAB — CBC WITH DIFFERENTIAL/PLATELET
Abs Immature Granulocytes: 0.02 10*3/uL (ref 0.00–0.07)
Basophils Absolute: 0.1 10*3/uL (ref 0.0–0.1)
Basophils Relative: 1 %
Eosinophils Absolute: 0.5 10*3/uL (ref 0.0–0.5)
Eosinophils Relative: 8 %
HCT: 35.1 % — ABNORMAL LOW (ref 39.0–52.0)
Hemoglobin: 11.7 g/dL — ABNORMAL LOW (ref 13.0–17.0)
Immature Granulocytes: 0 %
Lymphocytes Relative: 31 %
Lymphs Abs: 2.2 10*3/uL (ref 0.7–4.0)
MCH: 26.9 pg (ref 26.0–34.0)
MCHC: 33.3 g/dL (ref 30.0–36.0)
MCV: 80.7 fL (ref 80.0–100.0)
Monocytes Absolute: 0.9 10*3/uL (ref 0.1–1.0)
Monocytes Relative: 12 %
Neutro Abs: 3.5 10*3/uL (ref 1.7–7.7)
Neutrophils Relative %: 48 %
Platelets: 236 10*3/uL (ref 150–400)
RBC: 4.35 MIL/uL (ref 4.22–5.81)
RDW: 13.4 % (ref 11.5–15.5)
WBC: 7.2 10*3/uL (ref 4.0–10.5)
nRBC: 0 % (ref 0.0–0.2)

## 2021-11-30 NOTE — Progress Notes (Signed)
Philip Clark   [phone] Fax:([phone]   Clinic Follow up Note   Patient Care Team: Libby Maw, MD as PCP - General (Family Medicine) Berniece Salines, DO as PCP - Cardiology (Cardiology) Truitt Merle, MD as Consulting Physician (Oncology) Royston Bake, RN as Oncology Nurse Navigator (Oncology)  Date of Service:  11/30/2021  CHIEF COMPLAINT: f/u of sigmoid colon cancer  CURRENT THERAPY:  Adjuvant CAPEOX, q21d, starting 11/19/21  ASSESSMENT & PLAN:  Philip Clark is a 36 y.o. male with   1. Adenocarcinoma of sigmoid colon, G1, stage IIIB p(T3, N1a) cM0, stage IIIB, MSS -initially developed abdominal pain/cramping in 02/2021 and rectal bleeding since 05/2021. Colonoscopy 09/11/21 showed 6 cm sigmoid colon mass located 20-26 cm from anal verge. Biopsy confirmed adenocarcinoma. -staging CT 4/25 and MRI 4/27 showed suspicious mesenteric lymph nodes, no distant metastasis. -s/p surgical resection 10/17/21 with Dr. Dema Severin, path showed clear margins and one positive node -given his stage IIIB disease, he started adjuvant CAPEOX on 11/19/21. He tolerated cycle one well with mild cold sensitivity, low appetite. He recovered well this week. -labs reviewed, mild anemia with hgb 11.7, otherwise WNL. -will continue chemo, plan for a total of 4 cycles    2. Mild Anemia, iron deficiency -secondary to #1 -last labs 07/2021 showed iron 62, ferritin 6.4. Hgb has been slowly decreasing since then, most recently 10.5 on 10/19/21. -iron today is stable at 65, hgb 11.7 (11/30/21)   3. Genetics -genetic testing performed 10/09/21, results were negative. -he has a younger brother. I discussed that the recommendation is for his brother to proceed with colonoscopy now. He expressed understanding.    PLAN:  -continue cycle 1 Xeloda through Sunday, 7/9 -lab, f/u, and cycle 2 CAPEOX on 7/17    No problem-specific Assessment & Plan notes found for this  encounter.   SUMMARY OF ONCOLOGIC HISTORY: Oncology History Overview Note   Cancer Staging  Adenocarcinoma of sigmoid colon [facility]) Staging form: Colon and Rectum, AJCC 8th Edition - Pathologic stage from 10/17/2021: Stage IIIB (pT3, pN1a, cM0) - Signed by Truitt Merle, MD on 11/12/2021    Adenocarcinoma of sigmoid colon ([place])  09/11/2021 Procedure   Colonoscopy, Dr. Bryan Lemma  Impression - One 8 mm polyp in the ascending colon, removed with a cold snare. Resected and retrieved. - One 4 mm polyp in the sigmoid colon, removed with a cold snare. Resected and retrieved. - Malignant partially obstructing tumor in the sigmoid colon, located 20-26 cm from the anal verge. This was traversed. Biopsied. Tattoo placed 1-2 cm distal to the lesion. - One 2 mm polyp in the rectum, removed with a cold biopsy forceps. Resected and retrieved. - The distal rectum and anal verge are normal on retroflexion view. - The examined portion of the ileum was normal.   09/11/2021 Initial Biopsy   Diagnosis 1. Duodenum, Biopsy - DUODENAL MUCOSA WITH NORMAL VILLOUS ARCHITECTURE. - NO VILLOUS ATROPHY OR INCREASED INTRAEPITHELIAL LYMPHOCYTES. 2. Duodenum, Biopsy, bulb - DUODENAL BULB WITH PEPTIC INJURY. 3. Ascending Colon Polyp - MULTIPLE FRAGMENTS OF TUBULAR ADENOMA WITHOUT HIGH GRADE DYSPLASIA. 4. Sigmoid Colon Polyp - INVASIVE COLONIC ADENOCARCINOMA. - SEE MICROSCOPIC DESCRIPTION. 5. Colon, polyp(s), mass - INVASIVE COLONIC ADENOCARCINOMA. - SEE MICROSCOPIC DESCRIPTION. 6. Rectum, polyp(s) - HYPERPLASTIC POLYP. Microscopic Comment 4. and 5. The carcinoma in parts 4 and 5 have identical morphologic features.   09/18/2021 Imaging   EXAM: CT CHEST, ABDOMEN, AND PELVIS WITH CONTRAST  IMPRESSION: 1. Short segment asymmetric wall thickening of  the sigmoid colon is consistent with patient's known sigmoid colon cancer. 2. Prominent lymph nodes in the sigmoid mesentery measure up to 6 mm in short axis, which  likely reflect local nodal disease involvement. 3. No evidence of distant metastatic disease in the chest, abdomen or pelvis. 4. No evidence of high-grade bowel obstruction. However, there is a large volume of formed stool throughout the colon   09/20/2021 Imaging   EXAM: MRI PELVIS WITHOUT AND WITH CONTRAST  IMPRESSION: 1. Partially obstructing malignancy of the mid sigmoid colon with prominent sigmoid mesenteric lymph nodes suspicious for early nodal metastases. 2. No evidence bowel obstruction, perforation, ascites or peritoneal nodularity. 3. Left paracentral disc protrusion at L5-S1 with resulting left S1 nerve root encroachment.   10/02/2021 Initial Diagnosis   Adenocarcinoma of sigmoid colon ([place])   10/09/2021 Genetic Testing   [facility] CancerNext-Expanded Panel was Negative. Report date is 10/26/2021.  The CancerNext-Expanded gene panel offered by [facility] and includes sequencing, rearrangement, and RNA analysis for the following 77 genes: AIP, ALK, APC, ATM, AXIN2, BAP1, BARD1, BLM, BMPR1A, BRCA1, BRCA2, BRIP1, CDC73, CDH1, CDK4, CDKN1B, CDKN2A, CHEK2, CTNNA1, DICER1, FANCC, FH, FLCN, GALNT12, KIF1B, LZTR1, MAX, MEN1, MET, MLH1, MSH2, MSH3, MSH6, MUTYH, NBN, NF1, NF2, NTHL1, PALB2, PHOX2B, PMS2, POT1, PRKAR1A, PTCH1, PTEN, RAD51C, RAD51D, RB1, RECQL, RET, SDHA, SDHAF2, SDHB, SDHC, SDHD, SMAD4, SMARCA4, SMARCB1, SMARCE1, STK11, SUFU, TMEM127, TP53, TSC1, TSC2, VHL and XRCC2 (sequencing and deletion/duplication); EGFR, EGLN1, HOXB13, KIT, MITF, PDGFRA, POLD1, and POLE (sequencing only); EPCAM and GREM1 (deletion/duplication only).    10/17/2021 Definitive Surgery   FINAL MICROSCOPIC DIAGNOSIS:   A. COLON, RECTOSIGMOID, RESECTION:  -  Invasive well differentiated adenocarcinoma (6 cm in greatest  dimension) with extension through submucosa, muscularis propria and mesenteric/subserosal adipose tissue with abutment but not definitive involvement of the serosal membrane.  -  Focal  evidence of precursor lesion (i.e. tubular adenoma)  -  Margins negative (proximal 5 cm, distal 7 cm and mesenteric 6 cm)  -  Negative for lymphovascular and perineural invasion.  -  Focal high-grade tumor budding present.  -  1 of 22 lymph nodes positive for malignancy  pT3 pN1 pM n/a   B. FINAL DISTAL MARGIN:  -   Unremarkable colonic mucosa, negative for malignancy.    ADDENDUM:  Mismatch Repair Protein (IHC)  SUMMARY INTERPRETATION: NORMAL   10/17/2021 Cancer Staging   Staging form: Colon and Rectum, AJCC 8th Edition - Pathologic stage from 10/17/2021: Stage IIIB (pT3, pN1a, cM0) - Signed by Truitt Merle, MD on 11/12/2021 Total positive nodes: 1 Histologic grading system: 4 grade system Histologic grade (G): G2 Residual tumor (R): R0 - None   11/19/2021 -  Chemotherapy   Patient is on Treatment Plan : COLORECTAL Xelox (Capeox) q21d        INTERVAL HISTORY:  Philip Clark is here for a follow up of colon cancer. He was last seen by me on 11/12/21 in consultation. He presents to the clinic alone. He reports he is doing well today. He reports the first week was "rough," but he has recovered well this week. He notes he had mild cold sensitivity and low appetite.   All other systems were reviewed with the patient and are negative.  MEDICAL HISTORY:  Past Medical History:  Diagnosis Date   Allergy    Anemia    Anxiety    Bronchitis    Cancer ([place])    Depression     SURGICAL HISTORY: Past Surgical History:  Procedure Laterality Date  ANTERIOR CRUCIATE LIGAMENT (ACL) REVISION Left    FLEXIBLE SIGMOIDOSCOPY N/A 10/17/2021   Procedure: FLEXIBLE SIGMOIDOSCOPY, INTRAOPERATIVE ASSESSMENT OF PERFUSION;  Surgeon: Ileana Roup, MD;  Location: [facility];  Service: General;  Laterality: N/A;   XI ROBOTIC ASSISTED LOWER ANTERIOR RESECTION N/A 10/17/2021   Procedure: XI ROBOTIC ASSISTED LOWER ANTERIOR RESECTION;  Surgeon: Ileana Roup, MD;  Location: [facility];  Service:  General;  Laterality: N/A;  GEN AND TAP BLOCK    I have reviewed the social history and family history with the patient and they are unchanged from previous note.  ALLERGIES:  has No Known Allergies.  MEDICATIONS:  Current Outpatient Medications  Medication Sig Dispense Refill   Acetaminophen (TYLENOL PO) Take 1,250 mg by mouth every 8 (eight) hours as needed.     Ascorbic Acid (VITAMIN C) 100 MG tablet Take 100 mg by mouth daily.     capecitabine (XELODA) 500 MG tablet Take 4 tablets (2,000 mg total) by mouth 2 (two) times daily after a meal. Take for 14 days on, 7 days off. Repeat every 21 days. 112 tablet 1   cholecalciferol (VITAMIN D3) 25 MCG (1000 UNIT) tablet Take 1,000 Units by mouth daily.     ferrous sulfate 325 (65 FE) MG tablet Take 325 mg by mouth daily with breakfast.     ibuprofen (ADVIL) 800 MG tablet Take 800 mg by mouth every 8 (eight) hours as needed.     Multiple Vitamin (MULTIVITAMIN WITH MINERALS) TABS tablet Take 1 tablet by mouth daily.     Omega-3 Fatty Acids (FISH OIL PO) Take 1 capsule by mouth daily.     ondansetron (ZOFRAN) 8 MG tablet Take 1 tablet (8 mg total) by mouth 2 (two) times daily as needed for refractory nausea / vomiting. Start on day 3 after chemotherapy. 30 tablet 1   prochlorperazine (COMPAZINE) 10 MG tablet Take 1 tablet (10 mg total) by mouth every 6 (six) hours as needed (Nausea or vomiting). 30 tablet 1   zinc gluconate 50 MG tablet Take 50 mg by mouth daily.     No current facility-administered medications for this visit.    PHYSICAL EXAMINATION: ECOG PERFORMANCE STATUS: 0 - Asymptomatic  Vitals:   11/30/21 1231  BP: 119/76  Pulse: 80  Resp: 18  Temp: 98.3 F (36.8 C)  SpO2: 99%   Wt Readings from Last 3 Encounters:  11/30/21 244 lb 3.2 oz (110.8 kg)  11/12/21 233 lb (105.7 kg)  10/17/21 241 lb (109.3 kg)     GENERAL:alert, no distress and comfortable SKIN: skin color normal, no rashes or significant lesions EYES: normal,  Conjunctiva are pink and non-injected, sclera clear  NEURO: alert & oriented x 3 with fluent speech  LABORATORY DATA:  I have reviewed the data as listed    Latest Ref Rng & Units 11/30/2021   11:49 AM 11/13/2021    3:41 PM 10/19/2021    4:55 AM  CBC  WBC 4.0 - 10.5 K/uL 7.2  7.6  9.0   Hemoglobin 13.0 - 17.0 g/dL 11.7  11.4  10.5   Hematocrit 39.0 - 52.0 % 35.1  35.5  33.0   Platelets 150 - 400 K/uL 236  355  278         Latest Ref Rng & Units 11/30/2021   11:49 AM 11/13/2021    3:41 PM 10/19/2021    4:55 AM  CMP  Glucose 70 - 99 mg/dL 105  130  88   BUN 6 -  20 mg/dL 15  16  9    Creatinine 0.61 - 1.24 mg/dL 1.16  1.12  1.20   Sodium 135 - 145 mmol/L 138  139  141   Potassium 3.5 - 5.1 mmol/L 3.8  4.0  3.9   Chloride 98 - 111 mmol/L 107  106  110   CO2 22 - 32 mmol/L 26  28  27    Calcium 8.9 - 10.3 mg/dL 9.2  9.4  8.7   Total Protein 6.5 - 8.1 g/dL 7.6  7.7    Total Bilirubin 0.3 - 1.2 mg/dL 0.8  0.7    Alkaline Phos 38 - 126 U/L 57  54    AST 15 - 41 U/L 20  20    ALT 0 - 44 U/L 21  28        RADIOGRAPHIC STUDIES: I have personally reviewed the radiological images as listed and agreed with the findings in the report. No results found.    No orders of the defined types were placed in this encounter.  All questions were answered. The patient knows to call the clinic with any problems, questions or concerns. No barriers to learning was detected. The total time spent in the appointment was 20 minutes.     Truitt Merle, MD 11/30/2021   I, Wilburn Mylar, am acting as scribe for Truitt Merle, MD.   I have reviewed the above documentation for accuracy and completeness, and I agree with the above.

## 2021-12-03 ENCOUNTER — Other Ambulatory Visit (HOSPITAL_COMMUNITY): Payer: Self-pay

## 2021-12-05 ENCOUNTER — Other Ambulatory Visit (HOSPITAL_COMMUNITY): Payer: Self-pay

## 2021-12-06 NOTE — Telephone Encounter (Signed)
Connected with Joseph Durkee [phone]) regarding Mychart message not read and no response to 11/20/2021 e-mail sent to [email or web address].  Advised of required authorization needed for H.I.M. (SW) to process records/PHI.  "Thank you.  When I get home, I can do that." Currently denies questions or needs.

## 2021-12-07 MED FILL — Dexamethasone Sodium Phosphate Inj 100 MG/10ML: INTRAMUSCULAR | Qty: 1 | Status: AC

## 2021-12-10 ENCOUNTER — Other Ambulatory Visit: Payer: Self-pay

## 2021-12-10 ENCOUNTER — Inpatient Hospital Stay (HOSPITAL_BASED_OUTPATIENT_CLINIC_OR_DEPARTMENT_OTHER): Payer: BC Managed Care – PPO | Admitting: Hematology

## 2021-12-10 ENCOUNTER — Other Ambulatory Visit (HOSPITAL_COMMUNITY): Payer: Self-pay

## 2021-12-10 ENCOUNTER — Inpatient Hospital Stay: Payer: BC Managed Care – PPO

## 2021-12-10 VITALS — BP 124/84 | HR 79 | Temp 98.5°F | Resp 16 | Ht 74.0 in | Wt 246.5 lb

## 2021-12-10 DIAGNOSIS — C187 Malignant neoplasm of sigmoid colon: Secondary | ICD-10-CM

## 2021-12-10 DIAGNOSIS — Z8052 Family history of malignant neoplasm of bladder: Secondary | ICD-10-CM | POA: Diagnosis not present

## 2021-12-10 DIAGNOSIS — D500 Iron deficiency anemia secondary to blood loss (chronic): Secondary | ICD-10-CM

## 2021-12-10 DIAGNOSIS — D630 Anemia in neoplastic disease: Secondary | ICD-10-CM | POA: Diagnosis not present

## 2021-12-10 DIAGNOSIS — Z800 Family history of malignant neoplasm of digestive organs: Secondary | ICD-10-CM | POA: Diagnosis not present

## 2021-12-10 DIAGNOSIS — Z5111 Encounter for antineoplastic chemotherapy: Secondary | ICD-10-CM | POA: Diagnosis not present

## 2021-12-10 LAB — CBC WITH DIFFERENTIAL/PLATELET
Abs Immature Granulocytes: 0.01 10*3/uL (ref 0.00–0.07)
Basophils Absolute: 0 10*3/uL (ref 0.0–0.1)
Basophils Relative: 1 %
Eosinophils Absolute: 0.5 10*3/uL (ref 0.0–0.5)
Eosinophils Relative: 8 %
HCT: 37 % — ABNORMAL LOW (ref 39.0–52.0)
Hemoglobin: 12.1 g/dL — ABNORMAL LOW (ref 13.0–17.0)
Immature Granulocytes: 0 %
Lymphocytes Relative: 35 %
Lymphs Abs: 1.9 10*3/uL (ref 0.7–4.0)
MCH: 26.8 pg (ref 26.0–34.0)
MCHC: 32.7 g/dL (ref 30.0–36.0)
MCV: 82 fL (ref 80.0–100.0)
Monocytes Absolute: 0.9 10*3/uL (ref 0.1–1.0)
Monocytes Relative: 17 %
Neutro Abs: 2.1 10*3/uL (ref 1.7–7.7)
Neutrophils Relative %: 39 %
Platelets: 311 10*3/uL (ref 150–400)
RBC: 4.51 MIL/uL (ref 4.22–5.81)
RDW: 16 % — ABNORMAL HIGH (ref 11.5–15.5)
WBC: 5.4 10*3/uL (ref 4.0–10.5)
nRBC: 0 % (ref 0.0–0.2)

## 2021-12-10 LAB — COMPREHENSIVE METABOLIC PANEL
ALT: 40 U/L (ref 0–44)
AST: 36 U/L (ref 15–41)
Albumin: 4.2 g/dL (ref 3.5–5.0)
Alkaline Phosphatase: 61 U/L (ref 38–126)
Anion gap: 6 (ref 5–15)
BUN: 16 mg/dL (ref 6–20)
CO2: 25 mmol/L (ref 22–32)
Calcium: 9.2 mg/dL (ref 8.9–10.3)
Chloride: 108 mmol/L (ref 98–111)
Creatinine, Ser: 1.1 mg/dL (ref 0.61–1.24)
GFR, Estimated: >60 mL/min (ref >60)
Glucose, Bld: 99 mg/dL (ref 70–99)
Potassium: 3.6 mmol/L (ref 3.5–5.1)
Sodium: 139 mmol/L (ref 135–145)
Total Bilirubin: 0.9 mg/dL (ref 0.3–1.2)
Total Protein: 7.8 g/dL (ref 6.5–8.1)

## 2021-12-10 LAB — IRON AND IRON BINDING CAPACITY (CC-WL,HP ONLY)
Iron: 37 ug/dL — ABNORMAL LOW (ref 45–182)
Saturation Ratios: 8 % — ABNORMAL LOW (ref 17.9–39.5)
TIBC: 465 ug/dL — ABNORMAL HIGH (ref 250–450)
UIBC: 428 ug/dL — ABNORMAL HIGH (ref 117–376)

## 2021-12-10 LAB — FERRITIN: Ferritin: 19 ng/mL — ABNORMAL LOW (ref 24–336)

## 2021-12-10 MED ORDER — DEXTROSE 5 % IV SOLN: Freq: Once | INTRAVENOUS | Status: AC

## 2021-12-10 MED ORDER — CAPECITABINE 500 MG PO TABS
850.0000 mg/m2 | ORAL_TABLET | Freq: Two times a day (BID) | ORAL | 1 refills | Status: DC
  Filled 2021-12-10 – 2021-12-21 (×2): qty 112, 14d supply, fill #0
  Filled 2022-01-10: qty 112, 14d supply, fill #1

## 2021-12-10 MED ORDER — SODIUM CHLORIDE 0.9 % IV SOLN
10.0000 mg | Freq: Once | INTRAVENOUS | Status: AC
  Administered 2021-12-10: 10 mg via INTRAVENOUS
  Filled 2021-12-10: qty 10

## 2021-12-10 MED ORDER — PALONOSETRON HCL INJECTION 0.25 MG/5ML
0.2500 mg | Freq: Once | INTRAVENOUS | Status: AC
  Administered 2021-12-10: 0.25 mg via INTRAVENOUS
  Filled 2021-12-10: qty 5

## 2021-12-10 MED ORDER — OXALIPLATIN CHEMO INJECTION 100 MG/20ML
128.0000 mg/m2 | Freq: Once | INTRAVENOUS | Status: AC
  Administered 2021-12-10: 300 mg via INTRAVENOUS
  Filled 2021-12-10: qty 60

## 2021-12-10 NOTE — Progress Notes (Signed)
Patient took 4tablet ('2000mg'$ ) PO Capectabine while in infusion today

## 2021-12-10 NOTE — Patient Instructions (Signed)
[facility] ONCOLOGY  Discharge Instructions: Thank you for choosing [facility] to provide your oncology and hematology care.   If you have a lab appointment with the [facility], please go directly to the [facility] and check in at the registration area.   Wear comfortable clothing and clothing appropriate for easy access to any Portacath or PICC line.   We strive to give you quality time with your provider. You may need to reschedule your appointment if you arrive late (15 or more minutes).  Arriving late affects you and other patients whose appointments are after yours.  Also, if you miss three or more appointments without notifying the office, you may be dismissed from the clinic at the provider's discretion.      For prescription refill requests, have your pharmacy contact our office and allow 72 hours for refills to be completed.    Today you received the following chemotherapy and/or immunotherapy agent: Oxaliplatin      To help prevent nausea and vomiting after your treatment, we encourage you to take your nausea medication as directed.  BELOW ARE SYMPTOMS THAT SHOULD BE REPORTED IMMEDIATELY: *FEVER GREATER THAN 100.4 F (38 C) OR HIGHER *CHILLS OR SWEATING *NAUSEA AND VOMITING THAT IS NOT CONTROLLED WITH YOUR NAUSEA MEDICATION *UNUSUAL SHORTNESS OF BREATH *UNUSUAL BRUISING OR BLEEDING *URINARY PROBLEMS (pain or burning when urinating, or frequent urination) *BOWEL PROBLEMS (unusual diarrhea, constipation, pain near the anus) TENDERNESS IN MOUTH AND THROAT WITH OR WITHOUT PRESENCE OF ULCERS (sore throat, sores in mouth, or a toothache) UNUSUAL RASH, SWELLING OR PAIN  UNUSUAL VAGINAL DISCHARGE OR ITCHING   Items with * indicate a potential emergency and should be followed up as soon as possible or go to the Emergency Department if any problems should occur.  Please show the CHEMOTHERAPY ALERT CARD or IMMUNOTHERAPY ALERT CARD at check-in to  the Emergency Department and triage nurse.  Should you have questions after your visit or need to cancel or reschedule your appointment, please contact [facility]  Dept: [phone]  and follow the prompts.  Office hours are 8:00 a.m. to 4:30 p.m. Monday - Friday. Please note that voicemails left after 4:00 p.m. may not be returned until the following business day.  We are closed weekends and major holidays. You have access to a nurse at all times for urgent questions. Please call the main number to the clinic Dept: [phone] and follow the prompts.   For any non-urgent questions, you may also contact your provider using MyChart. We now offer e-Visits for anyone 55 and older to request care online for non-urgent symptoms. For details visit [email or web address].   Also download the MyChart app! Go to the app store, search "MyChart", open the app, select [facility], and log in with your MyChart username and password.  Masks are optional in the cancer centers. If you would like for your care team to wear a mask while they are taking care of you, please let them know. For doctor visits, patients may have with them one support person who is at least 36 years old. At this time, visitors are not allowed in the infusion area.

## 2021-12-10 NOTE — Progress Notes (Signed)
Philip Clark   Telephone:(336) [phone] Fax:(336) [phone]   Clinic Follow up Note   Patient Care Team: Libby Maw, MD as PCP - General (Family Medicine) Berniece Salines, DO as PCP - Cardiology (Cardiology) Truitt Merle, MD as Consulting Physician (Oncology) Royston Bake, RN as Oncology Nurse Navigator (Oncology)  Date of Service:  12/10/2021  CHIEF COMPLAINT: f/u of sigmoid colon cancer  CURRENT THERAPY:  Adjuvant CAPEOX, q21d, starting 11/19/21  ASSESSMENT & PLAN:  Philip Clark is a 36 y.o. male with   1. Adenocarcinoma of sigmoid colon, G1, stage IIIB p(T3, N1a) cM0, stage IIIB, MSS -initially developed abdominal pain/cramping in 02/2021 and rectal bleeding since 05/2021. Colonoscopy 09/11/21 showed 6 cm sigmoid colon mass located 20-26 cm from anal verge. Biopsy confirmed adenocarcinoma. -staging CT 4/25 and MRI 4/27 showed suspicious mesenteric lymph nodes, no distant metastasis. -s/p surgical resection 10/17/21 with Dr. Dema Severin, path showed clear margins and one positive node -given his stage IIIB disease, he started adjuvant CAPEOX on 11/19/21. He tolerated cycle one well with mild cold sensitivity, low appetite. He has recovered well with mild fatigue. -labs reviewed, CBC improved. Adequate to proceed with cycle 2 today. He has not yet taken Xeloda today, will start here in infusion room. -will continue chemo, plan for a total of 4 cycles    2. Mild Anemia, iron deficiency -secondary to #1 -last labs 07/2021 showed iron 62, ferritin 6.4. Hgb has been slowly decreasing since then, most recently 10.5 on 10/19/21. -hgb improved to 12.1 today (12/10/21). Iron and ferritin are pending.   3. Genetics -genetic testing performed 10/09/21, results were negative. -he has a younger brother. I discussed that the recommendation is for his brother to proceed with colonoscopy now. He expressed understanding.     PLAN:  -proceed with C2 oxali today as scheduled -start  Xeloda today in infusion room. -lab, f/u, and CAPEOX on 8/7 and 8/28    No problem-specific Assessment & Plan notes found for this encounter.   SUMMARY OF ONCOLOGIC HISTORY: Oncology History Overview Note   Cancer Staging  Adenocarcinoma of sigmoid colon [facility]) Staging form: Colon and Rectum, AJCC 8th Edition - Pathologic stage from 10/17/2021: Stage IIIB (pT3, pN1a, cM0) - Signed by Truitt Merle, MD on 11/12/2021    Adenocarcinoma of sigmoid colon ([place])  09/11/2021 Procedure   Colonoscopy, Dr. Bryan Lemma  Impression - One 8 mm polyp in the ascending colon, removed with a cold snare. Resected and retrieved. - One 4 mm polyp in the sigmoid colon, removed with a cold snare. Resected and retrieved. - Malignant partially obstructing tumor in the sigmoid colon, located 20-26 cm from the anal verge. This was traversed. Biopsied. Tattoo placed 1-2 cm distal to the lesion. - One 2 mm polyp in the rectum, removed with a cold biopsy forceps. Resected and retrieved. - The distal rectum and anal verge are normal on retroflexion view. - The examined portion of the ileum was normal.   09/11/2021 Initial Biopsy   Diagnosis 1. Duodenum, Biopsy - DUODENAL MUCOSA WITH NORMAL VILLOUS ARCHITECTURE. - NO VILLOUS ATROPHY OR INCREASED INTRAEPITHELIAL LYMPHOCYTES. 2. Duodenum, Biopsy, bulb - DUODENAL BULB WITH PEPTIC INJURY. 3. Ascending Colon Polyp - MULTIPLE FRAGMENTS OF TUBULAR ADENOMA WITHOUT HIGH GRADE DYSPLASIA. 4. Sigmoid Colon Polyp - INVASIVE COLONIC ADENOCARCINOMA. - SEE MICROSCOPIC DESCRIPTION. 5. Colon, polyp(s), mass - INVASIVE COLONIC ADENOCARCINOMA. - SEE MICROSCOPIC DESCRIPTION. 6. Rectum, polyp(s) - HYPERPLASTIC POLYP. Microscopic Comment 4. and 5. The carcinoma in parts 4 and 5 have  identical morphologic features.   09/18/2021 Imaging   EXAM: CT CHEST, ABDOMEN, AND PELVIS WITH CONTRAST  IMPRESSION: 1. Short segment asymmetric wall thickening of the sigmoid colon is consistent  with patient's known sigmoid colon cancer. 2. Prominent lymph nodes in the sigmoid mesentery measure up to 6 mm in short axis, which likely reflect local nodal disease involvement. 3. No evidence of distant metastatic disease in the chest, abdomen or pelvis. 4. No evidence of high-grade bowel obstruction. However, there is a large volume of formed stool throughout the colon   09/20/2021 Imaging   EXAM: MRI PELVIS WITHOUT AND WITH CONTRAST  IMPRESSION: 1. Partially obstructing malignancy of the mid sigmoid colon with prominent sigmoid mesenteric lymph nodes suspicious for early nodal metastases. 2. No evidence bowel obstruction, perforation, ascites or peritoneal nodularity. 3. Left paracentral disc protrusion at L5-S1 with resulting left S1 nerve root encroachment.   10/02/2021 Initial Diagnosis   Adenocarcinoma of sigmoid colon ([place])   10/09/2021 Genetic Testing   [facility] CancerNext-Expanded Panel was Negative. Report date is 10/26/2021.  The CancerNext-Expanded gene panel offered by [facility] and includes sequencing, rearrangement, and RNA analysis for the following 77 genes: AIP, ALK, APC, ATM, AXIN2, BAP1, BARD1, BLM, BMPR1A, BRCA1, BRCA2, BRIP1, CDC73, CDH1, CDK4, CDKN1B, CDKN2A, CHEK2, CTNNA1, DICER1, FANCC, FH, FLCN, GALNT12, KIF1B, LZTR1, MAX, MEN1, MET, MLH1, MSH2, MSH3, MSH6, MUTYH, NBN, NF1, NF2, NTHL1, PALB2, PHOX2B, PMS2, POT1, PRKAR1A, PTCH1, PTEN, RAD51C, RAD51D, RB1, RECQL, RET, SDHA, SDHAF2, SDHB, SDHC, SDHD, SMAD4, SMARCA4, SMARCB1, SMARCE1, STK11, SUFU, TMEM127, TP53, TSC1, TSC2, VHL and XRCC2 (sequencing and deletion/duplication); EGFR, EGLN1, HOXB13, KIT, MITF, PDGFRA, POLD1, and POLE (sequencing only); EPCAM and GREM1 (deletion/duplication only).    10/17/2021 Definitive Surgery   FINAL MICROSCOPIC DIAGNOSIS:   A. COLON, RECTOSIGMOID, RESECTION:  -  Invasive well differentiated adenocarcinoma (6 cm in greatest  dimension) with extension through submucosa, muscularis  propria and mesenteric/subserosal adipose tissue with abutment but not definitive involvement of the serosal membrane.  -  Focal evidence of precursor lesion (i.e. tubular adenoma)  -  Margins negative (proximal 5 cm, distal 7 cm and mesenteric 6 cm)  -  Negative for lymphovascular and perineural invasion.  -  Focal high-grade tumor budding present.  -  1 of 22 lymph nodes positive for malignancy  pT3 pN1 pM n/a   B. FINAL DISTAL MARGIN:  -   Unremarkable colonic mucosa, negative for malignancy.    ADDENDUM:  Mismatch Repair Protein (IHC)  SUMMARY INTERPRETATION: NORMAL   10/17/2021 Cancer Staging   Staging form: Colon and Rectum, AJCC 8th Edition - Pathologic stage from 10/17/2021: Stage IIIB (pT3, pN1a, cM0) - Signed by Truitt Merle, MD on 11/12/2021 Total positive nodes: 1 Histologic grading system: 4 grade system Histologic grade (G): G2 Residual tumor (R): R0 - None   11/19/2021 -  Chemotherapy   Patient is on Treatment Plan : COLORECTAL Xelox (Capeox) q21d        INTERVAL HISTORY:  Philip Clark is here for a follow up of colon cancer. He was last seen by me on 11/30/21. He presents to the clinic accompanied by his mother. He reports he has recovered well. He notes he is almost back to normal except for some fatigue.    All other systems were reviewed with the patient and are negative.  MEDICAL HISTORY:  Past Medical History:  Diagnosis Date   Allergy    Anemia    Anxiety    Bronchitis    Cancer ([place])  Depression     SURGICAL HISTORY: Past Surgical History:  Procedure Laterality Date   ANTERIOR CRUCIATE LIGAMENT (ACL) REVISION Left    FLEXIBLE SIGMOIDOSCOPY N/A 10/17/2021   Procedure: FLEXIBLE SIGMOIDOSCOPY, INTRAOPERATIVE ASSESSMENT OF PERFUSION;  Surgeon: Ileana Roup, MD;  Location: [facility];  Service: General;  Laterality: N/A;   XI ROBOTIC ASSISTED LOWER ANTERIOR RESECTION N/A 10/17/2021   Procedure: XI ROBOTIC ASSISTED LOWER ANTERIOR RESECTION;   Surgeon: Ileana Roup, MD;  Location: [facility];  Service: General;  Laterality: N/A;  GEN AND TAP BLOCK    I have reviewed the social history and family history with the patient and they are unchanged from previous note.  ALLERGIES:  has No Known Allergies.  MEDICATIONS:  Current Outpatient Medications  Medication Sig Dispense Refill   Acetaminophen (TYLENOL PO) Take 1,250 mg by mouth every 8 (eight) hours as needed.     Ascorbic Acid (VITAMIN C) 100 MG tablet Take 100 mg by mouth daily.     capecitabine (XELODA) 500 MG tablet Take 4 tablets (2,000 mg total) by mouth 2 (two) times daily after a meal. Take for 14 days on, 7 days off. Repeat every 21 days. 112 tablet 1   cholecalciferol (VITAMIN D3) 25 MCG (1000 UNIT) tablet Take 1,000 Units by mouth daily.     ferrous sulfate 325 (65 FE) MG tablet Take 325 mg by mouth daily with breakfast.     ibuprofen (ADVIL) 800 MG tablet Take 800 mg by mouth every 8 (eight) hours as needed.     Multiple Vitamin (MULTIVITAMIN WITH MINERALS) TABS tablet Take 1 tablet by mouth daily.     Omega-3 Fatty Acids (FISH OIL PO) Take 1 capsule by mouth daily.     ondansetron (ZOFRAN) 8 MG tablet Take 1 tablet (8 mg total) by mouth 2 (two) times daily as needed for refractory nausea / vomiting. Start on day 3 after chemotherapy. 30 tablet 1   prochlorperazine (COMPAZINE) 10 MG tablet Take 1 tablet (10 mg total) by mouth every 6 (six) hours as needed (Nausea or vomiting). 30 tablet 1   zinc gluconate 50 MG tablet Take 50 mg by mouth daily.     No current facility-administered medications for this visit.    PHYSICAL EXAMINATION: ECOG PERFORMANCE STATUS: 0 - Asymptomatic  Vitals:   12/10/21 0816  BP: 124/84  Pulse: 79  Resp: 16  Temp: 98.5 F (36.9 C)  SpO2: 98%   Wt Readings from Last 3 Encounters:  12/10/21 246 lb 8 oz (111.8 kg)  11/30/21 244 lb 3.2 oz (110.8 kg)  11/12/21 233 lb (105.7 kg)     GENERAL:alert, no distress and comfortable SKIN:  skin color normal, no rashes or significant lesions EYES: normal, Conjunctiva are pink and non-injected, sclera clear  NEURO: alert & oriented x 3 with fluent speech  LABORATORY DATA:  I have reviewed the data as listed    Latest Ref Rng & Units 12/10/2021    8:09 AM 11/30/2021   11:49 AM 11/13/2021    3:41 PM  CBC  WBC 4.0 - 10.5 K/uL 5.4  7.2  7.6   Hemoglobin 13.0 - 17.0 g/dL 12.1  11.7  11.4   Hematocrit 39.0 - 52.0 % 37.0  35.1  35.5   Platelets 150 - 400 K/uL 311  236  355         Latest Ref Rng & Units 12/10/2021    8:09 AM 11/30/2021   11:49 AM 11/13/2021    3:41  PM  CMP  Glucose 70 - 99 mg/dL 99  105  130   BUN 6 - 20 mg/dL 16  15  16    Creatinine 0.61 - 1.24 mg/dL 1.10  1.16  1.12   Sodium 135 - 145 mmol/L 139  138  139   Potassium 3.5 - 5.1 mmol/L 3.6  3.8  4.0   Chloride 98 - 111 mmol/L 108  107  106   CO2 22 - 32 mmol/L 25  26  28    Calcium 8.9 - 10.3 mg/dL 9.2  9.2  9.4   Total Protein 6.5 - 8.1 g/dL 7.8  7.6  7.7   Total Bilirubin 0.3 - 1.2 mg/dL 0.9  0.8  0.7   Alkaline Phos 38 - 126 U/L 61  57  54   AST 15 - 41 U/L 36  20  20   ALT 0 - 44 U/L 40  21  28       RADIOGRAPHIC STUDIES: I have personally reviewed the radiological images as listed and agreed with the findings in the report. No results found.    No orders of the defined types were placed in this encounter.  All questions were answered. The patient knows to call the clinic with any problems, questions or concerns. No barriers to learning was detected. The total time spent in the appointment was 30 minutes.     Truitt Merle, MD 12/10/2021   I, Wilburn Mylar, am acting as scribe for Truitt Merle, MD.   I have reviewed the above documentation for accuracy and completeness, and I agree with the above.

## 2021-12-14 NOTE — Telephone Encounter (Signed)
[facility] ROI for patient signature.  Unable to send completed forms to (SW) H.I.M.

## 2021-12-17 ENCOUNTER — Other Ambulatory Visit: Payer: Self-pay

## 2021-12-18 ENCOUNTER — Other Ambulatory Visit: Payer: Self-pay

## 2021-12-21 ENCOUNTER — Other Ambulatory Visit (HOSPITAL_COMMUNITY): Payer: Self-pay

## 2021-12-25 ENCOUNTER — Other Ambulatory Visit: Payer: Self-pay

## 2021-12-28 ENCOUNTER — Other Ambulatory Visit (HOSPITAL_COMMUNITY): Payer: Self-pay

## 2021-12-28 MED FILL — Dexamethasone Sodium Phosphate Inj 100 MG/10ML: INTRAMUSCULAR | Qty: 1 | Status: AC

## 2021-12-31 ENCOUNTER — Inpatient Hospital Stay: Payer: BC Managed Care – PPO | Admitting: Hematology

## 2021-12-31 ENCOUNTER — Other Ambulatory Visit: Payer: Self-pay

## 2021-12-31 ENCOUNTER — Encounter: Payer: Self-pay | Admitting: Hematology

## 2021-12-31 ENCOUNTER — Inpatient Hospital Stay: Payer: BC Managed Care – PPO

## 2021-12-31 ENCOUNTER — Inpatient Hospital Stay: Payer: BC Managed Care – PPO | Attending: Genetic Counselor

## 2021-12-31 VITALS — BP 127/77 | HR 82 | Temp 98.4°F | Resp 18 | Ht 74.0 in | Wt 247.5 lb

## 2021-12-31 VITALS — BP 132/86 | HR 84 | Resp 18

## 2021-12-31 DIAGNOSIS — Z800 Family history of malignant neoplasm of digestive organs: Secondary | ICD-10-CM | POA: Insufficient documentation

## 2021-12-31 DIAGNOSIS — C187 Malignant neoplasm of sigmoid colon: Secondary | ICD-10-CM | POA: Diagnosis not present

## 2021-12-31 DIAGNOSIS — D500 Iron deficiency anemia secondary to blood loss (chronic): Secondary | ICD-10-CM

## 2021-12-31 DIAGNOSIS — D630 Anemia in neoplastic disease: Secondary | ICD-10-CM | POA: Diagnosis not present

## 2021-12-31 DIAGNOSIS — Z5111 Encounter for antineoplastic chemotherapy: Secondary | ICD-10-CM | POA: Diagnosis not present

## 2021-12-31 DIAGNOSIS — Z8052 Family history of malignant neoplasm of bladder: Secondary | ICD-10-CM | POA: Insufficient documentation

## 2021-12-31 LAB — COMPREHENSIVE METABOLIC PANEL
ALT: 37 U/L (ref 0–44)
AST: 36 U/L (ref 15–41)
Albumin: 4.3 g/dL (ref 3.5–5.0)
Alkaline Phosphatase: 60 U/L (ref 38–126)
Anion gap: 5 (ref 5–15)
BUN: 14 mg/dL (ref 6–20)
CO2: 26 mmol/L (ref 22–32)
Calcium: 9.1 mg/dL (ref 8.9–10.3)
Chloride: 105 mmol/L (ref 98–111)
Creatinine, Ser: 1.12 mg/dL (ref 0.61–1.24)
GFR, Estimated: >60 mL/min (ref >60)
Glucose, Bld: 114 mg/dL — ABNORMAL HIGH (ref 70–99)
Potassium: 3.9 mmol/L (ref 3.5–5.1)
Sodium: 136 mmol/L (ref 135–145)
Total Bilirubin: 1.2 mg/dL (ref 0.3–1.2)
Total Protein: 7.8 g/dL (ref 6.5–8.1)

## 2021-12-31 LAB — CBC WITH DIFFERENTIAL/PLATELET
Abs Immature Granulocytes: 0.01 10*3/uL (ref 0.00–0.07)
Basophils Absolute: 0 10*3/uL (ref 0.0–0.1)
Basophils Relative: 1 %
Eosinophils Absolute: 0.3 10*3/uL (ref 0.0–0.5)
Eosinophils Relative: 7 %
HCT: 37.1 % — ABNORMAL LOW (ref 39.0–52.0)
Hemoglobin: 12.4 g/dL — ABNORMAL LOW (ref 13.0–17.0)
Immature Granulocytes: 0 %
Lymphocytes Relative: 36 %
Lymphs Abs: 1.5 10*3/uL (ref 0.7–4.0)
MCH: 27.9 pg (ref 26.0–34.0)
MCHC: 33.4 g/dL (ref 30.0–36.0)
MCV: 83.4 fL (ref 80.0–100.0)
Monocytes Absolute: 0.6 10*3/uL (ref 0.1–1.0)
Monocytes Relative: 15 %
Neutro Abs: 1.7 10*3/uL (ref 1.7–7.7)
Neutrophils Relative %: 41 %
Platelets: 140 10*3/uL — ABNORMAL LOW (ref 150–400)
RBC: 4.45 MIL/uL (ref 4.22–5.81)
RDW: 18.4 % — ABNORMAL HIGH (ref 11.5–15.5)
WBC: 4.1 10*3/uL (ref 4.0–10.5)
nRBC: 0 % (ref 0.0–0.2)

## 2021-12-31 LAB — IRON AND IRON BINDING CAPACITY (CC-WL,HP ONLY)
Iron: 90 ug/dL (ref 45–182)
Saturation Ratios: 19 % (ref 17.9–39.5)
TIBC: 466 ug/dL — ABNORMAL HIGH (ref 250–450)
UIBC: 376 ug/dL (ref 117–376)

## 2021-12-31 MED ORDER — SODIUM CHLORIDE 0.9 % IV SOLN
10.0000 mg | Freq: Once | INTRAVENOUS | Status: AC
  Administered 2021-12-31: 10 mg via INTRAVENOUS
  Filled 2021-12-31: qty 10

## 2021-12-31 MED ORDER — PALONOSETRON HCL INJECTION 0.25 MG/5ML
0.2500 mg | Freq: Once | INTRAVENOUS | Status: AC
  Administered 2021-12-31: 0.25 mg via INTRAVENOUS
  Filled 2021-12-31: qty 5

## 2021-12-31 MED ORDER — DEXTROSE 5 % IV SOLN: Freq: Once | INTRAVENOUS | Status: AC

## 2021-12-31 MED ORDER — OXALIPLATIN CHEMO INJECTION 100 MG/20ML
128.0000 mg/m2 | Freq: Once | INTRAVENOUS | Status: AC
  Administered 2021-12-31: 300 mg via INTRAVENOUS
  Filled 2021-12-31: qty 60

## 2021-12-31 NOTE — Progress Notes (Signed)
[place]   Telephone:(336) [phone] Fax:(336) [phone]   Clinic Follow up Note   Patient Care Team: Libby Maw, MD as PCP - General (Family Medicine) Berniece Salines, DO as PCP - Cardiology (Cardiology) Truitt Merle, MD as Consulting Physician (Oncology) Royston Bake, RN as Oncology Nurse Navigator (Oncology)  Date of Service:  12/31/2021  CHIEF COMPLAINT: f/u of sigmoid colon cancer  CURRENT THERAPY:  Adjuvant CAPEOX, q21d, starting 11/19/21  ASSESSMENT & PLAN:  Philip Clark is a 36 y.o. male with   1. Adenocarcinoma of sigmoid colon, G1, stage IIIB p(T3, N1a) cM0, stage IIIB, MSS -initially developed abdominal pain/cramping in 02/2021 and rectal bleeding since 05/2021. Colonoscopy 09/11/21 showed 6 cm sigmoid colon mass located 20-26 cm from anal verge. Biopsy confirmed adenocarcinoma. -staging CT 4/25 and MRI 4/27 showed suspicious mesenteric lymph nodes, no distant metastasis. -s/p surgical resection 10/17/21 with Dr. Dema Severin, path showed clear margins and one positive node -given his stage IIIB disease, he started adjuvant CAPEOX on 11/19/21. He tolerated first 2 cycles well with mild cold sensitivity, low appetite and leg pain. He has recovered well with mild fatigue. -labs reviewed, hgb improved but plt down. Adequate to proceed with cycle 3 today.  -will continue chemo, plan for a total of 4 cycles.  --I discussed the risk of cancer recurrence in the future. I discussed the surveillance plan, which is a physical exam and lab test (including CBC, CMP and CEA) every 3 months for the first 2 years, then every 6-12 months, colonoscopy in one year, and surveilliance CT scan every 6-12 month for up to 3-4 year.  -first surveillance scan in 02/2022.   2. Mild Anemia, iron deficiency -secondary to #1 -last labs 07/2021 showed iron 62, ferritin 6.4. Hgb has been slowly decreasing since then, most recently 10.5 on 10/19/21. -hgb improved to 12.4 today (12/31/21). Iron  panel is pending.   3. Genetics -genetic testing performed 10/09/21, results were negative.     PLAN:  -proceed with C3 oxali today as scheduled -continue Xeloda at same dose -lab, f/u, and CAPEOX on 8/28    No problem-specific Assessment & Plan notes found for this encounter.   SUMMARY OF ONCOLOGIC HISTORY: Oncology History Overview Note   Cancer Staging  Adenocarcinoma of sigmoid colon [facility]) Staging form: Colon and Rectum, AJCC 8th Edition - Pathologic stage from 10/17/2021: Stage IIIB (pT3, pN1a, cM0) - Signed by Truitt Merle, MD on 11/12/2021    Adenocarcinoma of sigmoid colon ([place])  09/11/2021 Procedure   Colonoscopy, Dr. Bryan Lemma  Impression - One 8 mm polyp in the ascending colon, removed with a cold snare. Resected and retrieved. - One 4 mm polyp in the sigmoid colon, removed with a cold snare. Resected and retrieved. - Malignant partially obstructing tumor in the sigmoid colon, located 20-26 cm from the anal verge. This was traversed. Biopsied. Tattoo placed 1-2 cm distal to the lesion. - One 2 mm polyp in the rectum, removed with a cold biopsy forceps. Resected and retrieved. - The distal rectum and anal verge are normal on retroflexion view. - The examined portion of the ileum was normal.   09/11/2021 Initial Biopsy   Diagnosis 1. Duodenum, Biopsy - DUODENAL MUCOSA WITH NORMAL VILLOUS ARCHITECTURE. - NO VILLOUS ATROPHY OR INCREASED INTRAEPITHELIAL LYMPHOCYTES. 2. Duodenum, Biopsy, bulb - DUODENAL BULB WITH PEPTIC INJURY. 3. Ascending Colon Polyp - MULTIPLE FRAGMENTS OF TUBULAR ADENOMA WITHOUT HIGH GRADE DYSPLASIA. 4. Sigmoid Colon Polyp - INVASIVE COLONIC ADENOCARCINOMA. - SEE MICROSCOPIC DESCRIPTION.  5. Colon, polyp(s), mass - INVASIVE COLONIC ADENOCARCINOMA. - SEE MICROSCOPIC DESCRIPTION. 6. Rectum, polyp(s) - HYPERPLASTIC POLYP. Microscopic Comment 4. and 5. The carcinoma in parts 4 and 5 have identical morphologic features.   09/18/2021 Imaging    EXAM: CT CHEST, ABDOMEN, AND PELVIS WITH CONTRAST  IMPRESSION: 1. Short segment asymmetric wall thickening of the sigmoid colon is consistent with patient's known sigmoid colon cancer. 2. Prominent lymph nodes in the sigmoid mesentery measure up to 6 mm in short axis, which likely reflect local nodal disease involvement. 3. No evidence of distant metastatic disease in the chest, abdomen or pelvis. 4. No evidence of high-grade bowel obstruction. However, there is a large volume of formed stool throughout the colon   09/20/2021 Imaging   EXAM: MRI PELVIS WITHOUT AND WITH CONTRAST  IMPRESSION: 1. Partially obstructing malignancy of the mid sigmoid colon with prominent sigmoid mesenteric lymph nodes suspicious for early nodal metastases. 2. No evidence bowel obstruction, perforation, ascites or peritoneal nodularity. 3. Left paracentral disc protrusion at L5-S1 with resulting left S1 nerve root encroachment.   10/02/2021 Initial Diagnosis   Adenocarcinoma of sigmoid colon ([place])   10/09/2021 Genetic Testing   [facility] CancerNext-Expanded Panel was Negative. Report date is 10/26/2021.  The CancerNext-Expanded gene panel offered by [facility] and includes sequencing, rearrangement, and RNA analysis for the following 77 genes: AIP, ALK, APC, ATM, AXIN2, BAP1, BARD1, BLM, BMPR1A, BRCA1, BRCA2, BRIP1, CDC73, CDH1, CDK4, CDKN1B, CDKN2A, CHEK2, CTNNA1, DICER1, FANCC, FH, FLCN, GALNT12, KIF1B, LZTR1, MAX, MEN1, MET, MLH1, MSH2, MSH3, MSH6, MUTYH, NBN, NF1, NF2, NTHL1, PALB2, PHOX2B, PMS2, POT1, PRKAR1A, PTCH1, PTEN, RAD51C, RAD51D, RB1, RECQL, RET, SDHA, SDHAF2, SDHB, SDHC, SDHD, SMAD4, SMARCA4, SMARCB1, SMARCE1, STK11, SUFU, TMEM127, TP53, TSC1, TSC2, VHL and XRCC2 (sequencing and deletion/duplication); EGFR, EGLN1, HOXB13, KIT, MITF, PDGFRA, POLD1, and POLE (sequencing only); EPCAM and GREM1 (deletion/duplication only).    10/17/2021 Definitive Surgery   FINAL MICROSCOPIC DIAGNOSIS:   A. COLON,  RECTOSIGMOID, RESECTION:  -  Invasive well differentiated adenocarcinoma (6 cm in greatest  dimension) with extension through submucosa, muscularis propria and mesenteric/subserosal adipose tissue with abutment but not definitive involvement of the serosal membrane.  -  Focal evidence of precursor lesion (i.e. tubular adenoma)  -  Margins negative (proximal 5 cm, distal 7 cm and mesenteric 6 cm)  -  Negative for lymphovascular and perineural invasion.  -  Focal high-grade tumor budding present.  -  1 of 22 lymph nodes positive for malignancy  pT3 pN1 pM n/a   B. FINAL DISTAL MARGIN:  -   Unremarkable colonic mucosa, negative for malignancy.    ADDENDUM:  Mismatch Repair Protein ([facility]HC)  SUMMARY INTERPRETATION: NORMAL   10/17/2021 Cancer Staging   Staging form: Colon and Rectum, AJCC 8th Edition - Pathologic stage from 10/17/2021: Stage IIIB (pT3, pN1a, cM0) - Signed by Truitt Merle, MD on 11/12/2021 Total positive nodes: 1 Histologic grading system: 4 grade system Histologic grade (G): G2 Residual tumor (R): R0 - None   11/19/2021 -  Chemotherapy   Patient is on Treatment Plan : COLORECTAL Xelox (Capeox) q21d        INTERVAL HISTORY:  Philip Clark is here for a follow up of colon cancer. He was last seen by me on 12/10/21. He presents to the clinic accompanied by his wife. He reports he continues to tolerate treatment well, with his symptoms improving after a week. He notes some leg pain for 1-2 days after treatment causing difficulty sleeping.   All other systems  were reviewed with the patient and are negative.  MEDICAL HISTORY:  Past Medical History:  Diagnosis Date   Allergy    Anemia    Anxiety    Bronchitis    Cancer ([place])    Depression     SURGICAL HISTORY: Past Surgical History:  Procedure Laterality Date   ANTERIOR CRUCIATE LIGAMENT (ACL) REVISION Left    FLEXIBLE SIGMOIDOSCOPY N/A 10/17/2021   Procedure: FLEXIBLE SIGMOIDOSCOPY, INTRAOPERATIVE ASSESSMENT OF  PERFUSION;  Surgeon: Ileana Roup, MD;  Location: [facility];  Service: General;  Laterality: N/A;   XI ROBOTIC ASSISTED LOWER ANTERIOR RESECTION N/A 10/17/2021   Procedure: XI ROBOTIC ASSISTED LOWER ANTERIOR RESECTION;  Surgeon: Ileana Roup, MD;  Location: [facility];  Service: General;  Laterality: N/A;  GEN AND TAP BLOCK    I have reviewed the social history and family history with the patient and they are unchanged from previous note.  ALLERGIES:  has No Known Allergies.  MEDICATIONS:  Current Outpatient Medications  Medication Sig Dispense Refill   Acetaminophen (TYLENOL PO) Take 1,250 mg by mouth every 8 (eight) hours as needed.     Ascorbic Acid (VITAMIN C) 100 MG tablet Take 100 mg by mouth daily.     capecitabine (XELODA) 500 MG tablet Take 4 tablets (2,000 mg total) by mouth 2 (two) times daily after a meal. Take for 14 days on, 7 days off. Repeat every 21 days. 112 tablet 1   cholecalciferol (VITAMIN D3) 25 MCG (1000 UNIT) tablet Take 1,000 Units by mouth daily.     ferrous sulfate 325 (65 FE) MG tablet Take 325 mg by mouth daily with breakfast.     ibuprofen (ADVIL) 800 MG tablet Take 800 mg by mouth every 8 (eight) hours as needed.     Multiple Vitamin (MULTIVITAMIN WITH MINERALS) TABS tablet Take 1 tablet by mouth daily.     Omega-3 Fatty Acids (FISH OIL PO) Take 1 capsule by mouth daily.     ondansetron (ZOFRAN) 8 MG tablet Take 1 tablet (8 mg total) by mouth 2 (two) times daily as needed for refractory nausea / vomiting. Start on day 3 after chemotherapy. 30 tablet 1   prochlorperazine (COMPAZINE) 10 MG tablet Take 1 tablet (10 mg total) by mouth every 6 (six) hours as needed (Nausea or vomiting). 30 tablet 1   zinc gluconate 50 MG tablet Take 50 mg by mouth daily.     No current facility-administered medications for this visit.   Facility-Administered Medications Ordered in Other Visits  Medication Dose Route Frequency Provider Last Rate Last Admin   dexamethasone  (DECADRON) 10 mg in sodium chloride 0.9 % 50 mL IVPB  10 mg Intravenous Once Truitt Merle, MD       dextrose 5 % solution   Intravenous Once Truitt Merle, MD       oxaliplatin (ELOXATIN) 305 mg in dextrose 5 % 500 mL chemo infusion  130 mg/m2 (Treatment Plan Recorded) Intravenous Once Truitt Merle, MD       palonosetron (ALOXI) injection 0.25 mg  0.25 mg Intravenous Once Truitt Merle, MD        PHYSICAL EXAMINATION: ECOG PERFORMANCE STATUS: 1 - Symptomatic but completely ambulatory  Vitals:   12/31/21 0915  BP: 127/77  Pulse: 82  Resp: 18  Temp: 98.4 F (36.9 C)  SpO2: 98%   Wt Readings from Last 3 Encounters:  12/31/21 247 lb 8 oz (112.3 kg)  12/10/21 246 lb 8 oz (111.8 kg)  11/30/21 244 lb  3.2 oz (110.8 kg)     GENERAL:alert, no distress and comfortable SKIN: skin color normal, no rashes or significant lesions EYES: normal, Conjunctiva are pink and non-injected, sclera clear  NEURO: alert & oriented x 3 with fluent speech  LABORATORY DATA:  I have reviewed the data as listed    Latest Ref Rng & Units 12/31/2021    9:06 AM 12/10/2021    8:09 AM 11/30/2021   11:49 AM  CBC  WBC 4.0 - 10.5 K/uL 4.1  5.4  7.2   Hemoglobin 13.0 - 17.0 g/dL 12.4  12.1  11.7   Hematocrit 39.0 - 52.0 % 37.1  37.0  35.1   Platelets 150 - 400 K/uL 140  311  236         Latest Ref Rng & Units 12/31/2021    9:06 AM 12/10/2021    8:09 AM 11/30/2021   11:49 AM  CMP  Glucose 70 - 99 mg/dL 114  99  105   BUN 6 - 20 mg/dL 14  16  15    Creatinine 0.61 - 1.24 mg/dL 1.12  1.10  1.16   Sodium 135 - 145 mmol/L 136  139  138   Potassium 3.5 - 5.1 mmol/L 3.9  3.6  3.8   Chloride 98 - 111 mmol/L 105  108  107   CO2 22 - 32 mmol/L 26  25  26    Calcium 8.9 - 10.3 mg/dL 9.1  9.2  9.2   Total Protein 6.5 - 8.1 g/dL 7.8  7.8  7.6   Total Bilirubin 0.3 - 1.2 mg/dL 1.2  0.9  0.8   Alkaline Phos 38 - 126 U/L 60  61  57   AST 15 - 41 U/L 36  36  20   ALT 0 - 44 U/L 37  40  21       RADIOGRAPHIC STUDIES: I have  personally reviewed the radiological images as listed and agreed with the findings in the report. No results found.    No orders of the defined types were placed in this encounter.  All questions were answered. The patient knows to call the clinic with any problems, questions or concerns. No barriers to learning was detected. The total time spent in the appointment was 30 minutes.     Truitt Merle, MD 12/31/2021   I, Wilburn Mylar, am acting as scribe for Truitt Merle, MD.   I have reviewed the above documentation for accuracy and completeness, and I agree with the above.

## 2021-12-31 NOTE — Patient Instructions (Signed)
[facility]  Discharge Instructions: Thank you for choosing [facility] to provide your oncology and hematology care.   If you have a lab appointment with the Cancer Center, please go directly to the Cancer Center and check in at the registration area.   Wear comfortable clothing and clothing appropriate for easy access to any Portacath or PICC line.   We strive to give you quality time with your provider. You may need to reschedule your appointment if you arrive late (15 or more minutes).  Arriving late affects you and other patients whose appointments are after yours.  Also, if you miss three or more appointments without notifying the office, you may be dismissed from the clinic at the provider's discretion.      For prescription refill requests, have your pharmacy contact our office and allow 72 hours for refills to be completed.    Today you received the following chemotherapy and/or immunotherapy agents oxaliplatin      To help prevent nausea and vomiting after your treatment, we encourage you to take your nausea medication as directed.  BELOW ARE SYMPTOMS THAT SHOULD BE REPORTED IMMEDIATELY: *FEVER GREATER THAN 100.4 F (38 C) OR HIGHER *CHILLS OR SWEATING *NAUSEA AND VOMITING THAT IS NOT CONTROLLED WITH YOUR NAUSEA MEDICATION *UNUSUAL SHORTNESS OF BREATH *UNUSUAL BRUISING OR BLEEDING *URINARY PROBLEMS (pain or burning when urinating, or frequent urination) *BOWEL PROBLEMS (unusual diarrhea, constipation, pain near the anus) TENDERNESS IN MOUTH AND THROAT WITH OR WITHOUT PRESENCE OF ULCERS (sore throat, sores in mouth, or a toothache) UNUSUAL RASH, SWELLING OR PAIN  UNUSUAL VAGINAL DISCHARGE OR ITCHING   Items with * indicate a potential emergency and should be followed up as soon as possible or go to the Emergency Department if any problems should occur.  Please show the CHEMOTHERAPY ALERT CARD or IMMUNOTHERAPY ALERT CARD at check-in to  the Emergency Department and triage nurse.  Should you have questions after your visit or need to cancel or reschedule your appointment, please contact [facility]  Dept: [phone]  and follow the prompts.  Office hours are 8:00 a.m. to 4:30 p.m. Monday - Friday. Please note that voicemails left after 4:00 p.m. may not be returned until the following business day.  We are closed weekends and major holidays. You have access to a nurse at all times for urgent questions. Please call the main number to the clinic Dept: [phone] and follow the prompts.   For any non-urgent questions, you may also contact your provider using MyChart. We now offer e-Visits for anyone 18 and older to request care online for non-urgent symptoms. For details visit [email or web address].   Also download the MyChart app! Go to the app store, search "MyChart", open the app, select [facility], and log in with your MyChart username and password.  Masks are optional in the cancer centers. If you would like for your care team to wear a mask while they are taking care of you, please let them know. You may have one support person who is at least 36 years old accompany you for your appointments. 

## 2022-01-04 ENCOUNTER — Other Ambulatory Visit: Payer: Self-pay

## 2022-01-04 ENCOUNTER — Encounter: Payer: Self-pay | Admitting: Hematology

## 2022-01-04 DIAGNOSIS — C187 Malignant neoplasm of sigmoid colon: Secondary | ICD-10-CM

## 2022-01-04 NOTE — Telephone Encounter (Signed)
This nurse received a message from this patient stating that he is taking Zofran and Compazine and it is not helping his nausea.  This nurse left a message requesting for patient to give [place] a call in the clinic to find out how the patient is taking his antinausea medications.  And also to see if the patient needs to be brought in for symptom management.  No further concerns at this time.

## 2022-01-10 ENCOUNTER — Other Ambulatory Visit (HOSPITAL_COMMUNITY): Payer: Self-pay

## 2022-01-10 ENCOUNTER — Other Ambulatory Visit: Payer: Self-pay | Admitting: Hematology

## 2022-01-10 DIAGNOSIS — C187 Malignant neoplasm of sigmoid colon: Secondary | ICD-10-CM

## 2022-01-10 MED ORDER — PROCHLORPERAZINE MALEATE 10 MG PO TABS
10.0000 mg | ORAL_TABLET | Freq: Four times a day (QID) | ORAL | 1 refills | Status: DC | PRN
  Filled 2022-01-10: qty 30, 8d supply, fill #0

## 2022-01-17 ENCOUNTER — Other Ambulatory Visit (HOSPITAL_COMMUNITY): Payer: Self-pay

## 2022-01-18 MED FILL — Dexamethasone Sodium Phosphate Inj 100 MG/10ML: INTRAMUSCULAR | Qty: 1 | Status: AC

## 2022-01-21 ENCOUNTER — Inpatient Hospital Stay: Payer: BC Managed Care – PPO

## 2022-01-21 ENCOUNTER — Inpatient Hospital Stay: Payer: BC Managed Care – PPO | Admitting: Hematology

## 2022-01-21 ENCOUNTER — Encounter: Payer: Self-pay | Admitting: Hematology

## 2022-01-21 ENCOUNTER — Other Ambulatory Visit: Payer: Self-pay

## 2022-01-21 VITALS — BP 124/83 | HR 80 | Temp 97.8°F | Resp 18 | Ht 74.0 in | Wt 242.3 lb

## 2022-01-21 DIAGNOSIS — C187 Malignant neoplasm of sigmoid colon: Secondary | ICD-10-CM

## 2022-01-21 DIAGNOSIS — Z8052 Family history of malignant neoplasm of bladder: Secondary | ICD-10-CM | POA: Diagnosis not present

## 2022-01-21 DIAGNOSIS — Z800 Family history of malignant neoplasm of digestive organs: Secondary | ICD-10-CM | POA: Diagnosis not present

## 2022-01-21 DIAGNOSIS — D630 Anemia in neoplastic disease: Secondary | ICD-10-CM | POA: Diagnosis not present

## 2022-01-21 DIAGNOSIS — D500 Iron deficiency anemia secondary to blood loss (chronic): Secondary | ICD-10-CM

## 2022-01-21 DIAGNOSIS — Z5111 Encounter for antineoplastic chemotherapy: Secondary | ICD-10-CM | POA: Diagnosis not present

## 2022-01-21 LAB — CBC WITH DIFFERENTIAL/PLATELET
Abs Immature Granulocytes: 0 10*3/uL (ref 0.00–0.07)
Basophils Absolute: 0 10*3/uL (ref 0.0–0.1)
Basophils Relative: 1 %
Eosinophils Absolute: 0.2 10*3/uL (ref 0.0–0.5)
Eosinophils Relative: 5 %
HCT: 36.2 % — ABNORMAL LOW (ref 39.0–52.0)
Hemoglobin: 12.2 g/dL — ABNORMAL LOW (ref 13.0–17.0)
Immature Granulocytes: 0 %
Lymphocytes Relative: 39 %
Lymphs Abs: 1.5 10*3/uL (ref 0.7–4.0)
MCH: 28.9 pg (ref 26.0–34.0)
MCHC: 33.7 g/dL (ref 30.0–36.0)
MCV: 85.8 fL (ref 80.0–100.0)
Monocytes Absolute: 0.7 10*3/uL (ref 0.1–1.0)
Monocytes Relative: 18 %
Neutro Abs: 1.5 10*3/uL — ABNORMAL LOW (ref 1.7–7.7)
Neutrophils Relative %: 37 %
Platelets: 145 10*3/uL — ABNORMAL LOW (ref 150–400)
RBC: 4.22 MIL/uL (ref 4.22–5.81)
RDW: 21 % — ABNORMAL HIGH (ref 11.5–15.5)
WBC: 3.9 10*3/uL — ABNORMAL LOW (ref 4.0–10.5)
nRBC: 0 % (ref 0.0–0.2)

## 2022-01-21 LAB — IRON AND IRON BINDING CAPACITY (CC-WL,HP ONLY)
Iron: 118 ug/dL (ref 45–182)
Saturation Ratios: 24 % (ref 17.9–39.5)
TIBC: 498 ug/dL — ABNORMAL HIGH (ref 250–450)
UIBC: 380 ug/dL — ABNORMAL HIGH (ref 117–376)

## 2022-01-21 LAB — COMPREHENSIVE METABOLIC PANEL
ALT: 31 U/L (ref 0–44)
AST: 38 U/L (ref 15–41)
Albumin: 4.2 g/dL (ref 3.5–5.0)
Alkaline Phosphatase: 65 U/L (ref 38–126)
Anion gap: 5 (ref 5–15)
BUN: 14 mg/dL (ref 6–20)
CO2: 25 mmol/L (ref 22–32)
Calcium: 9.2 mg/dL (ref 8.9–10.3)
Chloride: 107 mmol/L (ref 98–111)
Creatinine, Ser: 0.86 mg/dL (ref 0.61–1.24)
GFR, Estimated: >60 mL/min (ref >60)
Glucose, Bld: 145 mg/dL — ABNORMAL HIGH (ref 70–99)
Potassium: 4 mmol/L (ref 3.5–5.1)
Sodium: 137 mmol/L (ref 135–145)
Total Bilirubin: 1.2 mg/dL (ref 0.3–1.2)
Total Protein: 7.6 g/dL (ref 6.5–8.1)

## 2022-01-21 LAB — FERRITIN: Ferritin: 30 ng/mL (ref 24–336)

## 2022-01-21 MED ORDER — SODIUM CHLORIDE 0.9 % IV SOLN
10.0000 mg | Freq: Once | INTRAVENOUS | Status: AC
  Administered 2022-01-21: 10 mg via INTRAVENOUS
  Filled 2022-01-21: qty 10

## 2022-01-21 MED ORDER — PALONOSETRON HCL INJECTION 0.25 MG/5ML
0.2500 mg | Freq: Once | INTRAVENOUS | Status: AC
  Administered 2022-01-21: 0.25 mg via INTRAVENOUS
  Filled 2022-01-21: qty 5

## 2022-01-21 MED ORDER — DEXTROSE 5 % IV SOLN: Freq: Once | INTRAVENOUS | Status: AC

## 2022-01-21 MED ORDER — OXALIPLATIN CHEMO INJECTION 100 MG/20ML
128.0000 mg/m2 | Freq: Once | INTRAVENOUS | Status: AC
  Administered 2022-01-21: 300 mg via INTRAVENOUS
  Filled 2022-01-21: qty 60

## 2022-01-21 NOTE — Progress Notes (Signed)
[facility]   Telephone:([phone]) [phone] [phone]   Clinic Follow up Note   Patient Care Team: Libby Maw, MD as PCP - General (Family Medicine) Berniece Salines, DO as PCP - Cardiology (Cardiology) Truitt Merle, MD as Consulting Physician (Oncology) Royston Bake, RN as Oncology Nurse Navigator (Oncology)  Date of Service:  01/21/2022  CHIEF COMPLAINT: f/u of sigmoid colon cancer  CURRENT THERAPY:  Adjuvant CAPEOX, q21d, starting 11/19/21  ASSESSMENT & PLAN:  Philip Clark is a 36 y.o. male with   1. Adenocarcinoma of sigmoid colon, G1, stage IIIB p(T3, N1a) cM0, stage IIIB, MSS -initially developed abdominal pain/cramping in 02/2021 and rectal bleeding since 05/2021. Colonoscopy 09/11/21 showed 6 cm sigmoid colon mass located 20-26 cm from anal verge. Biopsy confirmed adenocarcinoma. -staging CT 4/25 and MRI 4/27 showed suspicious mesenteric lymph nodes, no distant metastasis. -s/p surgical resection 10/17/21 with Dr. Dema Severin, path showed clear margins and one positive node -given his stage IIIB disease, he started adjuvant CAPEOX on 11/19/21. He tolerated first 2 cycles well with mild cold sensitivity, low appetite and leg pain. He has recovered well with mild fatigue. -labs reviewed, hgb improved but plt down. Adequate to proceed with cycle 4, his final cycle, today.  --I discussed the risk of cancer recurrence in the future. I discussed the surveillance plan, which is a physical exam and lab test (including CBC, CMP and CEA) every 3 months for the first 2 years, then every 6-12 months, colonoscopy in one year, and surveilliance CT scan every 6-12 month for up to 5 year.  -first surveillance scan in 02/2022, I ordered today, will see him back after CT    2. Mild Anemia, iron deficiency -secondary to #1 -hgb stable at 12.2 today (01/21/22). Iron panel is pending.     PLAN:  -proceed with final oxali today as scheduled -continue Xeloda at same dose for 2  more weeks -f/u in 2 months, with lab and CT several days before   No problem-specific Assessment & Plan notes found for this encounter.   SUMMARY OF ONCOLOGIC HISTORY: Oncology History Overview Note   Cancer Staging  Adenocarcinoma of sigmoid colon [facility]) Staging form: Colon and Rectum, AJCC 8th Edition - Pathologic stage from 10/17/2021: Stage IIIB (pT3, pN1a, cM0) - Signed by Truitt Merle, MD on 11/12/2021    Adenocarcinoma of sigmoid colon ([place])  09/11/2021 Procedure   Colonoscopy, Dr. Bryan Lemma  Impression - One 8 mm polyp in the ascending colon, removed with a cold snare. Resected and retrieved. - One 4 mm polyp in the sigmoid colon, removed with a cold snare. Resected and retrieved. - Malignant partially obstructing tumor in the sigmoid colon, located 20-26 cm from the anal verge. This was traversed. Biopsied. Tattoo placed 1-2 cm distal to the lesion. - One 2 mm polyp in the rectum, removed with a cold biopsy forceps. Resected and retrieved. - The distal rectum and anal verge are normal on retroflexion view. - The examined portion of the ileum was normal.   09/11/2021 Initial Biopsy   Diagnosis 1. Duodenum, Biopsy - DUODENAL MUCOSA WITH NORMAL VILLOUS ARCHITECTURE. - NO VILLOUS ATROPHY OR INCREASED INTRAEPITHELIAL LYMPHOCYTES. 2. Duodenum, Biopsy, bulb - DUODENAL BULB WITH PEPTIC INJURY. 3. Ascending Colon Polyp - MULTIPLE FRAGMENTS OF TUBULAR ADENOMA WITHOUT HIGH GRADE DYSPLASIA. 4. Sigmoid Colon Polyp - INVASIVE COLONIC ADENOCARCINOMA. - SEE MICROSCOPIC DESCRIPTION. 5. Colon, polyp(s), mass - INVASIVE COLONIC ADENOCARCINOMA. - SEE MICROSCOPIC DESCRIPTION. 6. Rectum, polyp(s) - HYPERPLASTIC POLYP. Microscopic Comment 4.  and 5. The carcinoma in parts 4 and 5 have identical morphologic features.   09/18/2021 Imaging   EXAM: CT CHEST, ABDOMEN, AND PELVIS WITH CONTRAST  IMPRESSION: 1. Short segment asymmetric wall thickening of the sigmoid colon is consistent with  patient's known sigmoid colon cancer. 2. Prominent lymph nodes in the sigmoid mesentery measure up to 6 mm in short axis, which likely reflect local nodal disease involvement. 3. No evidence of distant metastatic disease in the chest, abdomen or pelvis. 4. No evidence of high-grade bowel obstruction. However, there is a large volume of formed stool throughout the colon   09/20/2021 Imaging   EXAM: MRI PELVIS WITHOUT AND WITH CONTRAST  IMPRESSION: 1. Partially obstructing malignancy of the mid sigmoid colon with prominent sigmoid mesenteric lymph nodes suspicious for early nodal metastases. 2. No evidence bowel obstruction, perforation, ascites or peritoneal nodularity. 3. Left paracentral disc protrusion at L5-S1 with resulting left S1 nerve root encroachment.   10/02/2021 Initial Diagnosis   Adenocarcinoma of sigmoid colon ([place])   10/09/2021 Genetic Testing   [facility] CancerNext-Expanded Panel was Negative. Report date is 10/26/2021.  The CancerNext-Expanded gene panel offered by [facility] and includes sequencing, rearrangement, and RNA analysis for the following 77 genes: AIP, ALK, APC, ATM, AXIN2, BAP1, BARD1, BLM, BMPR1A, BRCA1, BRCA2, BRIP1, CDC73, CDH1, CDK4, CDKN1B, CDKN2A, CHEK2, CTNNA1, DICER1, FANCC, FH, FLCN, GALNT12, KIF1B, LZTR1, MAX, MEN1, MET, MLH1, MSH2, MSH3, MSH6, MUTYH, NBN, NF1, NF2, NTHL1, PALB2, PHOX2B, PMS2, POT1, PRKAR1A, PTCH1, PTEN, RAD51C, RAD51D, RB1, RECQL, RET, SDHA, SDHAF2, SDHB, SDHC, SDHD, SMAD4, SMARCA4, SMARCB1, SMARCE1, STK11, SUFU, TMEM127, TP53, TSC1, TSC2, VHL and XRCC2 (sequencing and deletion/duplication); EGFR, EGLN1, HOXB13, KIT, MITF, PDGFRA, POLD1, and POLE (sequencing only); EPCAM and GREM1 (deletion/duplication only).    10/17/2021 Definitive Surgery   FINAL MICROSCOPIC DIAGNOSIS:   A. COLON, RECTOSIGMOID, RESECTION:  -  Invasive well differentiated adenocarcinoma (6 cm in greatest  dimension) with extension through submucosa, muscularis  propria and mesenteric/subserosal adipose tissue with abutment but not definitive involvement of the serosal membrane.  -  Focal evidence of precursor lesion (i.e. tubular adenoma)  -  Margins negative (proximal 5 cm, distal 7 cm and mesenteric 6 cm)  -  Negative for lymphovascular and perineural invasion.  -  Focal high-grade tumor budding present.  -  1 of 22 lymph nodes positive for malignancy  pT3 pN1 pM n/a   B. FINAL DISTAL MARGIN:  -   Unremarkable colonic mucosa, negative for malignancy.    ADDENDUM:  Mismatch Repair Protein (IHC)  SUMMARY INTERPRETATION: NORMAL   10/17/2021 Cancer Staging   Staging form: Colon and Rectum, AJCC 8th Edition - Pathologic stage from 10/17/2021: Stage IIIB (pT3, pN1a, cM0) - Signed by Truitt Merle, MD on 11/12/2021 Total positive nodes: 1 Histologic grading system: 4 grade system Histologic grade (G): G2 Residual tumor (R): R0 - None   11/19/2021 -  Chemotherapy   Patient is on Treatment Plan : COLORECTAL Xelox (Capeox) q21d        INTERVAL HISTORY:  Philip Clark is here for a follow up of colon cancer. He was last seen by me on 12/31/21. He presents to the clinic accompanied by his wife. He reports he has recovered well from last cycle. He reports he had cold sensitivity and experienced tingling in his front teeth.   All other systems were reviewed with the patient and are negative.  MEDICAL HISTORY:  Past Medical History:  Diagnosis Date   Allergy    Anemia  Anxiety    Bronchitis    Cancer ([place])    Depression     SURGICAL HISTORY: Past Surgical History:  Procedure Laterality Date   ANTERIOR CRUCIATE LIGAMENT (ACL) REVISION Left    FLEXIBLE SIGMOIDOSCOPY N/A 10/17/2021   Procedure: FLEXIBLE SIGMOIDOSCOPY, INTRAOPERATIVE ASSESSMENT OF PERFUSION;  Surgeon: Ileana Roup, MD;  Location: [facility];  Service: General;  Laterality: N/A;   XI ROBOTIC ASSISTED LOWER ANTERIOR RESECTION N/A 10/17/2021   Procedure: XI ROBOTIC ASSISTED  LOWER ANTERIOR RESECTION;  Surgeon: Ileana Roup, MD;  Location: [facility];  Service: General;  Laterality: N/A;  GEN AND TAP BLOCK    I have reviewed the social history and family history with the patient and they are unchanged from previous note.  ALLERGIES:  has No Known Allergies.  MEDICATIONS:  Current Outpatient Medications  Medication Sig Dispense Refill   Acetaminophen (TYLENOL PO) Take 1,250 mg by mouth every 8 (eight) hours as needed.     Ascorbic Acid (VITAMIN C) 100 MG tablet Take 100 mg by mouth daily.     capecitabine (XELODA) 500 MG tablet Take 4 tablets (2,000 mg total) by mouth 2 (two) times daily after a meal. Take for 14 days on, 7 days off. Repeat every 21 days. 112 tablet 1   cholecalciferol (VITAMIN D3) 25 MCG (1000 UNIT) tablet Take 1,000 Units by mouth daily.     ferrous sulfate 325 (65 FE) MG tablet Take 325 mg by mouth daily with breakfast.     ibuprofen (ADVIL) 800 MG tablet Take 800 mg by mouth every 8 (eight) hours as needed.     Multiple Vitamin (MULTIVITAMIN WITH MINERALS) TABS tablet Take 1 tablet by mouth daily.     Omega-3 Fatty Acids (FISH OIL PO) Take 1 capsule by mouth daily.     ondansetron (ZOFRAN) 8 MG tablet Take 1 tablet (8 mg total) by mouth 2 (two) times daily as needed for refractory nausea / vomiting. Start on day 3 after chemotherapy. 30 tablet 1   prochlorperazine (COMPAZINE) 10 MG tablet Take 1 tablet (10 mg total) by mouth every 6 (six) hours as needed (Nausea or vomiting). 30 tablet 1   zinc gluconate 50 MG tablet Take 50 mg by mouth daily.     No current facility-administered medications for this visit.   Facility-Administered Medications Ordered in Other Visits  Medication Dose Route Frequency Provider Last Rate Last Admin   oxaliplatin (ELOXATIN) 300 mg in dextrose 5 % 500 mL chemo infusion  128 mg/m2 (Treatment Plan Recorded) Intravenous Once Truitt Merle, MD        PHYSICAL EXAMINATION: ECOG PERFORMANCE STATUS: 1 - Symptomatic  but completely ambulatory  Vitals:   01/21/22 0914  BP: 124/83  Pulse: 80  Resp: 18  Temp: 97.8 F (36.6 C)  SpO2: 100%   Wt Readings from Last 3 Encounters:  01/21/22 242 lb 4.8 oz (109.9 kg)  12/31/21 247 lb 8 oz (112.3 kg)  12/10/21 246 lb 8 oz (111.8 kg)     GENERAL:alert, no distress and comfortable SKIN: skin color normal, no rashes or significant lesions EYES: normal, Conjunctiva are pink and non-injected, sclera clear  NEURO: alert & oriented x 3 with fluent speech  LABORATORY DATA:  I have reviewed the data as listed    Latest Ref Rng & Units 01/21/2022    8:58 AM 12/31/2021    9:06 AM 12/10/2021    8:09 AM  CBC  WBC 4.0 - 10.5 K/uL 3.9  4.1  5.4   Hemoglobin 13.0 - 17.0 g/dL 12.2  12.4  12.1   Hematocrit 39.0 - 52.0 % 36.2  37.1  37.0   Platelets 150 - 400 K/uL 145  140  311         Latest Ref Rng & Units 01/21/2022    8:58 AM 12/31/2021    9:06 AM 12/10/2021    8:09 AM  CMP  Glucose 70 - 99 mg/dL 145  114  99   BUN 6 - 20 mg/dL 14  14  16    Creatinine 0.61 - 1.24 mg/dL 0.86  1.12  1.10   Sodium 135 - 145 mmol/L 137  136  139   Potassium 3.5 - 5.1 mmol/L 4.0  3.9  3.6   Chloride 98 - 111 mmol/L 107  105  108   CO2 22 - 32 mmol/L 25  26  25    Calcium 8.9 - 10.3 mg/dL 9.2  9.1  9.2   Total Protein 6.5 - 8.1 g/dL 7.6  7.8  7.8   Total Bilirubin 0.3 - 1.2 mg/dL 1.2  1.2  0.9   Alkaline Phos 38 - 126 U/L 65  60  61   AST 15 - 41 U/L 38  36  36   ALT 0 - 44 U/L 31  37  40       RADIOGRAPHIC STUDIES: I have personally reviewed the radiological images as listed and agreed with the findings in the report. No results found.    Orders Placed This Encounter  Procedures   CT CHEST ABDOMEN PELVIS W CONTRAST    Standing Status:   Future    Standing Expiration Date:   01/22/2023    Order Specific Question:   Preferred imaging location?    Answer:   [facility]    Order Specific Question:   Is Oral Contrast requested for this exam?    Answer:   Yes,  Per Radiology protocol   All questions were answered. The patient knows to call the clinic with any problems, questions or concerns. No barriers to learning was detected. The total time spent in the appointment was 30 minutes.     Truitt Merle, MD 01/21/2022   I, Wilburn Mylar, am acting as scribe for Truitt Merle, MD.   I have reviewed the above documentation for accuracy and completeness, and I agree with the above.

## 2022-01-22 ENCOUNTER — Other Ambulatory Visit: Payer: Self-pay

## 2022-01-23 ENCOUNTER — Telehealth: Payer: Self-pay | Admitting: Hematology

## 2022-01-23 NOTE — Telephone Encounter (Signed)
Scheduled follow-up appointments per 8/28 los. Patient is aware.

## 2022-01-24 ENCOUNTER — Other Ambulatory Visit: Payer: Self-pay

## 2022-02-05 ENCOUNTER — Other Ambulatory Visit (HOSPITAL_COMMUNITY): Payer: Self-pay

## 2022-02-07 ENCOUNTER — Other Ambulatory Visit (HOSPITAL_COMMUNITY): Payer: Self-pay

## 2022-02-19 ENCOUNTER — Encounter: Payer: Self-pay | Admitting: Hematology

## 2022-02-22 ENCOUNTER — Telehealth: Payer: Self-pay

## 2022-02-22 NOTE — Telephone Encounter (Signed)
Called spouse to confirm address. Return to work letter mailed to home address.

## 2022-03-06 ENCOUNTER — Other Ambulatory Visit: Payer: Self-pay

## 2022-03-11 ENCOUNTER — Other Ambulatory Visit: Payer: Self-pay

## 2022-03-18 ENCOUNTER — Inpatient Hospital Stay: Payer: BC Managed Care – PPO | Attending: Genetic Counselor

## 2022-03-18 ENCOUNTER — Telehealth: Payer: Self-pay

## 2022-03-18 DIAGNOSIS — C187 Malignant neoplasm of sigmoid colon: Secondary | ICD-10-CM | POA: Insufficient documentation

## 2022-03-18 DIAGNOSIS — D500 Iron deficiency anemia secondary to blood loss (chronic): Secondary | ICD-10-CM

## 2022-03-18 LAB — CBC WITH DIFFERENTIAL/PLATELET
Abs Immature Granulocytes: 0.01 10*3/uL (ref 0.00–0.07)
Basophils Absolute: 0.1 10*3/uL (ref 0.0–0.1)
Basophils Relative: 1 %
Eosinophils Absolute: 0.4 10*3/uL (ref 0.0–0.5)
Eosinophils Relative: 7 %
HCT: 38.9 % — ABNORMAL LOW (ref 39.0–52.0)
Hemoglobin: 13.2 g/dL (ref 13.0–17.0)
Immature Granulocytes: 0 %
Lymphocytes Relative: 32 %
Lymphs Abs: 2 10*3/uL (ref 0.7–4.0)
MCH: 30.9 pg (ref 26.0–34.0)
MCHC: 33.9 g/dL (ref 30.0–36.0)
MCV: 91.1 fL (ref 80.0–100.0)
Monocytes Absolute: 0.7 10*3/uL (ref 0.1–1.0)
Monocytes Relative: 12 %
Neutro Abs: 3 10*3/uL (ref 1.7–7.7)
Neutrophils Relative %: 48 %
Platelets: 211 10*3/uL (ref 150–400)
RBC: 4.27 MIL/uL (ref 4.22–5.81)
RDW: 16.9 % — ABNORMAL HIGH (ref 11.5–15.5)
WBC: 6.1 10*3/uL (ref 4.0–10.5)
nRBC: 0 % (ref 0.0–0.2)

## 2022-03-18 LAB — COMPREHENSIVE METABOLIC PANEL
ALT: 36 U/L (ref 0–44)
AST: 35 U/L (ref 15–41)
Albumin: 4.1 g/dL (ref 3.5–5.0)
Alkaline Phosphatase: 62 U/L (ref 38–126)
Anion gap: 4 — ABNORMAL LOW (ref 5–15)
BUN: 17 mg/dL (ref 6–20)
CO2: 27 mmol/L (ref 22–32)
Calcium: 9.2 mg/dL (ref 8.9–10.3)
Chloride: 106 mmol/L (ref 98–111)
Creatinine, Ser: 1.06 mg/dL (ref 0.61–1.24)
GFR, Estimated: >60 mL/min (ref >60)
Glucose, Bld: 107 mg/dL — ABNORMAL HIGH (ref 70–99)
Potassium: 4 mmol/L (ref 3.5–5.1)
Sodium: 137 mmol/L (ref 135–145)
Total Bilirubin: 1.5 mg/dL — ABNORMAL HIGH (ref 0.3–1.2)
Total Protein: 7.8 g/dL (ref 6.5–8.1)

## 2022-03-18 LAB — IRON AND IRON BINDING CAPACITY (CC-WL,HP ONLY)
Iron: 142 ug/dL (ref 45–182)
Saturation Ratios: 35 % (ref 17.9–39.5)
TIBC: 410 ug/dL (ref 250–450)
UIBC: 268 ug/dL (ref 117–376)

## 2022-03-18 LAB — FERRITIN: Ferritin: 14 ng/mL — ABNORMAL LOW (ref 24–336)

## 2022-03-18 LAB — CEA (IN HOUSE-CHCC): CEA (CHCC-In House): 2.83 ng/mL (ref 0.00–5.00)

## 2022-03-18 NOTE — Telephone Encounter (Signed)
Called patient to give Central Scheduling # [phone] to schedule CT scan for this week. Also made him aware that his appointment with Dr. Burr Medico will be rescheduled as well for next week since the CT scan is needed first. Patient verbalized understanding.

## 2022-03-19 ENCOUNTER — Other Ambulatory Visit: Payer: Self-pay

## 2022-03-21 ENCOUNTER — Ambulatory Visit: Payer: BC Managed Care – PPO | Admitting: Hematology

## 2022-03-22 ENCOUNTER — Ambulatory Visit (HOSPITAL_COMMUNITY)
Admission: RE | Admit: 2022-03-22 | Discharge: 2022-03-22 | Disposition: A | Discharge status: Home / Self Care | Payer: BC Managed Care – PPO | Source: Ambulatory Visit | Attending: Hematology | Admitting: Hematology

## 2022-03-22 DIAGNOSIS — N289 Disorder of kidney and ureter, unspecified: Secondary | ICD-10-CM | POA: Diagnosis not present

## 2022-03-22 DIAGNOSIS — Z85038 Personal history of other malignant neoplasm of large intestine: Secondary | ICD-10-CM | POA: Diagnosis not present

## 2022-03-22 DIAGNOSIS — C187 Malignant neoplasm of sigmoid colon: Secondary | ICD-10-CM | POA: Insufficient documentation

## 2022-03-22 MED ORDER — IOHEXOL 9 MG/ML PO SOLN
ORAL | Status: AC
  Filled 2022-03-22: qty 1000

## 2022-03-22 MED ORDER — IOHEXOL 300 MG/ML  SOLN
100.0000 mL | Freq: Once | INTRAMUSCULAR | Status: AC | PRN
  Administered 2022-03-22: 100 mL via INTRAVENOUS

## 2022-03-22 MED ORDER — SODIUM CHLORIDE (PF) 0.9 % IJ SOLN
INTRAMUSCULAR | Status: AC
  Filled 2022-03-22: qty 50

## 2022-03-28 ENCOUNTER — Encounter: Payer: Self-pay | Admitting: Hematology

## 2022-03-28 ENCOUNTER — Inpatient Hospital Stay: Payer: BC Managed Care – PPO | Attending: Genetic Counselor | Admitting: Hematology

## 2022-03-28 VITALS — BP 128/79 | HR 87 | Temp 98.3°F | Resp 16 | Ht 74.0 in | Wt 259.4 lb

## 2022-03-28 DIAGNOSIS — Z800 Family history of malignant neoplasm of digestive organs: Secondary | ICD-10-CM | POA: Insufficient documentation

## 2022-03-28 DIAGNOSIS — Z8052 Family history of malignant neoplasm of bladder: Secondary | ICD-10-CM | POA: Insufficient documentation

## 2022-03-28 DIAGNOSIS — C187 Malignant neoplasm of sigmoid colon: Secondary | ICD-10-CM | POA: Insufficient documentation

## 2022-03-28 DIAGNOSIS — D630 Anemia in neoplastic disease: Secondary | ICD-10-CM | POA: Insufficient documentation

## 2022-03-28 NOTE — Progress Notes (Signed)
Philip Clark   [phone] [phone]   Clinic Follow up Note   Patient Care Team: Libby Maw, MD as PCP - General (Family Medicine) Berniece Salines, DO as PCP - Cardiology (Cardiology) Truitt Merle, MD as Consulting Physician (Oncology)  Date of Service:  03/28/2022  CHIEF COMPLAINT: f/u of colon cancer  CURRENT THERAPY:  Surveillance  ASSESSMENT & PLAN:  Philip Clark is a 36 y.o. male with   1. Adenocarcinoma of sigmoid colon, G1, stage IIIB p(T3, N1a) cM0, stage IIIB, MSS -initially developed abdominal pain/cramping in 02/2021 and rectal bleeding since 05/2021. Colonoscopy 09/11/21 showed 6 cm sigmoid colon mass located 20-26 cm from anal verge. Biopsy confirmed adenocarcinoma. -staging CT 4/25 and MRI 4/27 showed suspicious mesenteric lymph nodes, no distant metastasis. -s/p surgical resection 10/17/21 with Dr. Dema Severin, path showed clear margins and one positive node -baseline CEA 11/13/21 was WNL. -given his stage IIIB disease, he completed 4 cycles adjuvant CAPEOX 11/19/21 - 02/03/22. He tolerated relatively well overall. -post-treatment CT CAP 03/22/22 showed NED. -he is clinically doing very well with minimal remaining side effects. Labs from 10/23 reviewed, improving off chemo and nearly WNL except ferritin. CEA remains WNL. He tells me he is due to see Dr. Dema Severin soon. -he will be due for surveillance colonoscopy in 08/2022; we will see him back before this.   2. Mild Anemia, iron deficiency -secondary to #1 -improving off chemo. Labs from 03/18/22 showed hgb and iron WNL but ferritin low at 14. I recommended he restart oral iron for a few months      PLAN:  -lab and f/u with PA Cassie in 3 months, plan to repeat CT CAP in 6 months    No problem-specific Assessment & Plan notes found for this encounter.   SUMMARY OF ONCOLOGIC HISTORY: Oncology History Overview Note   Cancer Staging  Adenocarcinoma of sigmoid colon [facility]) Staging  form: Colon and Rectum, AJCC 8th Edition - Pathologic stage from 10/17/2021: Stage IIIB (pT3, pN1a, cM0) - Signed by Truitt Merle, MD on 11/12/2021    Adenocarcinoma of sigmoid colon ([place])  09/11/2021 Procedure   Colonoscopy, Dr. Bryan Lemma  Impression - One 8 mm polyp in the ascending colon, removed with a cold snare. Resected and retrieved. - One 4 mm polyp in the sigmoid colon, removed with a cold snare. Resected and retrieved. - Malignant partially obstructing tumor in the sigmoid colon, located 20-26 cm from the anal verge. This was traversed. Biopsied. Tattoo placed 1-2 cm distal to the lesion. - One 2 mm polyp in the rectum, removed with a cold biopsy forceps. Resected and retrieved. - The distal rectum and anal verge are normal on retroflexion view. - The examined portion of the ileum was normal.   09/11/2021 Initial Biopsy   Diagnosis 1. Duodenum, Biopsy - DUODENAL MUCOSA WITH NORMAL VILLOUS ARCHITECTURE. - NO VILLOUS ATROPHY OR INCREASED INTRAEPITHELIAL LYMPHOCYTES. 2. Duodenum, Biopsy, bulb - DUODENAL BULB WITH PEPTIC INJURY. 3. Ascending Colon Polyp - MULTIPLE FRAGMENTS OF TUBULAR ADENOMA WITHOUT HIGH GRADE DYSPLASIA. 4. Sigmoid Colon Polyp - INVASIVE COLONIC ADENOCARCINOMA. - SEE MICROSCOPIC DESCRIPTION. 5. Colon, polyp(s), mass - INVASIVE COLONIC ADENOCARCINOMA. - SEE MICROSCOPIC DESCRIPTION. 6. Rectum, polyp(s) - HYPERPLASTIC POLYP. Microscopic Comment 4. and 5. The carcinoma in parts 4 and 5 have identical morphologic features.   09/18/2021 Imaging   EXAM: CT CHEST, ABDOMEN, AND PELVIS WITH CONTRAST  IMPRESSION: 1. Short segment asymmetric wall thickening of the sigmoid colon is consistent with patient's known sigmoid colon  cancer. 2. Prominent lymph nodes in the sigmoid mesentery measure up to 6 mm in short axis, which likely reflect local nodal disease involvement. 3. No evidence of distant metastatic disease in the chest, abdomen or pelvis. 4. No evidence of  high-grade bowel obstruction. However, there is a large volume of formed stool throughout the colon   09/20/2021 Imaging   EXAM: MRI PELVIS WITHOUT AND WITH CONTRAST  IMPRESSION: 1. Partially obstructing malignancy of the mid sigmoid colon with prominent sigmoid mesenteric lymph nodes suspicious for early nodal metastases. 2. No evidence bowel obstruction, perforation, ascites or peritoneal nodularity. 3. Left paracentral disc protrusion at L5-S1 with resulting left S1 nerve root encroachment.   10/02/2021 Initial Diagnosis   Adenocarcinoma of sigmoid colon ([place])   10/09/2021 Genetic Testing   [facility] CancerNext-Expanded Panel was Negative. Report date is 10/26/2021.  The CancerNext-Expanded gene panel offered by [facility] and includes sequencing, rearrangement, and RNA analysis for the following 77 genes: AIP, ALK, APC, ATM, AXIN2, BAP1, BARD1, BLM, BMPR1A, BRCA1, BRCA2, BRIP1, CDC73, CDH1, CDK4, CDKN1B, CDKN2A, CHEK2, CTNNA1, DICER1, FANCC, FH, FLCN, GALNT12, KIF1B, LZTR1, MAX, MEN1, MET, MLH1, MSH2, MSH3, MSH6, MUTYH, NBN, NF1, NF2, NTHL1, PALB2, PHOX2B, PMS2, POT1, PRKAR1A, PTCH1, PTEN, RAD51C, RAD51D, RB1, RECQL, RET, SDHA, SDHAF2, SDHB, SDHC, SDHD, SMAD4, SMARCA4, SMARCB1, SMARCE1, STK11, SUFU, TMEM127, TP53, TSC1, TSC2, VHL and XRCC2 (sequencing and deletion/duplication); EGFR, EGLN1, HOXB13, KIT, MITF, PDGFRA, POLD1, and POLE (sequencing only); EPCAM and GREM1 (deletion/duplication only).    10/17/2021 Definitive Surgery   FINAL MICROSCOPIC DIAGNOSIS:   A. COLON, RECTOSIGMOID, RESECTION:  -  Invasive well differentiated adenocarcinoma (6 cm in greatest  dimension) with extension through submucosa, muscularis propria and mesenteric/subserosal adipose tissue with abutment but not definitive involvement of the serosal membrane.  -  Focal evidence of precursor lesion (i.e. tubular adenoma)  -  Margins negative (proximal 5 cm, distal 7 cm and mesenteric 6 cm)  -  Negative for  lymphovascular and perineural invasion.  -  Focal high-grade tumor budding present.  -  1 of 22 lymph nodes positive for malignancy  pT3 pN1 pM n/a   B. FINAL DISTAL MARGIN:  -   Unremarkable colonic mucosa, negative for malignancy.    ADDENDUM:  Mismatch Repair Protein ([facility])  SUMMARY INTERPRETATION: NORMAL   10/17/2021 Cancer Staging   Staging form: Colon and Rectum, AJCC 8th Edition - Pathologic stage from 10/17/2021: Stage IIIB (pT3, pN1a, cM0) - Signed by Truitt Merle, MD on 11/12/2021 Total positive nodes: 1 Histologic grading system: 4 grade system Histologic grade (G): G2 Residual tumor (R): R0 - None   11/19/2021 -  Chemotherapy   Patient is on Treatment Plan : COLORECTAL Xelox (Capeox) q21d        INTERVAL HISTORY:  Philip Clark is here for a follow up of colon cancer. He was last seen by me on 01/21/22. He presents to the clinic alone. He reports he is recovering well from chemo. He notes some residual numbness to his fingers. He denies any bowel issues or other side effects. He does note some concern for an abdominal hernia, which he adds he plans to discuss with Dr. Dema Severin.   All other systems were reviewed with the patient and are negative.  MEDICAL HISTORY:  Past Medical History:  Diagnosis Date   Allergy    Anemia    Anxiety    Bronchitis    Cancer ([place])    Depression     SURGICAL HISTORY: Past Surgical History:  Procedure Laterality  Date   ANTERIOR CRUCIATE LIGAMENT (ACL) REVISION Left    FLEXIBLE SIGMOIDOSCOPY N/A 10/17/2021   Procedure: FLEXIBLE SIGMOIDOSCOPY, INTRAOPERATIVE ASSESSMENT OF PERFUSION;  Surgeon: Ileana Roup, MD;  Location: [facility];  Service: General;  Laterality: N/A;   XI ROBOTIC ASSISTED LOWER ANTERIOR RESECTION N/A 10/17/2021   Procedure: XI ROBOTIC ASSISTED LOWER ANTERIOR RESECTION;  Surgeon: Ileana Roup, MD;  Location: [facility];  Service: General;  Laterality: N/A;  GEN AND TAP BLOCK    I have reviewed the social history  and family history with the patient and they are unchanged from previous note.  ALLERGIES:  has No Known Allergies.  MEDICATIONS:  Current Outpatient Medications  Medication Sig Dispense Refill   Acetaminophen (TYLENOL PO) Take 1,250 mg by mouth every 8 (eight) hours as needed.     Ascorbic Acid (VITAMIN C) 100 MG tablet Take 100 mg by mouth daily.     capecitabine (XELODA) 500 MG tablet Take 4 tablets (2,000 mg total) by mouth 2 (two) times daily after a meal. Take for 14 days on, 7 days off. Repeat every 21 days. 112 tablet 1   cholecalciferol (VITAMIN D3) 25 MCG (1000 UNIT) tablet Take 1,000 Units by mouth daily.     ferrous sulfate 325 (65 FE) MG tablet Take 325 mg by mouth daily with breakfast.     ibuprofen (ADVIL) 800 MG tablet Take 800 mg by mouth every 8 (eight) hours as needed.     Multiple Vitamin (MULTIVITAMIN WITH MINERALS) TABS tablet Take 1 tablet by mouth daily.     Omega-3 Fatty Acids (FISH OIL PO) Take 1 capsule by mouth daily.     ondansetron (ZOFRAN) 8 MG tablet Take 1 tablet (8 mg total) by mouth 2 (two) times daily as needed for refractory nausea / vomiting. Start on day 3 after chemotherapy. 30 tablet 1   prochlorperazine (COMPAZINE) 10 MG tablet Take 1 tablet (10 mg total) by mouth every 6 (six) hours as needed (Nausea or vomiting). 30 tablet 1   zinc gluconate 50 MG tablet Take 50 mg by mouth daily.     No current facility-administered medications for this visit.    PHYSICAL EXAMINATION: ECOG PERFORMANCE STATUS: 0 - Asymptomatic  Vitals:   03/28/22 1355  BP: 128/79  Pulse: 87  Resp: 16  Temp: 98.3 F (36.8 C)  SpO2: 100%   Wt Readings from Last 3 Encounters:  03/28/22 259 lb 6.4 oz (117.7 kg)  01/21/22 242 lb 4.8 oz (109.9 kg)  12/31/21 247 lb 8 oz (112.3 kg)     GENERAL:alert, no distress and comfortable SKIN: skin color normal, no rashes or significant lesions EYES: normal, Conjunctiva are pink and non-injected, sclera clear  NEURO: alert &  oriented x 3 with fluent speech  LABORATORY DATA:  I have reviewed the data as listed    Latest Ref Rng & Units 03/18/2022    9:08 AM 01/21/2022    8:58 AM 12/31/2021    9:06 AM  CBC  WBC 4.0 - 10.5 K/uL 6.1  3.9  4.1   Hemoglobin 13.0 - 17.0 g/dL 13.2  12.2  12.4   Hematocrit 39.0 - 52.0 % 38.9  36.2  37.1   Platelets 150 - 400 K/uL 211  145  140         Latest Ref Rng & Units 03/18/2022    9:08 AM 01/21/2022    8:58 AM 12/31/2021    9:06 AM  CMP  Glucose 70 - 99 mg/dL  107  145  114   BUN 6 - 20 mg/dL _0 Creatinine 0.61 - 1.24 mg/dL 1.06  0.86  1.12   Sodium 135 - 145 mmol/L 137  137  136   Potassium 3.5 - 5.1 mmol/L 4.0  4.0  3.9   Chloride 98 - 111 mmol/L 106  107  105   CO2 22 - 32 mmol/L _1 Calcium 8.9 - 10.3 mg/dL 9.2  9.2  9.1   Total Protein 6.5 - 8.1 g/dL 7.8  7.6  7.8   Total Bilirubin 0.3 - 1.2 mg/dL 1.5  1.2  1.2   Alkaline Phos 38 - 126 U/L 62  65  60   AST 15 - 41 U/L 35  38  36   ALT 0 - 44 U/L 36  31  37       RADIOGRAPHIC STUDIES: I have personally reviewed the radiological images as listed and agreed with the findings in the report. No results found.    No orders of the defined types were placed in this encounter.  All questions were answered. The patient knows to call the clinic with any problems, questions or concerns. No barriers to learning was detected. The total time spent in the appointment was 25 minutes.     Truitt Merle, MD 03/28/2022   I, Wilburn Mylar, am acting as scribe for Truitt Merle, MD.   I have reviewed the above documentation for accuracy and completeness, and I agree with the above.

## 2022-03-29 ENCOUNTER — Other Ambulatory Visit: Payer: Self-pay

## 2022-03-31 ENCOUNTER — Other Ambulatory Visit: Payer: Self-pay

## 2022-06-21 ENCOUNTER — Encounter: Payer: Self-pay | Admitting: Hematology

## 2022-06-27 NOTE — Progress Notes (Signed)
[place] OFFICE PROGRESS NOTE  Libby Maw, MD [place] [address]  DIAGNOSIS: f/u of colon cancer   Oncology History Overview Note   Cancer Staging  Adenocarcinoma of sigmoid colon [facility]) Staging form: Colon and Rectum, AJCC 8th Edition - Pathologic stage from 10/17/2021: Stage IIIB (pT3, pN1a, cM0) - Signed by Truitt Merle, MD on 11/12/2021    Adenocarcinoma of sigmoid colon ([place])  09/11/2021 Procedure   Colonoscopy, Dr. Bryan Lemma  Impression - One 8 mm polyp in the ascending colon, removed with a cold snare. Resected and retrieved. - One 4 mm polyp in the sigmoid colon, removed with a cold snare. Resected and retrieved. - Malignant partially obstructing tumor in the sigmoid colon, located 20-26 cm from the anal verge. This was traversed. Biopsied. Tattoo placed 1-2 cm distal to the lesion. - One 2 mm polyp in the rectum, removed with a cold biopsy forceps. Resected and retrieved. - The distal rectum and anal verge are normal on retroflexion view. - The examined portion of the ileum was normal.   09/11/2021 Initial Biopsy   Diagnosis 1. Duodenum, Biopsy - DUODENAL MUCOSA WITH NORMAL VILLOUS ARCHITECTURE. - NO VILLOUS ATROPHY OR INCREASED INTRAEPITHELIAL LYMPHOCYTES. 2. Duodenum, Biopsy, bulb - DUODENAL BULB WITH PEPTIC INJURY. 3. Ascending Colon Polyp - MULTIPLE FRAGMENTS OF TUBULAR ADENOMA WITHOUT HIGH GRADE DYSPLASIA. 4. Sigmoid Colon Polyp - INVASIVE COLONIC ADENOCARCINOMA. - SEE MICROSCOPIC DESCRIPTION. 5. Colon, polyp(s), mass - INVASIVE COLONIC ADENOCARCINOMA. - SEE MICROSCOPIC DESCRIPTION. 6. Rectum, polyp(s) - HYPERPLASTIC POLYP. Microscopic Comment 4. and 5. The carcinoma in parts 4 and 5 have identical morphologic features.   09/18/2021 Imaging   EXAM: CT CHEST, ABDOMEN, AND PELVIS WITH CONTRAST  IMPRESSION: 1. Short segment asymmetric wall thickening of the sigmoid colon is consistent with patient's known  sigmoid colon cancer. 2. Prominent lymph nodes in the sigmoid mesentery measure up to 6 mm in short axis, which likely reflect local nodal disease involvement. 3. No evidence of distant metastatic disease in the chest, abdomen or pelvis. 4. No evidence of high-grade bowel obstruction. However, there is a large volume of formed stool throughout the colon   09/20/2021 Imaging   EXAM: MRI PELVIS WITHOUT AND WITH CONTRAST  IMPRESSION: 1. Partially obstructing malignancy of the mid sigmoid colon with prominent sigmoid mesenteric lymph nodes suspicious for early nodal metastases. 2. No evidence bowel obstruction, perforation, ascites or peritoneal nodularity. 3. Left paracentral disc protrusion at L5-S1 with resulting left S1 nerve root encroachment.   10/02/2021 Initial Diagnosis   Adenocarcinoma of sigmoid colon ([place])   10/09/2021 Genetic Testing   [facility] CancerNext-Expanded Panel was Negative. Report date is 10/26/2021.  The CancerNext-Expanded gene panel offered by [facility] and includes sequencing, rearrangement, and RNA analysis for the following 77 genes: AIP, ALK, APC, ATM, AXIN2, BAP1, BARD1, BLM, BMPR1A, BRCA1, BRCA2, BRIP1, CDC73, CDH1, CDK4, CDKN1B, CDKN2A, CHEK2, CTNNA1, DICER1, FANCC, FH, FLCN, GALNT12, KIF1B, LZTR1, MAX, MEN1, MET, MLH1, MSH2, MSH3, MSH6, MUTYH, NBN, NF1, NF2, NTHL1, PALB2, PHOX2B, PMS2, POT1, PRKAR1A, PTCH1, PTEN, RAD51C, RAD51D, RB1, RECQL, RET, SDHA, SDHAF2, SDHB, SDHC, SDHD, SMAD4, SMARCA4, SMARCB1, SMARCE1, STK11, SUFU, TMEM127, TP53, TSC1, TSC2, VHL and XRCC2 (sequencing and deletion/duplication); EGFR, EGLN1, HOXB13, KIT, MITF, PDGFRA, POLD1, and POLE (sequencing only); EPCAM and GREM1 (deletion/duplication only).    10/17/2021 Definitive Surgery   FINAL MICROSCOPIC DIAGNOSIS:   A. COLON, RECTOSIGMOID, RESECTION:  -  Invasive well differentiated adenocarcinoma (6 cm in greatest  dimension) with extension through submucosa,  muscularis propria and  mesenteric/subserosal adipose tissue with abutment but not definitive involvement of the serosal membrane.  -  Focal evidence of precursor lesion (i.e. tubular adenoma)  -  Margins negative (proximal 5 cm, distal 7 cm and mesenteric 6 cm)  -  Negative for lymphovascular and perineural invasion.  -  Focal high-grade tumor budding present.  -  1 of 22 lymph nodes positive for malignancy  pT3 pN1 pM n/a   B. FINAL DISTAL MARGIN:  -   Unremarkable colonic mucosa, negative for malignancy.    ADDENDUM:  Mismatch Repair Protein (IHC)  SUMMARY INTERPRETATION: NORMAL   10/17/2021 Cancer Staging   Staging form: Colon and Rectum, AJCC 8th Edition - Pathologic stage from 10/17/2021: Stage IIIB (pT3, pN1a, cM0) - Signed by Truitt Merle, MD on 11/12/2021 Total positive nodes: 1 Histologic grading system: 4 grade system Histologic grade (G): G2 Residual tumor (R): R0 - None   11/19/2021 -  Chemotherapy   Patient is on Treatment Plan : COLORECTAL Xelox (Capeox) q21d      CURRENT THERAPY: Surveillance  INTERVAL HISTORY: Joseangel Swavely 37 y.o. male returns to the clinic today for a 95-monthfollow-up visit.  The patient is on observation and doing well.  He denies any concerning complaints today.  He denies any fever, chills, night sweats, or unexplained weight loss.  He reports a good appetite.  He denies any nausea, vomiting, diarrhea, constipation, jaundice, itching.  Denies any abdominal pain although sometimes near his surgical site the patient reports there is a sensitive area and he is unsure if he has a hernia.  There is no bulging or overlying skin changes with the patient states that he feels like he can feel his comfort near the incision. He believes he first noted this in October. No hernia was noted on CT scan from 03/22/22. He reports he is due to see his surgeon soon.  He also needs to call GI to schedule a follow-up colonoscopy which will be in April 2024.  The patient denies any blood in the  stool.  He is here today for evaluation and repeat blood work.   MEDICAL HISTORY: Past Medical History:  Diagnosis Date   Allergy    Anemia    Anxiety    Bronchitis    Cancer ([place]    Depression     ALLERGIES:  has No Known Allergies.  MEDICATIONS:  Current Outpatient Medications  Medication Sig Dispense Refill   Acetaminophen (TYLENOL PO) Take 1,250 mg by mouth every 8 (eight) hours as needed.     Ascorbic Acid (VITAMIN C) 100 MG tablet Take 100 mg by mouth daily.     cholecalciferol (VITAMIN D3) 25 MCG (1000 UNIT) tablet Take 1,000 Units by mouth daily.     ferrous sulfate 325 (65 FE) MG tablet Take 325 mg by mouth daily with breakfast.     ibuprofen (ADVIL) 800 MG tablet Take 800 mg by mouth every 8 (eight) hours as needed.     Multiple Vitamin (MULTIVITAMIN WITH MINERALS) TABS tablet Take 1 tablet by mouth daily.     Omega-3 Fatty Acids (FISH OIL PO) Take 1 capsule by mouth daily.     zinc gluconate 50 MG tablet Take 50 mg by mouth daily.     ondansetron (ZOFRAN) 8 MG tablet Take 1 tablet (8 mg total) by mouth 2 (two) times daily as needed for refractory nausea / vomiting. Start on day 3 after chemotherapy. (Patient not taking: Reported on 06/28/2022) 30 tablet  1   prochlorperazine (COMPAZINE) 10 MG tablet Take 1 tablet (10 mg total) by mouth every 6 (six) hours as needed (Nausea or vomiting). (Patient not taking: Reported on 06/28/2022) 30 tablet 1   No current facility-administered medications for this visit.    SURGICAL HISTORY:  Past Surgical History:  Procedure Laterality Date   ANTERIOR CRUCIATE LIGAMENT (ACL) REVISION Left    FLEXIBLE SIGMOIDOSCOPY N/A 10/17/2021   Procedure: FLEXIBLE SIGMOIDOSCOPY, INTRAOPERATIVE ASSESSMENT OF PERFUSION;  Surgeon: Ileana Roup, MD;  Location: [facility];  Service: General;  Laterality: N/A;   XI ROBOTIC ASSISTED LOWER ANTERIOR RESECTION N/A 10/17/2021   Procedure: XI ROBOTIC ASSISTED LOWER ANTERIOR RESECTION;  Surgeon: Ileana Roup, MD;  Location: [facility];  Service: General;  Laterality: N/A;  GEN AND TAP BLOCK    REVIEW OF SYSTEMS:   Review of Systems  Constitutional: Negative for appetite change, chills, fatigue, fever and unexpected weight change.  HENT: Negative for mouth sores, nosebleeds, sore throat and trouble swallowing.   Eyes: Negative for eye problems and icterus.  Respiratory: Negative for cough, hemoptysis, shortness of breath and wheezing.   Cardiovascular: Negative for chest pain and leg swelling.  Gastrointestinal: Occasional mild discomfort over surgical site. Negative for abdominal pain, constipation, diarrhea, nausea and vomiting.  Genitourinary: Negative for bladder incontinence, difficulty urinating, dysuria, frequency and hematuria.   Musculoskeletal: Negative for back pain, gait problem, neck pain and neck stiffness.  Skin: Negative for itching and rash.  Neurological: Negative for dizziness, extremity weakness, gait problem, headaches, light-headedness and seizures.  Hematological: Negative for adenopathy. Does not bruise/bleed easily.  Psychiatric/Behavioral: Negative for confusion, depression and sleep disturbance. The patient is not nervous/anxious.     PHYSICAL EXAMINATION:  Blood pressure 121/72, pulse 84, temperature 97.7 F (36.5 C), temperature source Temporal, resp. rate 16, weight 253 lb 11.2 oz (115.1 kg), SpO2 98 %.  ECOG PERFORMANCE STATUS: 1  Physical Exam  Constitutional: Oriented to person, place, and time and well-developed, well-nourished, and in no distress. HENT:  Head: Normocephalic and atraumatic.  Mouth/Throat: Oropharynx is clear and moist. No oropharyngeal exudate.  Eyes: Conjunctivae are normal. Right eye exhibits no discharge. Left eye exhibits no discharge. No scleral icterus.  Neck: Normal range of motion. Neck supple.  Cardiovascular: Normal rate, regular rhythm, normal heart sounds and intact distal pulses.   Pulmonary/Chest: Effort normal and  breath sounds normal. No respiratory distress. No wheezes. No rales.  Abdominal: Soft. Bowel sounds are normal. Exhibits no distension and no mass. There is no tenderness.  Musculoskeletal: Normal range of motion. Exhibits no edema.  Lymphadenopathy:    No cervical adenopathy.  Neurological: Alert and oriented to person, place, and time. Exhibits normal muscle tone. Gait normal. Coordination normal.  Skin: Skin is warm and dry. No rash noted. Not diaphoretic. No erythema. No pallor.  Psychiatric: Mood, memory and judgment normal.  Vitals reviewed.  LABORATORY DATA: Lab Results  Component Value Date   WBC 7.3 06/28/2022   HGB 14.6 06/28/2022   HCT 41.8 06/28/2022   MCV 88.6 06/28/2022   PLT 238 06/28/2022      Chemistry      Component Value Date/Time   NA 139 06/28/2022 0911   K 4.3 06/28/2022 0911   CL 106 06/28/2022 0911   CO2 28 06/28/2022 0911   BUN 17 06/28/2022 0911   CREATININE 1.08 06/28/2022 0911      Component Value Date/Time   CALCIUM 9.5 06/28/2022 0911   ALKPHOS 60 06/28/2022 0911  AST 27 06/28/2022 0911   ALT 39 06/28/2022 0911   BILITOT 1.4 (H) 06/28/2022 0911       RADIOGRAPHIC STUDIES:  No results found.   ASSESSMENT/PLAN:  Markian Glockner is a 37 y.o. male with    1. Adenocarcinoma of sigmoid colon, G1, stage IIIB p(T3, N1a) cM0, stage IIIB, MSS -initially developed abdominal pain/cramping in 02/2021 and rectal bleeding since 05/2021. Colonoscopy 09/11/21 showed 6 cm sigmoid colon mass located 20-26 cm from anal verge. Biopsy confirmed adenocarcinoma. -staging CT 4/25 and MRI 4/27 showed suspicious mesenteric lymph nodes, no distant metastasis. -s/p surgical resection 10/17/21 with Dr. Dema Severin, path showed clear margins and one positive node -baseline CEA 11/13/21 was WNL. -given his stage IIIB disease, he completed 4 cycles adjuvant CAPEOX 11/19/21 - 02/03/22. He tolerated relatively well overall. He is wondering if he has a mild hernia over surgical  site. I did not appreciate any hernia. He is expected to follow up with his surgeon soon regarding this.  -post-treatment CT CAP 03/22/22 showed NED. His next restaging CT scan is due in about 3 months. I will place the order today to have this done a few days before his next visit.  -he is clinically doing very well with minimal remaining side effects. Labs from today reviewed, improving off chemo. His CBC is unremarkable. His CMP is WNL except stable slightly elevated bilirubin. CEA pending and Iron studies.  -he will be due for surveillance colonoscopy in 08/2022; we will see him back in [place] and follow up visit in 3 months with Dr. Burr Medico and to review the results of his CT scan    2. Mild Anemia, iron deficiency -secondary to #1 -improving off chemo. Labs from 03/18/22 showed hgb and iron WNL but ferritin low at 14. Dr. Burr Medico recommended he restart oral iron for a few months -Hbg is normal today. Iron studies pending.        PLAN:  -lab and f/u with in 3 months, plan to repeat CT CAP in 3 months  -He is due for surveillance colonoscopy in April 2024.    Orders Placed This Encounter  Procedures   CT CHEST ABDOMEN PELVIS W CONTRAST    Standing Status:   Future    Standing Expiration Date:   06/29/2023    Order Specific Question:   If indicated for the ordered procedure, I authorize the administration of contrast media per Radiology protocol    Answer:   Yes    Order Specific Question:   Does the patient have a contrast media/X-ray dye allergy?    Answer:   No    Order Specific Question:   Preferred imaging location?    Answer:   [facility]    Order Specific Question:   Is Oral Contrast requested for this exam?    Answer:   Yes, Per Radiology protocol     The total time spent in the appointment was 20-29 minutes.   Kelvin Sennett L Cassidi Modesitt, PA-C 06/28/22

## 2022-06-28 ENCOUNTER — Other Ambulatory Visit: Payer: Self-pay

## 2022-06-28 ENCOUNTER — Inpatient Hospital Stay: Payer: 59 | Attending: Genetic Counselor

## 2022-06-28 ENCOUNTER — Inpatient Hospital Stay (HOSPITAL_BASED_OUTPATIENT_CLINIC_OR_DEPARTMENT_OTHER): Payer: 59 | Admitting: Physician Assistant

## 2022-06-28 ENCOUNTER — Encounter: Payer: Self-pay | Admitting: Hematology

## 2022-06-28 VITALS — BP 121/72 | HR 84 | Temp 97.7°F | Resp 16 | Wt 253.7 lb

## 2022-06-28 DIAGNOSIS — Z800 Family history of malignant neoplasm of digestive organs: Secondary | ICD-10-CM | POA: Diagnosis not present

## 2022-06-28 DIAGNOSIS — D630 Anemia in neoplastic disease: Secondary | ICD-10-CM | POA: Insufficient documentation

## 2022-06-28 DIAGNOSIS — Z8052 Family history of malignant neoplasm of bladder: Secondary | ICD-10-CM | POA: Insufficient documentation

## 2022-06-28 DIAGNOSIS — C187 Malignant neoplasm of sigmoid colon: Secondary | ICD-10-CM

## 2022-06-28 DIAGNOSIS — D500 Iron deficiency anemia secondary to blood loss (chronic): Secondary | ICD-10-CM

## 2022-06-28 LAB — IRON AND IRON BINDING CAPACITY (CC-WL,HP ONLY)
Iron: 138 ug/dL (ref 45–182)
Saturation Ratios: 38 % (ref 17.9–39.5)
TIBC: 361 ug/dL (ref 250–450)
UIBC: 223 ug/dL (ref 117–376)

## 2022-06-28 LAB — CBC WITH DIFFERENTIAL/PLATELET
Abs Immature Granulocytes: 0.02 10*3/uL (ref 0.00–0.07)
Basophils Absolute: 0.1 10*3/uL (ref 0.0–0.1)
Basophils Relative: 1 %
Eosinophils Absolute: 0.4 10*3/uL (ref 0.0–0.5)
Eosinophils Relative: 5 %
HCT: 41.8 % (ref 39.0–52.0)
Hemoglobin: 14.6 g/dL (ref 13.0–17.0)
Immature Granulocytes: 0 %
Lymphocytes Relative: 27 %
Lymphs Abs: 2 10*3/uL (ref 0.7–4.0)
MCH: 30.9 pg (ref 26.0–34.0)
MCHC: 34.9 g/dL (ref 30.0–36.0)
MCV: 88.6 fL (ref 80.0–100.0)
Monocytes Absolute: 0.7 10*3/uL (ref 0.1–1.0)
Monocytes Relative: 9 %
Neutro Abs: 4.2 10*3/uL (ref 1.7–7.7)
Neutrophils Relative %: 58 %
Platelets: 238 10*3/uL (ref 150–400)
RBC: 4.72 MIL/uL (ref 4.22–5.81)
RDW: 13 % (ref 11.5–15.5)
WBC: 7.3 10*3/uL (ref 4.0–10.5)
nRBC: 0 % (ref 0.0–0.2)

## 2022-06-28 LAB — COMPREHENSIVE METABOLIC PANEL
ALT: 39 U/L (ref 0–44)
AST: 27 U/L (ref 15–41)
Albumin: 4.3 g/dL (ref 3.5–5.0)
Alkaline Phosphatase: 60 U/L (ref 38–126)
Anion gap: 5 (ref 5–15)
BUN: 17 mg/dL (ref 6–20)
CO2: 28 mmol/L (ref 22–32)
Calcium: 9.5 mg/dL (ref 8.9–10.3)
Chloride: 106 mmol/L (ref 98–111)
Creatinine, Ser: 1.08 mg/dL (ref 0.61–1.24)
GFR, Estimated: >60 mL/min (ref >60)
Glucose, Bld: 96 mg/dL (ref 70–99)
Potassium: 4.3 mmol/L (ref 3.5–5.1)
Sodium: 139 mmol/L (ref 135–145)
Total Bilirubin: 1.4 mg/dL — ABNORMAL HIGH (ref 0.3–1.2)
Total Protein: 7.6 g/dL (ref 6.5–8.1)

## 2022-06-28 LAB — CEA (IN HOUSE-CHCC): CEA (CHCC-In House): 2.45 ng/mL (ref 0.00–5.00)

## 2022-06-28 LAB — FERRITIN: Ferritin: 20 ng/mL — ABNORMAL LOW (ref 24–336)

## 2022-06-30 ENCOUNTER — Other Ambulatory Visit: Payer: Self-pay

## 2022-07-29 ENCOUNTER — Other Ambulatory Visit: Payer: Self-pay

## 2022-08-21 ENCOUNTER — Telehealth: Payer: Self-pay | Admitting: Gastroenterology

## 2022-08-21 NOTE — Telephone Encounter (Signed)
Good afternoon Dr. Bryan Lemma,   We received a call from this patient, requesting to schedule a colonoscopy. Patient stated he was told to follow up in a year for a recall colonoscopy due to the cancerous results found on the previous procedure. Would you like to see patient in the office prior to scheduling the procedure directly or would I be able to schedule procedure directly. Please advise.

## 2022-08-30 ENCOUNTER — Encounter: Payer: Self-pay | Admitting: Gastroenterology

## 2022-08-31 ENCOUNTER — Other Ambulatory Visit: Payer: Self-pay

## 2022-09-09 ENCOUNTER — Encounter: Payer: Self-pay | Admitting: *Deleted

## 2022-09-11 ENCOUNTER — Telehealth: Payer: Self-pay

## 2022-09-11 ENCOUNTER — Other Ambulatory Visit: Payer: Self-pay

## 2022-09-11 ENCOUNTER — Encounter: Payer: Self-pay | Admitting: Hematology

## 2022-09-11 NOTE — Telephone Encounter (Signed)
Spoke with pt via telephone to inform pt that Dr. Mosetta Putt would like to reschedule his CT Scan appt until the 1st to 2nd wk of May.  Informed pt that his lab appt will be scheduled the same day as his CT Scan and his f/u appt maybe adjusted if the CT Scan is scheduled after the current scheduled f/u appt with Dr. Mosetta Putt on 10/07/2022.  Pt verbalized understanding and had no further questions or concerns.

## 2022-09-12 ENCOUNTER — Ambulatory Visit (HOSPITAL_COMMUNITY): Payer: 59

## 2022-09-12 ENCOUNTER — Other Ambulatory Visit: Payer: Self-pay

## 2022-09-13 ENCOUNTER — Ambulatory Visit: Payer: 59 | Admitting: Family Medicine

## 2022-09-13 ENCOUNTER — Encounter: Payer: Self-pay | Admitting: Family Medicine

## 2022-09-13 VITALS — BP 136/84 | HR 97 | Temp 98.7°F | Ht 74.0 in | Wt 259.8 lb

## 2022-09-13 DIAGNOSIS — F419 Anxiety disorder, unspecified: Secondary | ICD-10-CM

## 2022-09-13 DIAGNOSIS — G44209 Tension-type headache, unspecified, not intractable: Secondary | ICD-10-CM

## 2022-09-13 MED ORDER — HYDROXYZINE PAMOATE 25 MG PO CAPS: 25.0000 mg | ORAL_CAPSULE | Freq: Three times a day (TID) | ORAL | 0 refills | Status: AC | PRN

## 2022-09-13 MED ORDER — BUTALBITAL-APAP-CAFFEINE 50-325-40 MG PO TABS: 1.0000 | ORAL_TABLET | Freq: Four times a day (QID) | ORAL | 0 refills | Status: DC | PRN

## 2022-09-13 NOTE — Progress Notes (Signed)
[facility] [address] [place] [address] Dept: [phone] Dept Fax: [phone]  Office Visit  Subjective:    Patient ID: Philip Clark, male    DOB: Mar 19, 1986, 37 y.o..   MRN: [id number]  Chief Complaint  Patient presents with   Headache    C/o having HA's 3-4 weeks.has been taking Ibuprofen and Migraine meds.    History of Present Illness:  Patient is in today complaining of headaches occurring over the past 3-4 weeks. Currently, he has a headache that started on several days ago and has been persistent. The headaches are described as involving the entire scalp, radiating forward from the neck to behind the eyes. Occasionally, these have felt more right-sided. He had concerns for his vision, so went to see an eye doctor. He was found to have an astigmatism and is being provided glasses. Philip Clark has a history of colon cancer, which was treated last year. He notes his wife was concerned that this might represent a metastasis. He spoke with his oncologist, who told him to see his PCP. He has tried using caffeine, Tylenol, and ibuprofen without significant relief. He tried his wife's Maxlat and this maybe helped a small amount. He denies any neurological symptoms.  Philip Clark also raises concerns about anxiety. He states he has had intermittent episodes in the past few years of a panic attack. He was treated at one point with alprazolam. He also thinks he may have been given hydroxyzine. More recently, he has had a few episodes of the panicky feeling.  Past Medical History: Patient Active Problem List   Diagnosis Date Noted   Genetic testing 10/29/2021   S/P laparoscopic-assisted sigmoidectomy 10/17/2021   Adenocarcinoma of sigmoid colon 10/02/2021   IDA (iron deficiency anemia) 08/13/2021   Heme positive stool 07/02/2021   Hematochezia 07/02/2021   Lower abdominal pain 07/02/2021   Anemia 07/02/2021   Elevated  pulse rate 04/17/2021   Viral syndrome 04/17/2021   Rib pain 10/19/2020   Lumbar radiculopathy 10/19/2020   Rib cage dysfunction 10/18/2020   Cauda equina injury without bone injury 10/18/2020   Bronchitis    Anxiety    Allergy    S/P ACL reconstruction 07/16/2016   Injury of left knee 05/28/2016   Knee LCL sprain 11/24/2014   Past Surgical History:  Procedure Laterality Date   ANTERIOR CRUCIATE LIGAMENT (ACL) REVISION Left    FLEXIBLE SIGMOIDOSCOPY N/A 10/17/2021   Procedure: FLEXIBLE SIGMOIDOSCOPY, INTRAOPERATIVE ASSESSMENT OF PERFUSION;  Surgeon: Andria Meuse, MD;  Location: [facility];  Service: General;  Laterality: N/A;   XI ROBOTIC ASSISTED LOWER ANTERIOR RESECTION N/A 10/17/2021   Procedure: XI ROBOTIC ASSISTED LOWER ANTERIOR RESECTION;  Surgeon: Andria Meuse, MD;  Location: [facility];  Service: General;  Laterality: N/A;  GEN AND TAP BLOCK   Family History  Problem Relation Age of Onset   Anxiety disorder Mother    Heart disease Father 22       MI x 2   Skin cancer Father        basal or squamous cell   Dementia Maternal Grandmother    Heart disease Maternal Grandfather 86       CAD with CABG   Bladder Cancer Maternal Grandfather        dx. 60s   Colon cancer Paternal Grandmother        dx. 60s/70s   Early death Paternal Grandfather 2       CAD/MI  Rectal cancer Neg Hx    Stomach cancer Neg Hx    Esophageal cancer Neg Hx    Outpatient Medications Prior to Visit  Medication Sig Dispense Refill   Acetaminophen (TYLENOL PO) Take 1,250 mg by mouth every 8 (eight) hours as needed.     ibuprofen (ADVIL) 800 MG tablet Take 800 mg by mouth every 8 (eight) hours as needed.     ondansetron (ZOFRAN) 8 MG tablet Take 1 tablet (8 mg total) by mouth 2 (two) times daily as needed for refractory nausea / vomiting. Start on day 3 after chemotherapy. 30 tablet 1   zinc gluconate 50 MG tablet Take 50 mg by mouth daily.     Ascorbic Acid (VITAMIN C) 100 MG tablet Take 100  mg by mouth daily. (Patient not taking: Reported on 09/13/2022)     cholecalciferol (VITAMIN D3) 25 MCG (1000 UNIT) tablet Take 1,000 Units by mouth daily. (Patient not taking: Reported on 09/13/2022)     ferrous sulfate 325 (65 FE) MG tablet Take 325 mg by mouth daily with breakfast. (Patient not taking: Reported on 09/13/2022)     Multiple Vitamin (MULTIVITAMIN WITH MINERALS) TABS tablet Take 1 tablet by mouth daily. (Patient not taking: Reported on 09/13/2022)     Omega-3 Fatty Acids (FISH OIL PO) Take 1 capsule by mouth daily. (Patient not taking: Reported on 09/13/2022)     prochlorperazine (COMPAZINE) 10 MG tablet Take 1 tablet (10 mg total) by mouth every 6 (six) hours as needed (Nausea or vomiting). (Patient not taking: Reported on 06/28/2022) 30 tablet 1   No facility-administered medications prior to visit.   No Known Allergies   Objective:   Today's Vitals   09/13/22 1554  BP: 136/84  Pulse: 97  Temp: 98.7 F (37.1 C)  TempSrc: Temporal  SpO2: 98%  Weight: 259 lb 12.8 oz (117.8 kg)  Height:  (1.88 m)   Body mass index is 33.36 kg/m.   General: Well developed, well nourished. No acute distress. HEENT: Normocephalic, non-traumatic. PERRL, EOMI. Conjunctiva clear. External ears normal. EAC   and TMs normal bilaterally. Nose clear without congestion or rhinorrhea. Mucous membranes moist.   Oropharynx clear. Good dentition. Neck: Supple. No lymphadenopathy. No thyromegaly. Neuro: CN II-XII intact. Psych: Alert and oriented. Normal mood and affect.  Health Maintenance Due  Topic Date Due   HIV Screening  Never done   Hepatitis C Screening  Never done   DTaP/Tdap/Td (2 - Td or Tdap) 11/08/2021     Assessment & Plan:   Problem List Items Addressed This Visit       Other   Anxiety    I will prescribe some hydroxyzine for periodic use. If this is not effective, we might consider buspirone.      Relevant Medications   hydrOXYzine (VISTARIL) 25 MG capsule   Other  Visit Diagnoses     Acute non intractable tension-type headache    -  Primary   Appears to be more of a tension headache. I will try him on soem Fioricet. If the headache persists or if new symptoms develop, would consider CT.   Relevant Medications   butalbital-acetaminophen-caffeine (FIORICET) 50-325-40 MG tablet       Return if symptoms worsen or fail to improve.   Loyola Mast, MD

## 2022-09-13 NOTE — Assessment & Plan Note (Signed)
I will prescribe some hydroxyzine for periodic use. If this is not effective, we might consider buspirone.

## 2022-09-20 ENCOUNTER — Ambulatory Visit: Payer: 59 | Admitting: Family Medicine

## 2022-09-20 ENCOUNTER — Encounter: Payer: Self-pay | Admitting: Hematology

## 2022-09-24 ENCOUNTER — Ambulatory Visit (AMBULATORY_SURGERY_CENTER): Payer: 59

## 2022-09-24 ENCOUNTER — Encounter: Payer: Self-pay | Admitting: Gastroenterology

## 2022-09-24 VITALS — Ht 74.0 in | Wt 250.0 lb

## 2022-09-24 DIAGNOSIS — C187 Malignant neoplasm of sigmoid colon: Secondary | ICD-10-CM

## 2022-09-24 NOTE — Progress Notes (Signed)

## 2022-09-26 ENCOUNTER — Other Ambulatory Visit: Payer: Self-pay | Admitting: Hematology

## 2022-09-26 DIAGNOSIS — C187 Malignant neoplasm of sigmoid colon: Secondary | ICD-10-CM

## 2022-10-03 ENCOUNTER — Ambulatory Visit (HOSPITAL_COMMUNITY)
Admission: RE | Admit: 2022-10-03 | Discharge: 2022-10-03 | Disposition: A | Discharge status: Home / Self Care | Payer: 59 | Source: Ambulatory Visit | Attending: Physician Assistant | Admitting: Physician Assistant

## 2022-10-03 ENCOUNTER — Other Ambulatory Visit: Payer: Self-pay

## 2022-10-03 ENCOUNTER — Inpatient Hospital Stay: Payer: 59 | Attending: Genetic Counselor

## 2022-10-03 ENCOUNTER — Ambulatory Visit (HOSPITAL_COMMUNITY): Payer: 59

## 2022-10-03 DIAGNOSIS — C187 Malignant neoplasm of sigmoid colon: Secondary | ICD-10-CM

## 2022-10-03 DIAGNOSIS — Z8052 Family history of malignant neoplasm of bladder: Secondary | ICD-10-CM | POA: Insufficient documentation

## 2022-10-03 DIAGNOSIS — R519 Headache, unspecified: Secondary | ICD-10-CM | POA: Insufficient documentation

## 2022-10-03 DIAGNOSIS — Z9221 Personal history of antineoplastic chemotherapy: Secondary | ICD-10-CM | POA: Insufficient documentation

## 2022-10-03 DIAGNOSIS — Z800 Family history of malignant neoplasm of digestive organs: Secondary | ICD-10-CM | POA: Insufficient documentation

## 2022-10-03 DIAGNOSIS — D500 Iron deficiency anemia secondary to blood loss (chronic): Secondary | ICD-10-CM

## 2022-10-03 LAB — CBC WITH DIFFERENTIAL/PLATELET
Abs Immature Granulocytes: 0.02 10*3/uL (ref 0.00–0.07)
Basophils Absolute: 0 10*3/uL (ref 0.0–0.1)
Basophils Relative: 1 %
Eosinophils Absolute: 0.4 10*3/uL (ref 0.0–0.5)
Eosinophils Relative: 6 %
HCT: 42.1 % (ref 39.0–52.0)
Hemoglobin: 14.4 g/dL (ref 13.0–17.0)
Immature Granulocytes: 0 %
Lymphocytes Relative: 29 %
Lymphs Abs: 2.2 10*3/uL (ref 0.7–4.0)
MCH: 30.9 pg (ref 26.0–34.0)
MCHC: 34.2 g/dL (ref 30.0–36.0)
MCV: 90.3 fL (ref 80.0–100.0)
Monocytes Absolute: 0.7 10*3/uL (ref 0.1–1.0)
Monocytes Relative: 9 %
Neutro Abs: 4.3 10*3/uL (ref 1.7–7.7)
Neutrophils Relative %: 55 %
Platelets: 245 10*3/uL (ref 150–400)
RBC: 4.66 MIL/uL (ref 4.22–5.81)
RDW: 12.8 % (ref 11.5–15.5)
WBC: 7.7 10*3/uL (ref 4.0–10.5)
nRBC: 0 % (ref 0.0–0.2)

## 2022-10-03 LAB — COMPREHENSIVE METABOLIC PANEL
ALT: 35 U/L (ref 0–44)
AST: 26 U/L (ref 15–41)
Albumin: 4.5 g/dL (ref 3.5–5.0)
Alkaline Phosphatase: 62 U/L (ref 38–126)
Anion gap: 4 — ABNORMAL LOW (ref 5–15)
BUN: 20 mg/dL (ref 6–20)
CO2: 30 mmol/L (ref 22–32)
Calcium: 9.3 mg/dL (ref 8.9–10.3)
Chloride: 105 mmol/L (ref 98–111)
Creatinine, Ser: 1.31 mg/dL — ABNORMAL HIGH (ref 0.61–1.24)
GFR, Estimated: >60 mL/min (ref >60)
Glucose, Bld: 86 mg/dL (ref 70–99)
Potassium: 4.2 mmol/L (ref 3.5–5.1)
Sodium: 139 mmol/L (ref 135–145)
Total Bilirubin: 0.9 mg/dL (ref 0.3–1.2)
Total Protein: 8.1 g/dL (ref 6.5–8.1)

## 2022-10-03 LAB — IRON AND IRON BINDING CAPACITY (CC-WL,HP ONLY)
Iron: 166 ug/dL (ref 45–182)
Saturation Ratios: 47 % — ABNORMAL HIGH (ref 17.9–39.5)
TIBC: 353 ug/dL (ref 250–450)
UIBC: 187 ug/dL (ref 117–376)

## 2022-10-03 LAB — CEA (IN HOUSE-CHCC): CEA (CHCC-In House): 2.86 ng/mL (ref 0.00–5.00)

## 2022-10-03 LAB — FERRITIN: Ferritin: 28 ng/mL (ref 24–336)

## 2022-10-03 MED ORDER — IOHEXOL 9 MG/ML PO SOLN
ORAL | Status: AC
  Filled 2022-10-03: qty 1000

## 2022-10-03 MED ORDER — IOHEXOL 9 MG/ML PO SOLN
1000.0000 mL | ORAL | Status: AC
  Administered 2022-10-03: 1000 mL via ORAL

## 2022-10-03 MED ORDER — IOHEXOL 300 MG/ML  SOLN
100.0000 mL | Freq: Once | INTRAMUSCULAR | Status: AC | PRN
  Administered 2022-10-03: 100 mL via INTRAVENOUS

## 2022-10-06 NOTE — Assessment & Plan Note (Signed)
G1, stage IIIB p(T3, N1a) cM0, stage IIIB, MSS -initially developed abdominal pain/cramping in 02/2021 and rectal bleeding since 05/2021. Colonoscopy 09/11/21 showed 6 cm sigmoid colon mass located 20-26 cm from anal verge. Biopsy confirmed adenocarcinoma. -staging CT 4/25 and MRI 4/27 showed suspicious mesenteric lymph nodes, no distant metastasis. -s/p surgical resection 10/17/21 with Dr. Cliffton Asters, path showed clear margins and one positive node -baseline CEA 11/13/21 was WNL. -given his stage IIIB disease, he completed 4 cycles adjuvant CAPEOX 11/19/21 - 02/03/22. He tolerated relatively well overall. -post-treatment CT CAP 03/22/22 showed NED. -he is clinically doing very well with minimal remaining side effects.  -his surveillance CT on 10/03/2022 showed

## 2022-10-07 ENCOUNTER — Inpatient Hospital Stay (HOSPITAL_BASED_OUTPATIENT_CLINIC_OR_DEPARTMENT_OTHER): Payer: 59 | Admitting: Hematology

## 2022-10-07 ENCOUNTER — Encounter: Payer: Self-pay | Admitting: Hematology

## 2022-10-07 ENCOUNTER — Other Ambulatory Visit: Payer: Self-pay

## 2022-10-07 ENCOUNTER — Other Ambulatory Visit: Payer: 59

## 2022-10-07 VITALS — BP 130/87 | HR 89 | Temp 98.1°F | Resp 15 | Wt 264.2 lb

## 2022-10-07 DIAGNOSIS — Z9221 Personal history of antineoplastic chemotherapy: Secondary | ICD-10-CM | POA: Diagnosis not present

## 2022-10-07 DIAGNOSIS — R519 Headache, unspecified: Secondary | ICD-10-CM | POA: Diagnosis not present

## 2022-10-07 DIAGNOSIS — Z8052 Family history of malignant neoplasm of bladder: Secondary | ICD-10-CM | POA: Diagnosis not present

## 2022-10-07 DIAGNOSIS — C187 Malignant neoplasm of sigmoid colon: Secondary | ICD-10-CM | POA: Diagnosis not present

## 2022-10-07 DIAGNOSIS — Z800 Family history of malignant neoplasm of digestive organs: Secondary | ICD-10-CM | POA: Diagnosis not present

## 2022-10-07 NOTE — Progress Notes (Signed)
[facility]   [phone] [phone]   Clinic Follow up Note   Patient Care Team: Mliss Sax, MD as PCP - General (Family Medicine) Thomasene Ripple, DO as PCP - Cardiology (Cardiology) Malachy Mood, MD as Consulting Physician (Oncology)  Date of Service:  10/07/2022  CHIEF COMPLAINT: f/u of colon cancer   CURRENT THERAPY:  Surveillance   ASSESSMENT:  Philip Clark is a 37 y.o. male with   Adenocarcinoma of sigmoid colon (HCC) G1, stage IIIB p(T3, N1a) cM0, stage IIIB, MSS -initially developed abdominal pain/cramping in 02/2021 and rectal bleeding since 05/2021. Colonoscopy 09/11/21 showed 6 cm sigmoid colon mass located 20-26 cm from anal verge. Biopsy confirmed adenocarcinoma. -staging CT 4/25 and MRI 4/27 showed suspicious mesenteric lymph nodes, no distant metastasis. -s/p surgical resection 10/17/21 with Dr. Cliffton Asters, path showed clear margins and one positive node -baseline CEA 11/13/21 was WNL. -given his stage IIIB disease, he completed 4 cycles adjuvant CAPEOX 11/19/21 - 02/03/22. He tolerated relatively well overall. -post-treatment CT CAP 03/22/22 showed NED. -he is clinically doing very well with minimal remaining side effects.  -his surveillance CT on 10/03/2022 showed NED, I personally reviewed his scan images and discussed the findings with him -Continue cancer surveillance.  Headache -He has had a persistent headache for over 1 month -He is scheduled for brain MRI later this week -If MRI negative, I will refer him to [facility] neurology.  PLAN: -MRI Brain schedule 5/17 -Discuss Ct scan from 10/03/2022 which showed no evidence of cancer recurrence -repeat CT scan in 6 months -lab and f/u in 3 months  SUMMARY OF ONCOLOGIC HISTORY: Oncology History Overview Note   Cancer Staging  Adenocarcinoma of sigmoid colon [facility]) Staging form: Colon and Rectum, AJCC 8th Edition - Pathologic stage from 10/17/2021: Stage IIIB (pT3, pN1a, cM0) -  Signed by Malachy Mood, MD on 11/12/2021    Adenocarcinoma of sigmoid colon (HCC)  09/11/2021 Procedure   Colonoscopy, Dr. Barron Alvine  Impression - One 8 mm polyp in the ascending colon, removed with a cold snare. Resected and retrieved. - One 4 mm polyp in the sigmoid colon, removed with a cold snare. Resected and retrieved. - Malignant partially obstructing tumor in the sigmoid colon, located 20-26 cm from the anal verge. This was traversed. Biopsied. Tattoo placed 1-2 cm distal to the lesion. - One 2 mm polyp in the rectum, removed with a cold biopsy forceps. Resected and retrieved. - The distal rectum and anal verge are normal on retroflexion view. - The examined portion of the ileum was normal.   09/11/2021 Initial Biopsy   Diagnosis 1. Duodenum, Biopsy - DUODENAL MUCOSA WITH NORMAL VILLOUS ARCHITECTURE. - NO VILLOUS ATROPHY OR INCREASED INTRAEPITHELIAL LYMPHOCYTES. 2. Duodenum, Biopsy, bulb - DUODENAL BULB WITH PEPTIC INJURY. 3. Ascending Colon Polyp - MULTIPLE FRAGMENTS OF TUBULAR ADENOMA WITHOUT HIGH GRADE DYSPLASIA. 4. Sigmoid Colon Polyp - INVASIVE COLONIC ADENOCARCINOMA. - SEE MICROSCOPIC DESCRIPTION. 5. Colon, polyp(s), mass - INVASIVE COLONIC ADENOCARCINOMA. - SEE MICROSCOPIC DESCRIPTION. 6. Rectum, polyp(s) - HYPERPLASTIC POLYP. Microscopic Comment 4. and 5. The carcinoma in parts 4 and 5 have identical morphologic features.   09/18/2021 Imaging   EXAM: CT CHEST, ABDOMEN, AND PELVIS WITH CONTRAST  IMPRESSION: 1. Short segment asymmetric wall thickening of the sigmoid colon is consistent with patient's known sigmoid colon cancer. 2. Prominent lymph nodes in the sigmoid mesentery measure up to 6 mm in short axis, which likely reflect local nodal disease involvement. 3. No evidence of distant metastatic disease in  the chest, abdomen or pelvis. 4. No evidence of high-grade bowel obstruction. However, there is a large volume of formed stool throughout the colon    09/20/2021 Imaging   EXAM: MRI PELVIS WITHOUT AND WITH CONTRAST  IMPRESSION: 1. Partially obstructing malignancy of the mid sigmoid colon with prominent sigmoid mesenteric lymph nodes suspicious for early nodal metastases. 2. No evidence bowel obstruction, perforation, ascites or peritoneal nodularity. 3. Left paracentral disc protrusion at L5-S1 with resulting left S1 nerve root encroachment.   10/02/2021 Initial Diagnosis   Adenocarcinoma of sigmoid colon (HCC)   10/09/2021 Genetic Testing   [facility] CancerNext-Expanded Panel was Negative. Report date is 10/26/2021.  The CancerNext-Expanded gene panel offered by [facility] and includes sequencing, rearrangement, and RNA analysis for the following 77 genes: AIP, ALK, APC, ATM, AXIN2, BAP1, BARD1, BLM, BMPR1A, BRCA1, BRCA2, BRIP1, CDC73, CDH1, CDK4, CDKN1B, CDKN2A, CHEK2, CTNNA1, DICER1, FANCC, FH, FLCN, GALNT12, KIF1B, LZTR1, MAX, MEN1, MET, MLH1, MSH2, MSH3, MSH6, MUTYH, NBN, NF1, NF2, NTHL1, PALB2, PHOX2B, PMS2, POT1, PRKAR1A, PTCH1, PTEN, RAD51C, RAD51D, RB1, RECQL, RET, SDHA, SDHAF2, SDHB, SDHC, SDHD, SMAD4, SMARCA4, SMARCB1, SMARCE1, STK11, SUFU, TMEM127, TP53, TSC1, TSC2, VHL and XRCC2 (sequencing and deletion/duplication); EGFR, EGLN1, HOXB13, KIT, MITF, PDGFRA, POLD1, and POLE (sequencing only); EPCAM and GREM1 (deletion/duplication only).    10/17/2021 Definitive Surgery   FINAL MICROSCOPIC DIAGNOSIS:   A. COLON, RECTOSIGMOID, RESECTION:  -  Invasive well differentiated adenocarcinoma (6 cm in greatest  dimension) with extension through submucosa, muscularis propria and mesenteric/subserosal adipose tissue with abutment but not definitive involvement of the serosal membrane.  -  Focal evidence of precursor lesion (i.e. tubular adenoma)  -  Margins negative (proximal 5 cm, distal 7 cm and mesenteric 6 cm)  -  Negative for lymphovascular and perineural invasion.  -  Focal high-grade tumor budding present.  -  1 of 22 lymph nodes  positive for malignancy  pT3 pN1 pM n/a   B. FINAL DISTAL MARGIN:  -   Unremarkable colonic mucosa, negative for malignancy.    ADDENDUM:  Mismatch Repair Protein (IHC)  SUMMARY INTERPRETATION: NORMAL   10/17/2021 Cancer Staging   Staging form: Colon and Rectum, AJCC 8th Edition - Pathologic stage from 10/17/2021: Stage IIIB (pT3, pN1a, cM0) - Signed by Malachy Mood, MD on 11/12/2021 Total positive nodes: 1 Histologic grading system: 4 grade system Histologic grade (G): G2 Residual tumor (R): R0 - None   11/19/2021 -  Chemotherapy   Patient is on Treatment Plan : COLORECTAL Xelox (Capeox) q21d        INTERVAL HISTORY:  Orion Hackbart is here for a follow up of colon cancer . He was last seen by PA-C Cassie. on 06/28/2022. He presents to the clinic accompanied by wife. Pt state that he is still having head ache for about 1 month. Sunlight bothers him but not inside lights. Pt still have some residual side effect from chemo, far as fatigue and been out in the sun.       All other systems were reviewed with the patient and are negative.  MEDICAL HISTORY:  Past Medical History:  Diagnosis Date   Allergy    Anemia    Anxiety    Bronchitis    Cancer (HCC)    Depression     SURGICAL HISTORY: Past Surgical History:  Procedure Laterality Date   ANTERIOR CRUCIATE LIGAMENT (ACL) REVISION Left    FLEXIBLE SIGMOIDOSCOPY N/A 10/17/2021   Procedure: FLEXIBLE SIGMOIDOSCOPY, INTRAOPERATIVE ASSESSMENT OF PERFUSION;  Surgeon: Andria Meuse,  MD;  Location: [facility];  Service: General;  Laterality: N/A;   XI ROBOTIC ASSISTED LOWER ANTERIOR RESECTION N/A 10/17/2021   Procedure: XI ROBOTIC ASSISTED LOWER ANTERIOR RESECTION;  Surgeon: Andria Meuse, MD;  Location: [facility];  Service: General;  Laterality: N/A;  GEN AND TAP BLOCK    I have reviewed the social history and family history with the patient and they are unchanged from previous note.  ALLERGIES:  has No Known  Allergies.  MEDICATIONS:  Current Outpatient Medications  Medication Sig Dispense Refill   Acetaminophen (TYLENOL PO) Take 1,250 mg by mouth every 8 (eight) hours as needed.     butalbital-acetaminophen-caffeine (FIORICET) 50-325-40 MG tablet Take 1 tablet by mouth every 6 (six) hours as needed for headache. 14 tablet 0   hydrOXYzine (VISTARIL) 25 MG capsule Take 1 capsule (25 mg total) by mouth every 8 (eight) hours as needed. 30 capsule 0   ibuprofen (ADVIL) 800 MG tablet Take 800 mg by mouth every 8 (eight) hours as needed.     ondansetron (ZOFRAN) 8 MG tablet Take 1 tablet (8 mg total) by mouth 2 (two) times daily as needed for refractory nausea / vomiting. Start on day 3 after chemotherapy. 30 tablet 1   zinc gluconate 50 MG tablet Take 50 mg by mouth daily. (Patient not taking: Reported on 09/24/2022)     No current facility-administered medications for this visit.    PHYSICAL EXAMINATION: ECOG PERFORMANCE STATUS: 0 - Asymptomatic  Vitals:   10/07/22 1421  BP: 130/87  Pulse: 89  Resp: 15  Temp: 98.1 F (36.7 C)  SpO2: 98%   Wt Readings from Last 3 Encounters:  10/07/22 264 lb 3.2 oz (119.8 kg)  09/24/22 250 lb (113.4 kg)  09/13/22 259 lb 12.8 oz (117.8 kg)     GENERAL:alert, no distress and comfortable SKIN: skin color normal, no rashes or significant lesions EYES: normal, Conjunctiva are pink and non-injected, sclera clear  NEURO: alert & oriented x 3 with fluent speech  LABORATORY DATA:  I have reviewed the data as listed    Latest Ref Rng & Units 10/03/2022    9:12 AM 06/28/2022    9:11 AM 03/18/2022    9:08 AM  CBC  WBC 4.0 - 10.5 K/uL 7.7  7.3  6.1   Hemoglobin 13.0 - 17.0 g/dL 16.1  09.6  04.5   Hematocrit 39.0 - 52.0 % 42.1  41.8  38.9   Platelets 150 - 400 K/uL 245  238  211         Latest Ref Rng & Units 10/03/2022    9:12 AM 06/28/2022    9:11 AM 03/18/2022    9:08 AM  CMP  Glucose 70 - 99 mg/dL 86  96  409   BUN 6 - 20 mg/dL 20  17  17    Creatinine  0.61 - 1.24 mg/dL 8.11  9.14  7.82   Sodium 135 - 145 mmol/L 139  139  137   Potassium 3.5 - 5.1 mmol/L 4.2  4.3  4.0   Chloride 98 - 111 mmol/L 105  106  106   CO2 22 - 32 mmol/L 30  28  27    Calcium 8.9 - 10.3 mg/dL 9.3  9.5  9.2   Total Protein 6.5 - 8.1 g/dL 8.1  7.6  7.8   Total Bilirubin 0.3 - 1.2 mg/dL 0.9  1.4  1.5   Alkaline Phos 38 - 126 U/L 62  60  62  AST 15 - 41 U/L 26  27  35   ALT 0 - 44 U/L 35  39  36       RADIOGRAPHIC STUDIES: I have personally reviewed the radiological images as listed and agreed with the findings in the report. No results found.    No orders of the defined types were placed in this encounter.  All questions were answered. The patient knows to call the clinic with any problems, questions or concerns. No barriers to learning was detected. The total time spent in the appointment was 25 minutes.     Malachy Mood, MD 10/07/2022   Carolin Coy, CMA, am acting as scribe for Malachy Mood, MD.   I have reviewed the above documentation for accuracy and completeness, and I agree with the above.

## 2022-10-10 ENCOUNTER — Other Ambulatory Visit: Payer: Self-pay

## 2022-10-11 ENCOUNTER — Ambulatory Visit (HOSPITAL_COMMUNITY): Payer: 59

## 2022-10-12 ENCOUNTER — Other Ambulatory Visit: Payer: Self-pay

## 2022-10-18 ENCOUNTER — Encounter: Payer: 59 | Admitting: Gastroenterology

## 2022-10-23 ENCOUNTER — Telehealth: Payer: Self-pay | Admitting: Gastroenterology

## 2022-10-23 MED ORDER — NA SULFATE-K SULFATE-MG SULF 17.5-3.13-1.6 GM/177ML PO SOLN: 1.0000 | Freq: Once | ORAL | 0 refills | Status: AC

## 2022-10-23 NOTE — Telephone Encounter (Signed)
Order for [employer] send to [facility] in [place]. Patients wife notified.

## 2022-10-23 NOTE — Telephone Encounter (Signed)
Patient's wife called stating patient's prep medication is not at [facility]. Requesting it be sent over again so he can prep for colonoscopy tomorrow 5/30. Please advise, thank you.

## 2022-10-24 ENCOUNTER — Ambulatory Visit (AMBULATORY_SURGERY_CENTER): Payer: 59 | Admitting: Gastroenterology

## 2022-10-24 ENCOUNTER — Encounter: Payer: Self-pay | Admitting: Gastroenterology

## 2022-10-24 VITALS — BP 124/70 | HR 77 | Temp 98.9°F | Resp 11 | Ht 74.0 in | Wt 250.0 lb

## 2022-10-24 DIAGNOSIS — C187 Malignant neoplasm of sigmoid colon: Secondary | ICD-10-CM

## 2022-10-24 DIAGNOSIS — D120 Benign neoplasm of cecum: Secondary | ICD-10-CM | POA: Diagnosis not present

## 2022-10-24 DIAGNOSIS — D125 Benign neoplasm of sigmoid colon: Secondary | ICD-10-CM

## 2022-10-24 DIAGNOSIS — Z08 Encounter for follow-up examination after completed treatment for malignant neoplasm: Secondary | ICD-10-CM | POA: Diagnosis present

## 2022-10-24 DIAGNOSIS — Z85038 Personal history of other malignant neoplasm of large intestine: Secondary | ICD-10-CM | POA: Diagnosis not present

## 2022-10-24 DIAGNOSIS — K635 Polyp of colon: Secondary | ICD-10-CM

## 2022-10-24 MED ORDER — SODIUM CHLORIDE 0.9 % IV SOLN
500.0000 mL | Freq: Once | INTRAVENOUS | Status: DC
Start: 2022-10-24 — End: 2022-10-24

## 2022-10-24 NOTE — Progress Notes (Signed)
Uneventful anesthetic. Report to pacu rn. Vss. Care resumed by rn. 

## 2022-10-24 NOTE — Progress Notes (Signed)
GASTROENTEROLOGY PROCEDURE H&P NOTE   Primary Care Physician: Mliss Sax, MD    Reason for Procedure:  Colon cancer surveillance  Plan:    Colonoscopy  Patient is appropriate for endoscopic procedure(s) in the ambulatory ([facility]) setting.  The nature of the procedure, as well as the risks, benefits, and alternatives were carefully and thoroughly reviewed with the patient. Ample time for discussion and questions allowed. The patient understood, was satisfied, and agreed to proceed.     HPI: Philip Clark is a 37 y.o. male who presents for colonoscopy for ongoing colon cancer surveillance.  Diagnosed with adenocarcinoma of the sigmoid colon, stage IIIb (T3, N1a) M0 in 08/2021, now s/p sigmoid resection and chemotherapy, presenting for ongoing surveillance.  -Colonoscopy 09/11/21 showed 6 cm sigmoid colon mass located 20-26 cm from anal verge. Biopsy confirmed adenocarcinoma.  2 other smaller adenoma was also removed. -staging CT 4/25 and MRI 4/27 showed suspicious mesenteric lymph nodes, no distant metastasis. -s/p surgical resection 10/17/21 with Dr. Cliffton Asters, path showed clear margins and one positive node -baseline CEA 11/13/21 was WNL. -given his stage IIIB disease, he completed 4 cycles adjuvant CAPEOX 11/19/21 - 02/03/22. He tolerated relatively well overall. -post-treatment CT CAP 03/22/22 showed NED. -Repeat CT 10/03/2022 with no evidence of cancer recurrence  Past Medical History:  Diagnosis Date   Allergy    Anemia    Anxiety    Bronchitis    Cancer (HCC)    Depression     Past Surgical History:  Procedure Laterality Date   ANTERIOR CRUCIATE LIGAMENT (ACL) REVISION Left    FLEXIBLE SIGMOIDOSCOPY N/A 10/17/2021   Procedure: FLEXIBLE SIGMOIDOSCOPY, INTRAOPERATIVE ASSESSMENT OF PERFUSION;  Surgeon: Andria Meuse, MD;  Location: [facility];  Service: General;  Laterality: N/A;   XI ROBOTIC ASSISTED LOWER ANTERIOR RESECTION N/A 10/17/2021   Procedure: XI ROBOTIC  ASSISTED LOWER ANTERIOR RESECTION;  Surgeon: Andria Meuse, MD;  Location: [facility];  Service: General;  Laterality: N/A;  GEN AND TAP BLOCK    Prior to Admission medications   Medication Sig Start Date End Date Taking? Authorizing Provider  Acetaminophen (TYLENOL PO) Take 1,250 mg by mouth every 8 (eight) hours as needed.    [provider]  butalbital-acetaminophen-caffeine (FIORICET) 380 [id number] MG tablet Take 1 tablet by mouth every 6 (six) hours as needed for headache. 09/13/22   Loyola Mast, MD  hydrOXYzine (VISTARIL) 25 MG capsule Take 1 capsule (25 mg total) by mouth every 8 (eight) hours as needed. 09/13/22   Loyola Mast, MD  ibuprofen (ADVIL) 800 MG tablet Take 800 mg by mouth every 8 (eight) hours as needed.    [provider]  ondansetron (ZOFRAN) 8 MG tablet Take 1 tablet (8 mg total) by mouth 2 (two) times daily as needed for refractory nausea / vomiting. Start on day 3 after chemotherapy. 11/12/21   Malachy Mood, MD  zinc gluconate 50 MG tablet Take 50 mg by mouth daily. Patient not taking: Reported on 09/24/2022    [provider]    Current Outpatient Medications  Medication Sig Dispense Refill   Acetaminophen (TYLENOL PO) Take 1,250 mg by mouth every 8 (eight) hours as needed.     butalbital-acetaminophen-caffeine (FIORICET) 50-325-40 MG tablet Take 1 tablet by mouth every 6 (six) hours as needed for headache. 14 tablet 0   hydrOXYzine (VISTARIL) 25 MG capsule Take 1 capsule (25 mg total) by mouth every 8 (eight) hours as needed. 30 capsule 0   ibuprofen (ADVIL) 800  MG tablet Take 800 mg by mouth every 8 (eight) hours as needed.     ondansetron (ZOFRAN) 8 MG tablet Take 1 tablet (8 mg total) by mouth 2 (two) times daily as needed for refractory nausea / vomiting. Start on day 3 after chemotherapy. 30 tablet 1   zinc gluconate 50 MG tablet Take 50 mg by mouth daily. (Patient not taking: Reported on 09/24/2022)     Current Facility-Administered  Medications  Medication Dose Route Frequency Provider Last Rate Last Admin   0.9 %  sodium chloride infusion  500 mL Intravenous Once Jacarra Bobak V, DO        Allergies as of 10/24/2022   (Not on File)    Family History  Problem Relation Age of Onset   Anxiety disorder Mother    Heart disease Father 61       MI x 2   Skin cancer Father        basal or squamous cell   Dementia Maternal Grandmother    Heart disease Maternal Grandfather 57       CAD with CABG   Bladder Cancer Maternal Grandfather        dx. 60s   Colon cancer Paternal Grandmother        dx. 60s/70s   Early death Paternal Grandfather 66       CAD/MI   Rectal cancer Neg Hx    Stomach cancer Neg Hx    Esophageal cancer Neg Hx    Colon polyps Neg Hx     Social History   Socioeconomic History   Marital status: Married    Spouse name: Not on file   Number of children: 2   Years of education: Not on file   Highest education level: Not on file  Occupational History   Occupation: Theatre stage manager  Tobacco Use   Smoking status: Never   Smokeless tobacco: Never  Vaping Use   Vaping Use: Never used  Substance and Sexual Activity   Alcohol use: Yes    Alcohol/week: 1.0 standard drink of alcohol    Types: 1 Cans of beer per week    Comment: occ   Drug use: No   Sexual activity: Yes  Other Topics Concern   Not on file  Social History Narrative   Not on file   Social Determinants of Health   Financial Resource Strain: Not on file  Food Insecurity: No Food Insecurity (06/28/2022)   Hunger Vital Sign    Worried About Running Out of Food in the Last Year: Never true    Ran Out of Food in the Last Year: Never true  Transportation Needs: No Transportation Needs (06/28/2022)   PRAPARE - Administrator, Civil Service (Medical): No    Lack of Transportation (Non-Medical): No  Physical Activity: Not on file  Stress: Not on file  Social Connections: Not on file  Intimate Partner Violence: Not on  file    Physical Exam: Vital signs in last 24 hours: @BP  131/74 (BP Location: Left Arm, Patient Position: Sitting, Cuff Size: Normal)   Pulse 78   Temp 98.9 F (37.2 C) (Temporal)   Ht 6\' 2"  (1.88 m)   Wt 250 lb (113.4 kg)   SpO2 98%   BMI 32.10 kg/m  GEN: NAD EYE: Sclerae anicteric ENT: MMM CV: Non-tachycardic Pulm: CTA b/l GI: Soft, NT/ND NEURO:  Alert & Oriented x 3   Doristine Locks, DO [facility]   10/24/2022 11:12 AM

## 2022-10-24 NOTE — Progress Notes (Signed)
Called to room to assist during endoscopic procedure.  Patient ID and intended procedure confirmed with present staff. Received instructions for my participation in the procedure from the performing physician.  

## 2022-10-24 NOTE — Op Note (Signed)
[facility] Patient Name: Philip Clark Procedure Date: 10/24/2022 11:15 AM MRN: [id number] Endoscopist: Doristine Locks , MD, [id number] Age: 37 Referring MD:  Date of Birth: 1985/08/30 Gender: Male Account #: [id number] Procedure:                Colonoscopy Indications:              High risk colon cancer surveillance: Personal                            history of colon cancer                           -Colonoscopy 09/11/21 showed 6 cm sigmoid colon mass                            located 20-26 cm from anal verge. Biopsy confirmed                            adenocarcinoma. 2 other smaller adenoma was also                            removed.                           -staging CT 4/25 and MRI 4/27 showed suspicious                            mesenteric lymph nodes, no distant metastasis.                           -s/p surgical resection 10/17/21 with Dr. Cliffton Asters,                            path showed clear margins and one positive node                           -baseline CEA 11/13/21 was WNL.                           -given his stage IIIB disease, he completed 4                            cycles adjuvant CAPEOX 11/19/21 - 02/03/22. He                            tolerated relatively well overall.                           -post-treatment CT CAP 03/22/22 showed NED.                           -Repeat CT 10/03/2022 with no evidence of cancer                            recurrence  He presents today for continued surveillance. No                            recent GI symptoms. Normal CBC, iron panel, and CEA                            earlier this month. Medicines:                Monitored Anesthesia Care Procedure:                Pre-Anesthesia Assessment:                           - Prior to the procedure, a History and Physical                            was performed, and patient medications and                            allergies were reviewed. The  patient's tolerance of                            previous anesthesia was also reviewed. The risks                            and benefits of the procedure and the sedation                            options and risks were discussed with the patient.                            All questions were answered, and informed consent                            was obtained. Prior Anticoagulants: The patient has                            taken no anticoagulant or antiplatelet agents. ASA                            Grade Assessment: II - A patient with mild systemic                            disease. After reviewing the risks and benefits,                            the patient was deemed in satisfactory condition to                            undergo the procedure.                           After obtaining informed consent, the colonoscope  was passed under direct vision. Throughout the                            procedure, the patient's blood pressure, pulse, and                            oxygen saturations were monitored continuously. The                            CF [id number] #[id number] was introduced through the anus                            and advanced to the the terminal ileum. The                            colonoscopy was performed without difficulty. The                            patient tolerated the procedure well. The quality                            of the bowel preparation was excellent. The                            terminal ileum, ileocecal valve, appendiceal                            orifice, and rectum were photographed. Scope In: 11:22:01 AM Scope Out: 11:36:05 AM Scope Withdrawal Time: 0 hours 12 minutes 6 seconds  Total Procedure Duration: 0 hours 14 minutes 4 seconds  Findings:                 The perianal and digital rectal examinations were                            normal.                           A 2 mm polyp was found in the cecum. The  polyp was                            sessile. The polyp was removed with a cold snare.                            Resection and retrieval were complete. Estimated                            blood loss was minimal.                           A 3 mm polyp was found in the sigmoid colon. The                            polyp was sessile. The polyp was removed with  a                            cold snare. Resection and retrieval were complete.                            Estimated blood loss was minimal.                           There was evidence of a prior end-to-end                            colo-colonic anastomosis in the recto-sigmoid                            colon, located approximately 18 cm from the anal                            verge. This was patent and was characterized by                            healthy appearing mucosa using both white light and                            narrow band imaging (NBI). The anastomosis was                            traversed.                           Non-bleeding internal hemorrhoids were found during                            retroflexion. The hemorrhoids were small.                           The terminal ileum appeared normal. Complications:            No immediate complications. Estimated Blood Loss:     Estimated blood loss was minimal. Impression:               - One 2 mm polyp in the cecum, removed with a cold                            snare. Resected and retrieved.                           - One 3 mm polyp in the sigmoid colon, removed with                            a cold snare. Resected and retrieved.                           - Patent end-to-end colo-colonic anastomosis,  characterized by healthy appearing mucosa.                           - Non-bleeding internal hemorrhoids.                           - The examined portion of the ileum was normal.                           - The GI Genius (intelligent  endoscopy module),                            computer-aided polyp detection system powered by AI                            was utilized to detect colorectal polyps through                            enhanced visualization during colonoscopy. Recommendation:           - Patient has a contact number available for                            emergencies. The signs and symptoms of potential                            delayed complications were discussed with the                            patient. Return to normal activities tomorrow.                            Written discharge instructions were provided to the                            patient.                           - Resume previous diet.                           - Continue present medications.                           - Await pathology results.                           - Repeat colonoscopy in 3 years for surveillance.                           - Return to GI office PRN. Doristine Locks, MD 10/24/2022 11:43:49 AM

## 2022-10-24 NOTE — Patient Instructions (Signed)
Resume previous diet Continue present medications Await pathology results Repeat colonoscopy in 3 years, you will receive a letter at that time when you are due for the procedure.   Please call us at [phone] if you have a change in bowel habits, change in family history of colo-rectal cancer, rectal bleeding or other GI concern before that time. Handouts/information given for polyps and hemorrhoids  YOU HAD AN ENDOSCOPIC PROCEDURE TODAY AT THE [facility]:   Refer to the procedure report that was given to you for any specific questions about what was found during the examination.  If the procedure report does not answer your questions, please call your gastroenterologist to clarify.  If you requested that your care partner not be given the details of your procedure findings, then the procedure report has been included in a sealed envelope for you to review at your convenience later.  YOU SHOULD EXPECT: Some feelings of bloating in the abdomen. Passage of more gas than usual.  Walking can help get rid of the air that was put into your GI tract during the procedure and reduce the bloating. If you had a lower endoscopy (such as a colonoscopy or flexible sigmoidoscopy) you may notice spotting of blood in your stool or on the toilet paper. If you underwent a bowel prep for your procedure, you may not have a normal bowel movement for a few days.  Please Note:  You might notice some irritation and congestion in your nose or some drainage.  This is from the oxygen used during your procedure.  There is no need for concern and it should clear up in a day or so.  SYMPTOMS TO REPORT IMMEDIATELY:  Following lower endoscopy (colonoscopy):  Excessive amounts of blood in the stool  Significant tenderness or worsening of abdominal pains  Swelling of the abdomen that is new, acute  Fever of 100F or higher  For urgent or emergent issues, a gastroenterologist can be reached at any hour by  calling [phone]. Do not use MyChart messaging for urgent concerns.    DIET:  We do recommend a small meal at first, but then you may proceed to your regular diet.  Drink plenty of fluids but you should avoid alcoholic beverages for 24 hours.  ACTIVITY:  You should plan to take it easy for the rest of today and you should NOT DRIVE or use heavy machinery until tomorrow (because of the sedation medicines used during the test).    FOLLOW UP: Our staff will call the number listed on your records the next business day following your procedure.  We will call around 7:15- 8:00 am to check on you and address any questions or concerns that you may have regarding the information given to you following your procedure. If we do not reach you, we will leave a message.     If any biopsies were taken you will be contacted by phone or by letter within the next 1-3 weeks.  Please call us at [phone] if you have not heard about the biopsies in 3 weeks.    SIGNATURES/CONFIDENTIALITY: You and/or your care partner have signed paperwork which will be entered into your electronic medical record.  These signatures attest to the fact that that the information above on your After Visit Summary has been reviewed and is understood.  Full responsibility of the confidentiality of this discharge information lies with you and/or your care-partner.

## 2022-10-25 ENCOUNTER — Telehealth: Payer: Self-pay

## 2022-10-25 NOTE — Telephone Encounter (Signed)
  Follow up Call-     10/24/2022   10:32 AM 10/24/2022   10:27 AM 09/11/2021   12:59 PM  Call back number  Post procedure Call Back phone  # [phone]  [phone]  Permission to leave phone message  Yes Yes     Patient questions:  Do you have a fever, pain , or abdominal swelling? No. Pain Score  0 *  Have you tolerated food without any problems? Yes.    Have you been able to return to your normal activities? Yes.    Do you have any questions about your discharge instructions: Diet   No. Medications  No. Follow up visit  No.  Do you have questions or concerns about your Care? No.  Actions: * If pain score is 4 or above: No action needed, pain <4.

## 2022-12-10 IMAGING — MR MR PELVIS WO/W CM
10 of 11 series · 39 of 48 positions shown · IV contrast (gadavist)
Comparison: Abdominopelvic CT 09/18/2021.

CLINICAL DATA: Malignant partially obstructing sigmoid colon cancer
on colonoscopy and prior CT. Preoperative planning.

EXAM:
MRI PELVIS WITHOUT AND WITH CONTRAST
TECHNIQUE: Multiplanar multisequence MR imaging of the pelvis was performed
both before and after administration of intravenous contrast.
CONTRAST:  10mL GADAVIST GADOBUTROL 1 MMOL/ML IV SOLN

[Series 2: T2 · axial · 5.0mm · 0.99mm/px · z∈[-119,+115]mm · 4 of 40 slices shown (1 of 6)]
[im 1/40]
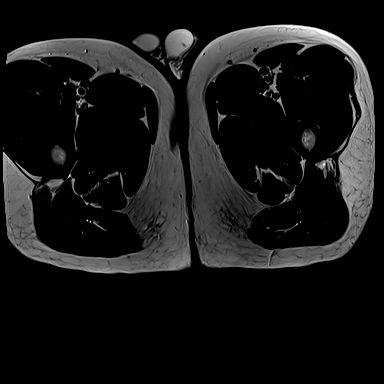
[im 14/40]
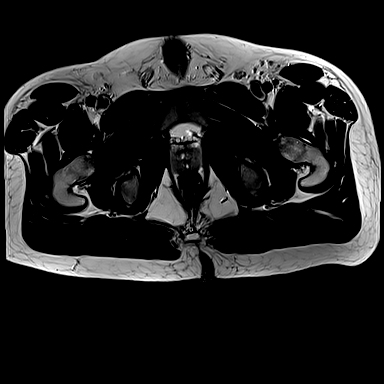
[im 27/40]
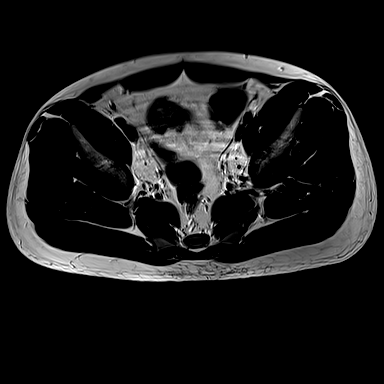
[im 40/40]
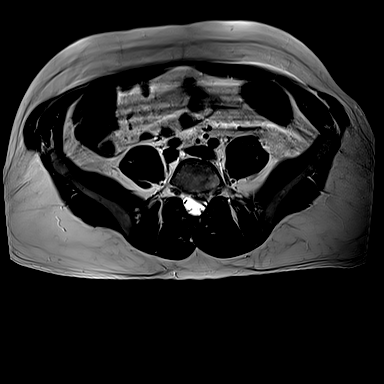

[Series 3: T2 · coronal · 3.5mm · 1.17mm/px · 4 of 47 slices shown (2 of 6)]
[im 1/47]
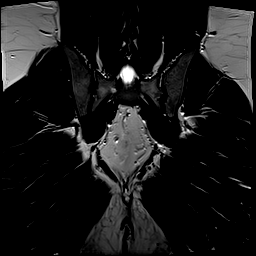
[im 16/47]
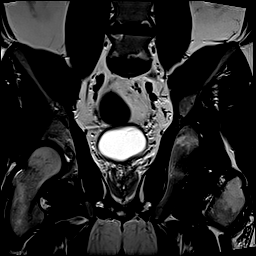
[im 31/47]
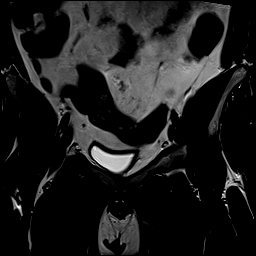
[im 47/47]
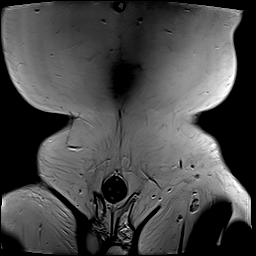

[Series 4: T2 · sagittal · 3.0mm · 0.74mm/px · 5 of 60 slices shown (3 of 6)]
[im 1/60]
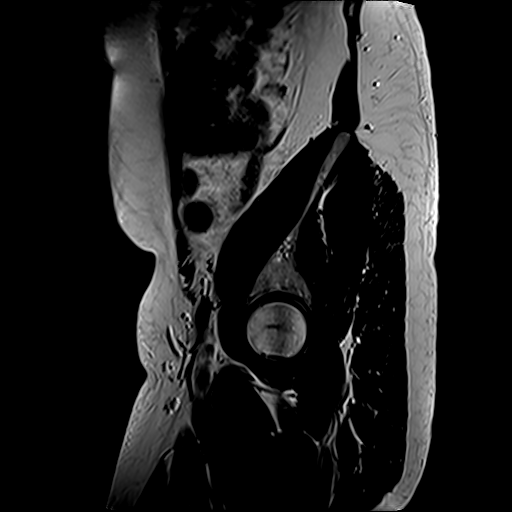
[im 15/60]
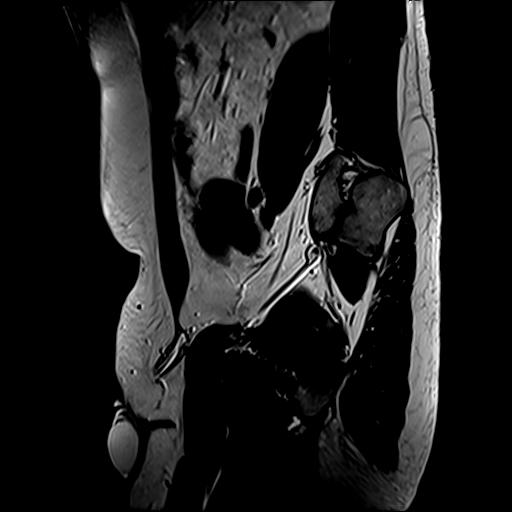
[im 30/60]
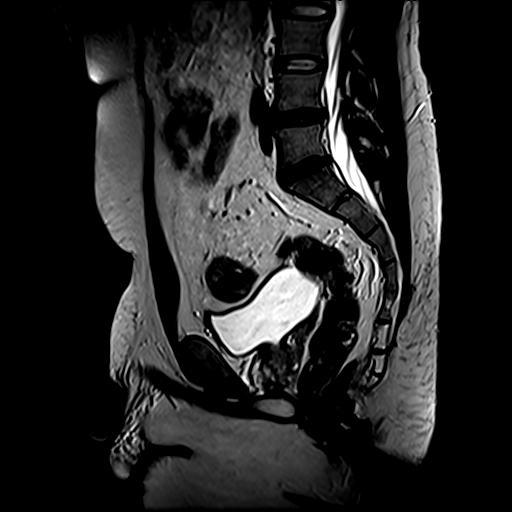
[im 45/60]
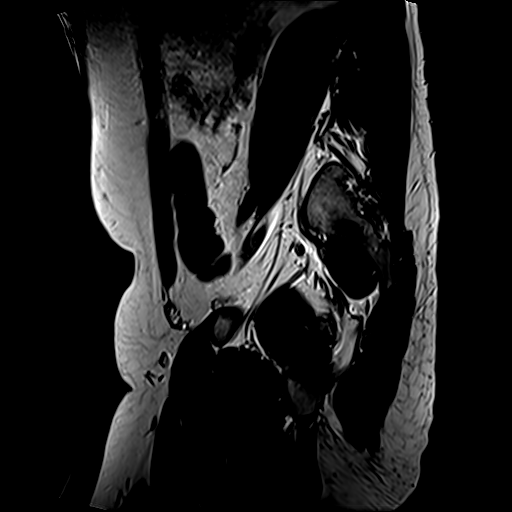
[im 60/60]
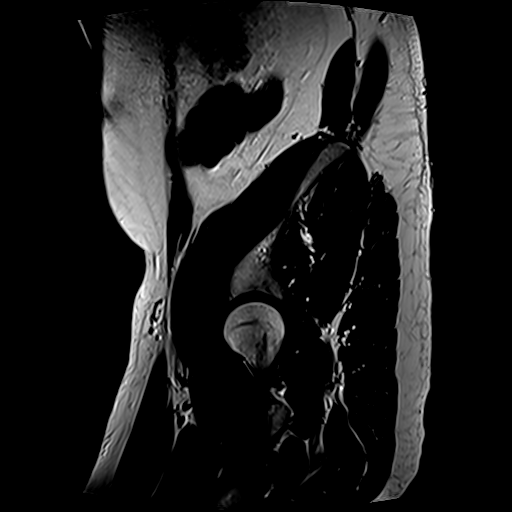

[Series 5: T2 · axial · 5.0mm · 0.99mm/px · z∈[-119,+115]mm · 3 of 40 slices shown (4 of 6)]
[im 1/40]
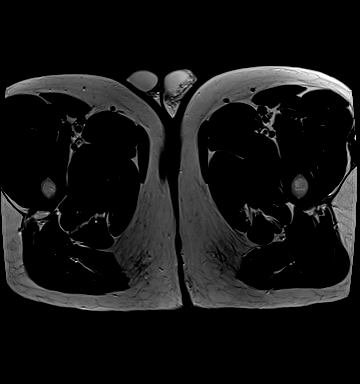
[im 20/40]
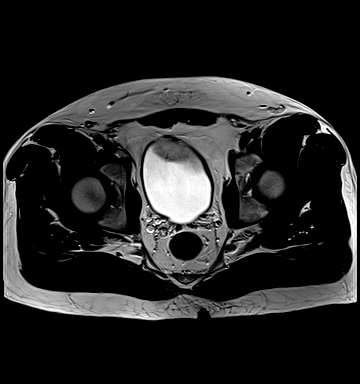
[im 40/40]
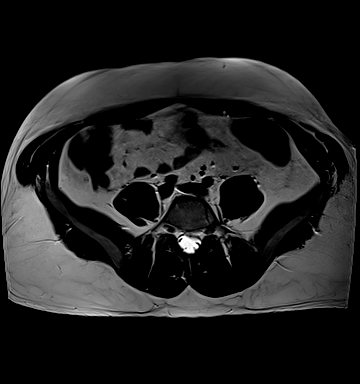

[Series 6: T2 · axial · 3.0mm · 0.94mm/px · z∈[-56,+97]mm · 4 of 52 slices shown (5 of 6)]
[im 1/52]
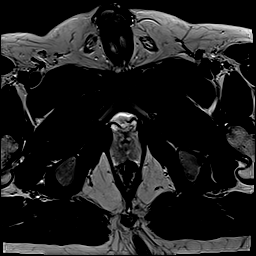
[im 18/52]
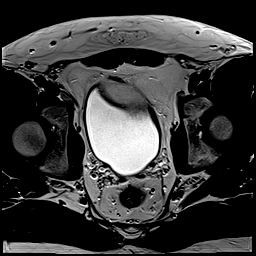
[im 35/52]
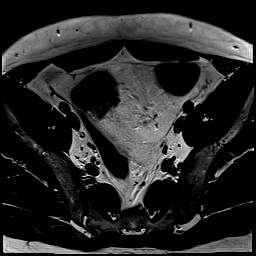
[im 52/52]
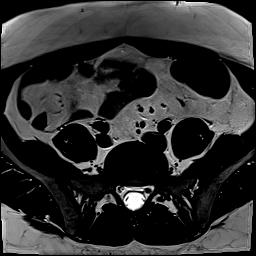

[Series 8: DWI · axial · 5.0mm · 1.48mm/px · z∈[-77,+133]mm · 6 of 72 slices shown (1 of 2)]
[im 1/72]
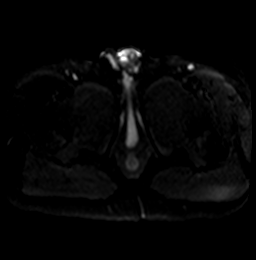
[im 15/72]
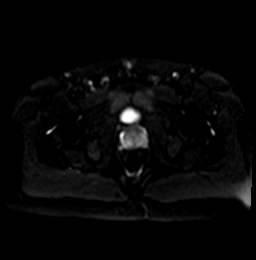
[im 29/72]
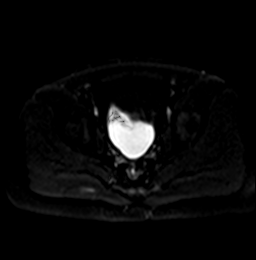
[im 43/72]
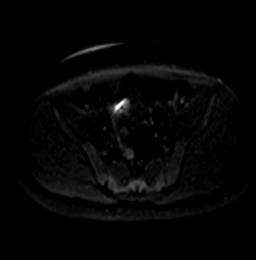
[im 57/72]
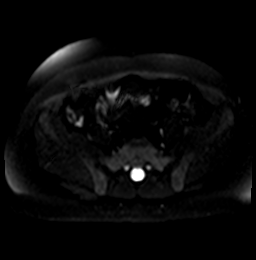
[im 72/72]
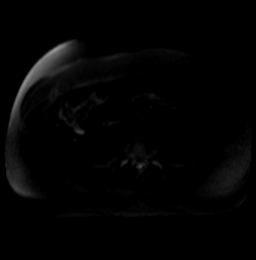

[Series 9: DWI · axial · 5.0mm · 1.48mm/px · z∈[-77,+133]mm · 3 of 36 slices shown (2 of 2)]
[im 1/36]
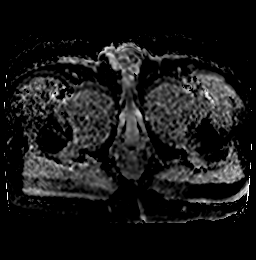
[im 18/36]
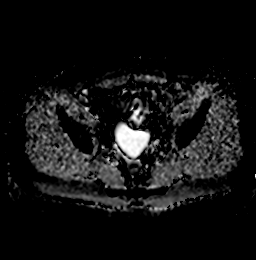
[im 36/36]
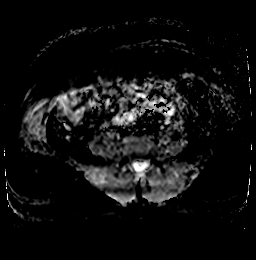

[Series 10: T1 fat-sat post-contrast · axial · 3.5mm · 0.68mm/px · z∈[-50,+111]mm · 4 of 47 slices shown (1 of 2)]
[im 1/47]
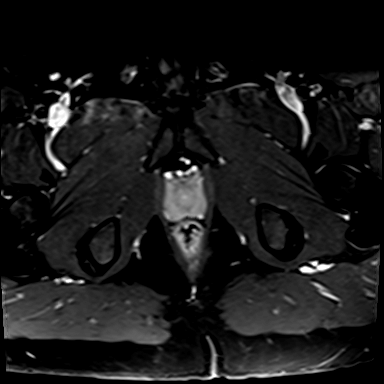
[im 16/47]
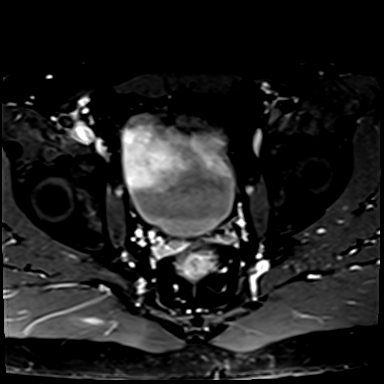
[im 31/47]
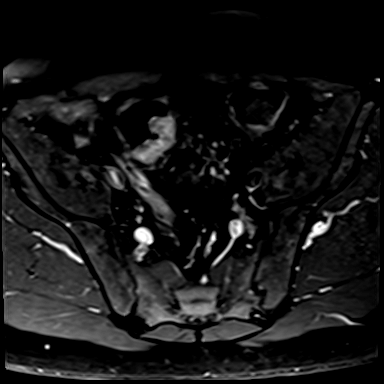
[im 47/47]
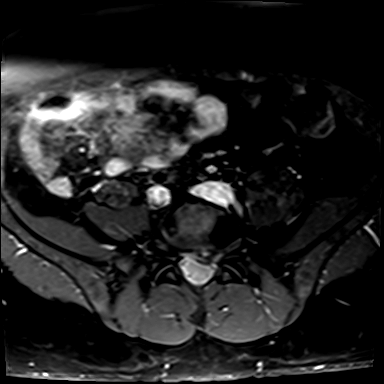

[Series 11: T2 · coronal · 3.5mm · 0.94mm/px · 4 of 47 slices shown (6 of 6)]
[im 1/47]
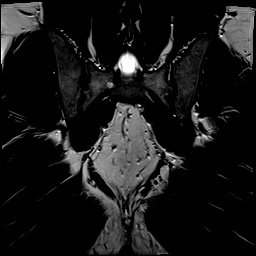
[im 16/47]
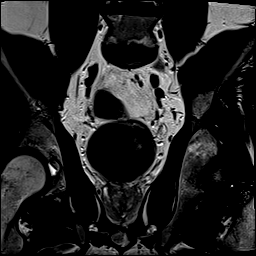
[im 31/47]
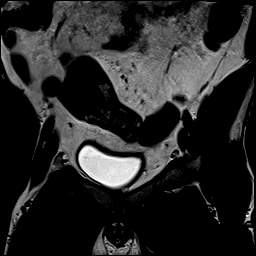
[im 47/47]
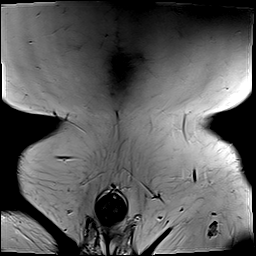

[Series 12: T1 fat-sat post-contrast · coronal · 3.5mm · 0.68mm/px · 2 of 47 slices shown (2 of 2)]
[im 1/47]
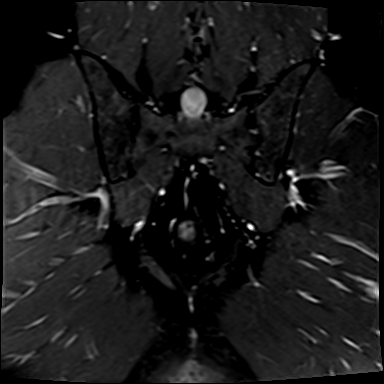
[im 16/47]
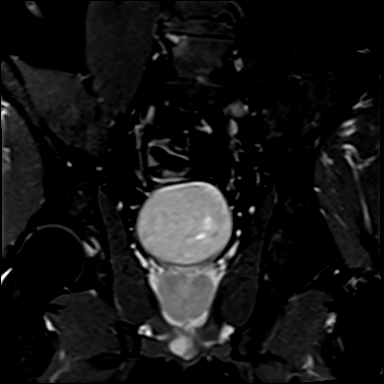

[39 of 48 positions shown; findings below may reference images not displayed]

FINDINGS: Urinary Tract: The visualized distal ureters and bladder appear
unremarkable.

Bowel: Irregular, nearly circumferential mass involving the mid
sigmoid colon measures up to 5.9 cm in length on coronal image [DATE]
and moderately constricts the lumen. No evidence of bowel
obstruction or perforation. No distinct subserosal extension of
tumor. The rectum appears unremarkable.

Vascular/Lymphatic: As seen on CT, there are numerous prominent
lymph nodes in the sigmoid mesentery measuring up to 7 mm in
diameter, suspicious for early nodal metastases. No significant
vascular findings.

Reproductive: Prostate gland and seminal vesicles appear
unremarkable.

Other: No ascites or peritoneal nodularity.

Musculoskeletal: No acute or worrisome osseous findings. Lower
lumbar spondylosis with a broad-based left paracentral disc
protrusion at L5-S1 causing left S1 nerve root encroachment. Disc
bulging is also present at L4-5.
IMPRESSION: 1. Partially obstructing malignancy of the mid sigmoid colon with
prominent sigmoid mesenteric lymph nodes suspicious for early nodal
metastases.
2. No evidence bowel obstruction, perforation, ascites or peritoneal
nodularity.
3. Left paracentral disc protrusion at L5-S1 with resulting left S1
nerve root encroachment.

## 2023-01-03 ENCOUNTER — Other Ambulatory Visit: Payer: Self-pay

## 2023-01-03 DIAGNOSIS — C187 Malignant neoplasm of sigmoid colon: Secondary | ICD-10-CM

## 2023-01-05 NOTE — Assessment & Plan Note (Deleted)
G1, stage IIIB p(T3, N1a) cM0, stage IIIB, MSS -initially developed abdominal pain/cramping in 02/2021 and rectal bleeding since 05/2021. Colonoscopy 09/11/21 showed 6 cm sigmoid colon mass located 20-26 cm from anal verge. Biopsy confirmed adenocarcinoma. -staging CT 4/25 and MRI 4/27 showed suspicious mesenteric lymph nodes, no distant metastasis. -s/p surgical resection 10/17/21 with Dr. Cliffton Asters, path showed clear margins and one positive node -baseline CEA 11/13/21 was WNL. -given his stage IIIB disease, he completed 4 cycles adjuvant CAPEOX 11/19/21 - 02/03/22. He tolerated relatively well overall. -post-treatment CT CAP 03/22/22 showed NED. -he is clinically doing very well with minimal remaining side effects.  -his surveillance CT on 10/03/2022 showed surgical change, no evidence of cancer recurrence.  Plan to repeat in next CT in November 2024.

## 2023-01-06 ENCOUNTER — Inpatient Hospital Stay: Payer: 59 | Admitting: Hematology

## 2023-01-06 ENCOUNTER — Inpatient Hospital Stay: Payer: 59 | Attending: Genetic Counselor

## 2023-01-06 DIAGNOSIS — C187 Malignant neoplasm of sigmoid colon: Secondary | ICD-10-CM

## 2023-01-08 ENCOUNTER — Encounter: Payer: Self-pay | Admitting: Family Medicine

## 2023-01-11 ENCOUNTER — Other Ambulatory Visit: Payer: Self-pay

## 2023-01-12 ENCOUNTER — Other Ambulatory Visit: Payer: Self-pay

## 2023-01-14 ENCOUNTER — Ambulatory Visit: Payer: 59 | Admitting: Family Medicine

## 2023-01-14 ENCOUNTER — Encounter: Payer: Self-pay | Admitting: Family Medicine

## 2023-01-14 VITALS — BP 124/86 | HR 80 | Temp 98.3°F | Ht 74.0 in | Wt 258.6 lb

## 2023-01-14 DIAGNOSIS — R519 Headache, unspecified: Secondary | ICD-10-CM

## 2023-01-14 DIAGNOSIS — C187 Malignant neoplasm of sigmoid colon: Secondary | ICD-10-CM | POA: Diagnosis not present

## 2023-01-14 MED ORDER — PREDNISONE 10 MG (48) PO TBPK
ORAL_TABLET | ORAL | 0 refills | Status: AC
Start: 2023-01-14 — End: ?

## 2023-01-14 NOTE — Progress Notes (Signed)
Established Patient Office Visit   Subjective:  Patient ID: Philip Clark, male    DOB: [birth year]  Age: 37 y.o. MRN: [id number]  Chief Complaint  Patient presents with   Headache    Headaches x 5 months. Pt states pain starts at the back of neck to the top of head behind both eyes.     Headache  Pertinent negatives include no abdominal pain, blurred vision, eye redness, tingling or weakness.   Encounter Diagnoses  Name Primary?   Nonintractable headache, unspecified chronicity pattern, unspecified headache type Yes   Adenocarcinoma of sigmoid colon (HCC)    Follow-up for ongoing headaches.  He describes it as a pressure headache.  It is on the right side of his head.  It starts in the back of moves forward.  He denies prodromal symptoms.  He denies nausea or vomiting.  He does become light sensitive.  His headache seems to be dull and ongoing.  He denies sinus symptoms such as rhinorrhea postnasal drip sneezing facial pressure or teeth pain.  No fevers or chills.  No rashes.  His mother has migraines.  He had headaches as a child but does not feel as though they were migraines.  His oncologist ordered an MRI of his brain and approval was denied.  He has tried his wife's Zomig which did not help.  Recently tried Fioricet provided by another MD which also did not help.  He has taken prednisone in the past and had no troubles with it.   Review of Systems  Constitutional: Negative.   HENT: Negative.    Eyes:  Negative for blurred vision, discharge and redness.  Respiratory: Negative.    Cardiovascular: Negative.   Gastrointestinal:  Negative for abdominal pain.  Genitourinary: Negative.   Musculoskeletal: Negative.  Negative for myalgias.  Skin:  Negative for rash.  Neurological:  Positive for headaches. Negative for tingling, loss of consciousness and weakness.  Endo/Heme/Allergies:  Negative for polydipsia.      10/02/2021    3:09 PM 10/02/2021    2:06 PM 07/02/2021    2:38 PM   Depression screen PHQ 2/9  Decreased Interest 1 0 0  Down, Depressed, Hopeless 1 1 0  PHQ - 2 Score 2 1 0  Altered sleeping 1    Tired, decreased energy 1    Change in appetite 1    Feeling bad or failure about yourself  0    Trouble concentrating 0    Moving slowly or fidgety/restless 0    Suicidal thoughts 0    PHQ-9 Score 5    Difficult doing work/chores Not difficult at all        Current Outpatient Medications:    Acetaminophen (TYLENOL PO), Take 1,250 mg by mouth every 8 (eight) hours as needed., Disp: , Rfl:    hydrOXYzine (VISTARIL) 25 MG capsule, Take 1 capsule (25 mg total) by mouth every 8 (eight) hours as needed., Disp: 30 capsule, Rfl: 0   ibuprofen (ADVIL) 800 MG tablet, Take 800 mg by mouth every 8 (eight) hours as needed., Disp: , Rfl:    predniSONE (STERAPRED UNI-PAK 48 TAB) 10 MG (48) TBPK tablet, Please instruct to 12-day Dosepak, Disp: 48 tablet, Rfl: 0   butalbital-acetaminophen-caffeine (FIORICET) 50-325-40 MG tablet, Take 1 tablet by mouth every 6 (six) hours as needed for headache. (Patient not taking: Reported on 01/14/2023), Disp: 14 tablet, Rfl: 0   ondansetron (ZOFRAN) 8 MG tablet, Take 1 tablet (8 mg total) by mouth 2 (two)  times daily as needed for refractory nausea / vomiting. Start on day 3 after chemotherapy. (Patient not taking: Reported on 01/14/2023), Disp: 30 tablet, Rfl: 1   zinc gluconate 50 MG tablet, Take 50 mg by mouth daily. (Patient not taking: Reported on 09/24/2022), Disp: , Rfl:    Objective:     BP 124/86   Pulse 80   Temp 98.3 F (36.8 C)   Ht 6\' 2"  (1.88 m)   Wt 258 lb 9.6 oz (117.3 kg)   SpO2 96%   BMI 33.20 kg/m    Physical Exam Constitutional:      General: He is not in acute distress.    Appearance: Normal appearance. He is not ill-appearing, toxic-appearing or diaphoretic.  HENT:     Head: Normocephalic and atraumatic.     Right Ear: External ear normal.     Left Ear: External ear normal.     Mouth/Throat:      Mouth: Mucous membranes are moist.     Pharynx: Oropharynx is clear. No oropharyngeal exudate or posterior oropharyngeal erythema.  Eyes:     General: No visual field deficit or scleral icterus.       Right eye: No discharge.        Left eye: No discharge.     Extraocular Movements: Extraocular movements intact.     Conjunctiva/sclera: Conjunctivae normal.     Pupils: Pupils are equal, round, and reactive to light.  Cardiovascular:     Rate and Rhythm: Normal rate and regular rhythm.  Pulmonary:     Effort: Pulmonary effort is normal. No respiratory distress.     Breath sounds: Normal breath sounds.  Musculoskeletal:     Cervical back: No rigidity or tenderness.  Skin:    General: Skin is warm and dry.  Neurological:     Mental Status: He is alert and oriented to person, place, and time.     Cranial Nerves: Cranial nerves 2-12 are intact. No cranial nerve deficit, dysarthria or facial asymmetry.  Psychiatric:        Mood and Affect: Mood normal.        Behavior: Behavior normal.      No results found for any visits on 01/14/23.    The ASCVD Risk score (Arnett DK, et al., 2019) failed to calculate for the following reasons:   The 2019 ASCVD risk score is only valid for ages 42 to 2    Assessment & Plan:   Nonintractable headache, unspecified chronicity pattern, unspecified headache type -     predniSONE; Please instruct to 12-day Dosepak  Dispense: 48 tablet; Refill: 0 -     Ambulatory referral to Neurology  Adenocarcinoma of sigmoid colon [facility])    Return if symptoms worsen or fail to improve.    Mliss Sax, MD

## 2023-01-15 ENCOUNTER — Other Ambulatory Visit: Payer: Self-pay

## 2023-01-20 ENCOUNTER — Encounter: Payer: Self-pay | Admitting: Hematology

## 2023-01-21 ENCOUNTER — Other Ambulatory Visit: Payer: Self-pay

## 2023-01-31 NOTE — Progress Notes (Unsigned)
[facility]   [phone] [phone]   Clinic Follow up Note   Patient Care Team: Mliss Sax, MD as PCP - General (Family Medicine) Thomasene Ripple, DO as PCP - Cardiology (Cardiology) Malachy Mood, MD as Consulting Physician (Oncology)  Date of Service:  02/03/2023  CHIEF COMPLAINT: f/u of colon cancer     CURRENT THERAPY:  Surveillance  ASSESSMENT:  Philip Clark is a 37 y.o. male with   Adenocarcinoma of sigmoid colon (HCC) G1, stage IIIB p(T3, N1a) cM0, stage IIIB, MSS -initially developed abdominal pain/cramping in 02/2021 and rectal bleeding since 05/2021. Colonoscopy 09/11/21 showed 6 cm sigmoid colon mass located 20-26 cm from anal verge. Biopsy confirmed adenocarcinoma. -staging CT 4/25 and MRI 4/27 showed suspicious mesenteric lymph nodes, no distant metastasis. -s/p surgical resection 10/17/21 with Dr. Cliffton Asters, path showed clear margins and one positive node -baseline CEA 11/13/21 was WNL. -given his stage IIIB disease, he completed 4 cycles adjuvant CAPEOX 11/19/21 - 02/03/22. He tolerated relatively well overall. -post-treatment CT CAP 03/22/22 showed NED. -he is clinically doing very well with minimal remaining side effects.  -his surveillance CT on 10/03/2022 showed surgical change, no evidence of cancer recurrence.  Plan to repeat in next CT in November 2024. -He is clinically doing well, lab reviewed, exam unremarkable, there is no clinical concern for recurrence.  Scheduled   PLAN: -lab reviewed -CMP-pending -repeat CT in 8 months  -will order CT on next visit -lab and f/u in 4 months.   SUMMARY OF ONCOLOGIC HISTORY: Oncology History Overview Note   Cancer Staging  Adenocarcinoma of sigmoid colon [facility]) Staging form: Colon and Rectum, AJCC 8th Edition - Pathologic stage from 10/17/2021: Stage IIIB (pT3, pN1a, cM0) - Signed by Malachy Mood, MD on 11/12/2021    Adenocarcinoma of sigmoid colon (HCC)  09/11/2021 Procedure    Colonoscopy, Dr. Barron Alvine  Impression - One 8 mm polyp in the ascending colon, removed with a cold snare. Resected and retrieved. - One 4 mm polyp in the sigmoid colon, removed with a cold snare. Resected and retrieved. - Malignant partially obstructing tumor in the sigmoid colon, located 20-26 cm from the anal verge. This was traversed. Biopsied. Tattoo placed 1-2 cm distal to the lesion. - One 2 mm polyp in the rectum, removed with a cold biopsy forceps. Resected and retrieved. - The distal rectum and anal verge are normal on retroflexion view. - The examined portion of the ileum was normal.   09/11/2021 Initial Biopsy   Diagnosis 1. Duodenum, Biopsy - DUODENAL MUCOSA WITH NORMAL VILLOUS ARCHITECTURE. - NO VILLOUS ATROPHY OR INCREASED INTRAEPITHELIAL LYMPHOCYTES. 2. Duodenum, Biopsy, bulb - DUODENAL BULB WITH PEPTIC INJURY. 3. Ascending Colon Polyp - MULTIPLE FRAGMENTS OF TUBULAR ADENOMA WITHOUT HIGH GRADE DYSPLASIA. 4. Sigmoid Colon Polyp - INVASIVE COLONIC ADENOCARCINOMA. - SEE MICROSCOPIC DESCRIPTION. 5. Colon, polyp(s), mass - INVASIVE COLONIC ADENOCARCINOMA. - SEE MICROSCOPIC DESCRIPTION. 6. Rectum, polyp(s) - HYPERPLASTIC POLYP. Microscopic Comment 4. and 5. The carcinoma in parts 4 and 5 have identical morphologic features.   09/18/2021 Imaging   EXAM: CT CHEST, ABDOMEN, AND PELVIS WITH CONTRAST  IMPRESSION: 1. Short segment asymmetric wall thickening of the sigmoid colon is consistent with patient's known sigmoid colon cancer. 2. Prominent lymph nodes in the sigmoid mesentery measure up to 6 mm in short axis, which likely reflect local nodal disease involvement. 3. No evidence of distant metastatic disease in the chest, abdomen or pelvis. 4. No evidence of high-grade bowel obstruction. However, there is a  large volume of formed stool throughout the colon   09/20/2021 Imaging   EXAM: MRI PELVIS WITHOUT AND WITH CONTRAST  IMPRESSION: 1. Partially obstructing  malignancy of the mid sigmoid colon with prominent sigmoid mesenteric lymph nodes suspicious for early nodal metastases. 2. No evidence bowel obstruction, perforation, ascites or peritoneal nodularity. 3. Left paracentral disc protrusion at L5-S1 with resulting left S1 nerve root encroachment.   10/02/2021 Initial Diagnosis   Adenocarcinoma of sigmoid colon (HCC)   10/09/2021 Genetic Testing   [facility] CancerNext-Expanded Panel was Negative. Report date is 10/26/2021.  The CancerNext-Expanded gene panel offered by [facility] ([facility]) and includes sequencing, rearrangement, and RNA analysis for the following 77 genes: AIP, ALK, APC, ATM, AXIN2, BAP1, BARD1, BLM, BMPR1A, BRCA1, BRCA2, BRIP1, CDC73, CDH1, CDK4, CDKN1B, CDKN2A, CHEK2, CTNNA1, DICER1, FANCC, FH, FLCN, GALNT12, KIF1B, LZTR1, MAX, MEN1, MET, MLH1, MSH2, MSH3, MSH6, MUTYH, NBN, NF1, NF2, NTHL1, PALB2, PHOX2B, PMS2, POT1, PRKAR1A, PTCH1, PTEN, RAD51C, RAD51D, RB1, RECQL, RET, SDHA, SDHAF2, SDHB, SDHC, SDHD, SMAD4, SMARCA4, SMARCB1, SMARCE1, STK11, SUFU, TMEM127, TP53, TSC1, TSC2, VHL and XRCC2 (sequencing and deletion/duplication); EGFR, EGLN1, HOXB13, KIT, MITF, PDGFRA, POLD1, and POLE (sequencing only); EPCAM and GREM1 (deletion/duplication only).    10/17/2021 Definitive Surgery   FINAL MICROSCOPIC DIAGNOSIS:   A. COLON, RECTOSIGMOID, RESECTION:  -  Invasive well differentiated adenocarcinoma (6 cm in greatest  dimension) with extension through submucosa, muscularis propria and mesenteric/subserosal adipose tissue with abutment but not definitive involvement of the serosal membrane.  -  Focal evidence of precursor lesion (i.e. tubular adenoma)  -  Margins negative (proximal 5 cm, distal 7 cm and mesenteric 6 cm)  -  Negative for lymphovascular and perineural invasion.  -  Focal high-grade tumor budding present.  -  1 of 22 lymph nodes positive for malignancy  pT3 pN1 pM n/a   B. FINAL DISTAL MARGIN:  -   Unremarkable colonic mucosa,  negative for malignancy.    ADDENDUM:  Mismatch Repair Protein (IHC)  SUMMARY INTERPRETATION: NORMAL   10/17/2021 Cancer Staging   Staging form: Colon and Rectum, AJCC 8th Edition - Pathologic stage from 10/17/2021: Stage IIIB (pT3, pN1a, cM0) - Signed by Malachy Mood, MD on 11/12/2021 Total positive nodes: 1 Histologic grading system: 4 grade system Histologic grade (G): G2 Residual tumor (R): R0 - None   11/19/2021 -  Chemotherapy   Patient is on Treatment Plan : COLORECTAL Xelox (Capeox) q21d     10/03/2022 Imaging   CT CHEST ABDOMEN PELVIS W CONTRAST   IMPRESSION: 1. Prior partial sigmoidectomy with rectosigmoid anastomotic sutures in the midline pelvis. No evidence of local recurrence. 2. No evidence of metastatic disease within the chest, abdomen, or pelvis.      INTERVAL HISTORY:  Nyron Schneller is here for a follow up of colon cancer. He was last seen by me on 10/07/2022. He presents to the clinic alone. Pt state that he is doing well. He still has some fatigue, but its tolerable.   All other systems were reviewed with the patient and are negative.  MEDICAL HISTORY:  Past Medical History:  Diagnosis Date   Allergy    Anemia    Anxiety    Bronchitis    Cancer (HCC)    Depression     SURGICAL HISTORY: Past Surgical History:  Procedure Laterality Date   ANTERIOR CRUCIATE LIGAMENT (ACL) REVISION Left    FLEXIBLE SIGMOIDOSCOPY N/A 10/17/2021   Procedure: FLEXIBLE SIGMOIDOSCOPY, INTRAOPERATIVE ASSESSMENT OF PERFUSION;  Surgeon: Andria Meuse, MD;  Location:  [facility];  Service: General;  Laterality: N/A;   XI ROBOTIC ASSISTED LOWER ANTERIOR RESECTION N/A 10/17/2021   Procedure: XI ROBOTIC ASSISTED LOWER ANTERIOR RESECTION;  Surgeon: Andria Meuse, MD;  Location: [facility];  Service: General;  Laterality: N/A;  GEN AND TAP BLOCK    I have reviewed the social history and family history with the patient and they are unchanged from previous note.  ALLERGIES:  has  no allergies on file.  MEDICATIONS:  Current Outpatient Medications  Medication Sig Dispense Refill   Acetaminophen (TYLENOL PO) Take 1,250 mg by mouth every 8 (eight) hours as needed.     butalbital-acetaminophen-caffeine (FIORICET) 50-325-40 MG tablet Take 1 tablet by mouth every 6 (six) hours as needed for headache. (Patient not taking: Reported on 01/14/2023) 14 tablet 0   hydrOXYzine (VISTARIL) 25 MG capsule Take 1 capsule (25 mg total) by mouth every 8 (eight) hours as needed. 30 capsule 0   ibuprofen (ADVIL) 800 MG tablet Take 800 mg by mouth every 8 (eight) hours as needed.     ondansetron (ZOFRAN) 8 MG tablet Take 1 tablet (8 mg total) by mouth 2 (two) times daily as needed for refractory nausea / vomiting. Start on day 3 after chemotherapy. (Patient not taking: Reported on 01/14/2023) 30 tablet 1   predniSONE (STERAPRED UNI-PAK 48 TAB) 10 MG (48) TBPK tablet Please instruct to 12-day Dosepak 48 tablet 0   zinc gluconate 50 MG tablet Take 50 mg by mouth daily. (Patient not taking: Reported on 09/24/2022)     No current facility-administered medications for this visit.    PHYSICAL EXAMINATION: ECOG PERFORMANCE STATUS: 0 - Asymptomatic  Vitals:   02/03/23 1248  BP: (!) 138/91  Pulse: 93  Resp: 18  Temp: (!) 97.5 F (36.4 C)  SpO2: 100%   Wt Readings from Last 3 Encounters:  02/03/23 265 lb 5 oz (120.3 kg)  01/14/23 258 lb 9.6 oz (117.3 kg)  10/24/22 250 lb (113.4 kg)     GENERAL:alert, no distress and comfortable SKIN: skin color normal, no rashes or significant lesions EYES: normal, Conjunctiva are pink and non-injected, sclera clear  NEURO: alert & oriented x 3 with fluent speech NECK: (-) supple, thyroid normal size, non-tender, without nodularity LYMPH: (-) no palpable lymphadenopathy in the cervical, axillary  LUNGS: (-) clear to auscultation and percussion with normal breathing effort HEART: (-) regular rate & rhythm and no murmurs and no lower extremity  edema ABDOMEN:(-) abdomen soft, (-) non-tender and (-) normal bowel sounds  LABORATORY DATA:  I have reviewed the data as listed    Latest Ref Rng & Units 02/03/2023   12:31 PM 10/03/2022    9:12 AM 06/28/2022    9:11 AM  CBC  WBC 4.0 - 10.5 K/uL 8.4  7.7  7.3   Hemoglobin 13.0 - 17.0 g/dL 01.0  27.2  53.6   Hematocrit 39.0 - 52.0 % 42.7  42.1  41.8   Platelets 150 - 400 K/uL 267  245  238         Latest Ref Rng & Units 02/03/2023   12:31 PM 10/03/2022    9:12 AM 06/28/2022    9:11 AM  CMP  Glucose 70 - 99 mg/dL 644  86  96   BUN 6 - 20 mg/dL 13  20  17    Creatinine 0.61 - 1.24 mg/dL 0.34  7.42  5.95   Sodium 135 - 145 mmol/L 136  139  139   Potassium 3.5 -  5.1 mmol/L 3.8  4.2  4.3   Chloride 98 - 111 mmol/L 105  105  106   CO2 22 - 32 mmol/L 26  30  28    Calcium 8.9 - 10.3 mg/dL 9.3  9.3  9.5   Total Protein 6.5 - 8.1 g/dL 7.9  8.1  7.6   Total Bilirubin 0.3 - 1.2 mg/dL 1.2  0.9  1.4   Alkaline Phos 38 - 126 U/L 55  62  60   AST 15 - 41 U/L 27  26  27    ALT 0 - 44 U/L 37  35  39       RADIOGRAPHIC STUDIES: I have personally reviewed the radiological images as listed and agreed with the findings in the report. No results found.    No orders of the defined types were placed in this encounter.  All questions were answered. The patient knows to call the clinic with any problems, questions or concerns. No barriers to learning was detected. The total time spent in the appointment was 20 minutes.     Malachy Mood, MD 02/03/2023   Carolin Coy, CMA, am acting as scribe for Malachy Mood, MD.   I have reviewed the above documentation for accuracy and completeness, and I agree with the above.

## 2023-02-02 NOTE — Assessment & Plan Note (Signed)
 G1, stage IIIB p(T3, N1a) cM0, stage IIIB, MSS -initially developed abdominal pain/cramping in 02/2021 and rectal bleeding since 05/2021. Colonoscopy 09/11/21 showed 6 cm sigmoid colon mass located 20-26 cm from anal verge. Biopsy confirmed adenocarcinoma. -staging CT 4/25 and MRI 4/27 showed suspicious mesenteric lymph nodes, no distant metastasis. -s/p surgical resection 10/17/21 with Dr. [name], path showed clear margins and one positive node -baseline CEA 11/13/21 was WNL. -given his stage IIIB disease, he completed 4 cycles adjuvant CAPEOX 11/19/21 - 02/03/22. He tolerated relatively well overall. -post-treatment CT CAP 03/22/22 showed NED. -he is clinically doing very well with minimal remaining side effects.  -his surveillance CT on 10/03/2022 showed surgical change, no evidence of cancer recurrence.  Plan to repeat in next CT in November 2024.

## 2023-02-03 ENCOUNTER — Inpatient Hospital Stay: Payer: 59 | Attending: Genetic Counselor

## 2023-02-03 ENCOUNTER — Encounter: Payer: Self-pay | Admitting: Hematology

## 2023-02-03 ENCOUNTER — Inpatient Hospital Stay (HOSPITAL_BASED_OUTPATIENT_CLINIC_OR_DEPARTMENT_OTHER): Payer: 59 | Admitting: Hematology

## 2023-02-03 ENCOUNTER — Telehealth: Payer: Self-pay | Admitting: Hematology

## 2023-02-03 VITALS — BP 138/91 | HR 93 | Temp 97.5°F | Resp 18 | Wt 265.3 lb

## 2023-02-03 DIAGNOSIS — R5383 Other fatigue: Secondary | ICD-10-CM | POA: Insufficient documentation

## 2023-02-03 DIAGNOSIS — Z08 Encounter for follow-up examination after completed treatment for malignant neoplasm: Secondary | ICD-10-CM | POA: Insufficient documentation

## 2023-02-03 DIAGNOSIS — Z85038 Personal history of other malignant neoplasm of large intestine: Secondary | ICD-10-CM | POA: Diagnosis present

## 2023-02-03 DIAGNOSIS — Z8052 Family history of malignant neoplasm of bladder: Secondary | ICD-10-CM | POA: Diagnosis not present

## 2023-02-03 DIAGNOSIS — Z9221 Personal history of antineoplastic chemotherapy: Secondary | ICD-10-CM | POA: Insufficient documentation

## 2023-02-03 DIAGNOSIS — Z800 Family history of malignant neoplasm of digestive organs: Secondary | ICD-10-CM | POA: Diagnosis not present

## 2023-02-03 DIAGNOSIS — C187 Malignant neoplasm of sigmoid colon: Secondary | ICD-10-CM

## 2023-02-03 LAB — CMP (CANCER CENTER ONLY)
ALT: 37 U/L (ref 0–44)
AST: 27 U/L (ref 15–41)
Albumin: 4.4 g/dL (ref 3.5–5.0)
Alkaline Phosphatase: 55 U/L (ref 38–126)
Anion gap: 5 (ref 5–15)
BUN: 13 mg/dL (ref 6–20)
CO2: 26 mmol/L (ref 22–32)
Calcium: 9.3 mg/dL (ref 8.9–10.3)
Chloride: 105 mmol/L (ref 98–111)
Creatinine: 1.13 mg/dL (ref 0.61–1.24)
GFR, Estimated: >60 mL/min (ref >60)
Glucose, Bld: 136 mg/dL — ABNORMAL HIGH (ref 70–99)
Potassium: 3.8 mmol/L (ref 3.5–5.1)
Sodium: 136 mmol/L (ref 135–145)
Total Bilirubin: 1.2 mg/dL (ref 0.3–1.2)
Total Protein: 7.9 g/dL (ref 6.5–8.1)

## 2023-02-03 LAB — CEA (ACCESS): CEA (CHCC): 2.21 ng/mL (ref 0.00–5.00)

## 2023-02-03 LAB — CBC WITH DIFFERENTIAL (CANCER CENTER ONLY)
Abs Immature Granulocytes: 0.02 10*3/uL (ref 0.00–0.07)
Basophils Absolute: 0.1 10*3/uL (ref 0.0–0.1)
Basophils Relative: 1 %
Eosinophils Absolute: 0.4 10*3/uL (ref 0.0–0.5)
Eosinophils Relative: 5 %
HCT: 42.7 % (ref 39.0–52.0)
Hemoglobin: 14.6 g/dL (ref 13.0–17.0)
Immature Granulocytes: 0 %
Lymphocytes Relative: 28 %
Lymphs Abs: 2.3 10*3/uL (ref 0.7–4.0)
MCH: 30.5 pg (ref 26.0–34.0)
MCHC: 34.2 g/dL (ref 30.0–36.0)
MCV: 89.3 fL (ref 80.0–100.0)
Monocytes Absolute: 0.8 10*3/uL (ref 0.1–1.0)
Monocytes Relative: 9 %
Neutro Abs: 4.8 10*3/uL (ref 1.7–7.7)
Neutrophils Relative %: 57 %
Platelet Count: 267 10*3/uL (ref 150–400)
RBC: 4.78 MIL/uL (ref 4.22–5.81)
RDW: 12.6 % (ref 11.5–15.5)
WBC Count: 8.4 10*3/uL (ref 4.0–10.5)
nRBC: 0 % (ref 0.0–0.2)

## 2023-02-03 LAB — IRON AND IRON BINDING CAPACITY (CC-WL,HP ONLY)
Iron: 99 ug/dL (ref 45–182)
Saturation Ratios: 27 % (ref 17.9–39.5)
TIBC: 363 ug/dL (ref 250–450)
UIBC: 264 ug/dL (ref 117–376)

## 2023-02-03 LAB — FERRITIN: Ferritin: 56 ng/mL (ref 24–336)

## 2023-02-05 ENCOUNTER — Other Ambulatory Visit: Payer: Self-pay

## 2023-02-11 ENCOUNTER — Other Ambulatory Visit: Payer: Self-pay

## 2023-04-28 ENCOUNTER — Encounter: Payer: Self-pay | Admitting: Hematology

## 2023-05-05 ENCOUNTER — Ambulatory Visit: Payer: BC Managed Care – PPO | Admitting: Neurology

## 2023-05-05 ENCOUNTER — Encounter: Payer: Self-pay | Admitting: Neurology

## 2023-05-05 ENCOUNTER — Encounter: Payer: Self-pay | Admitting: Hematology

## 2023-05-05 VITALS — BP 136/78 | HR 82 | Ht 74.0 in | Wt 262.0 lb

## 2023-05-05 DIAGNOSIS — C189 Malignant neoplasm of colon, unspecified: Secondary | ICD-10-CM

## 2023-05-05 DIAGNOSIS — R51 Headache with orthostatic component, not elsewhere classified: Secondary | ICD-10-CM

## 2023-05-05 DIAGNOSIS — G44019 Episodic cluster headache, not intractable: Secondary | ICD-10-CM

## 2023-05-05 DIAGNOSIS — R7309 Other abnormal glucose: Secondary | ICD-10-CM | POA: Diagnosis not present

## 2023-05-05 DIAGNOSIS — R519 Headache, unspecified: Secondary | ICD-10-CM | POA: Diagnosis not present

## 2023-05-05 DIAGNOSIS — H539 Unspecified visual disturbance: Secondary | ICD-10-CM | POA: Diagnosis not present

## 2023-05-05 DIAGNOSIS — C7931 Secondary malignant neoplasm of brain: Secondary | ICD-10-CM

## 2023-05-05 DIAGNOSIS — E538 Deficiency of other specified B group vitamins: Secondary | ICD-10-CM | POA: Diagnosis not present

## 2023-05-05 DIAGNOSIS — E669 Obesity, unspecified: Secondary | ICD-10-CM

## 2023-05-05 DIAGNOSIS — R5383 Other fatigue: Secondary | ICD-10-CM | POA: Diagnosis not present

## 2023-05-05 DIAGNOSIS — R0683 Snoring: Secondary | ICD-10-CM

## 2023-05-05 DIAGNOSIS — G441 Vascular headache, not elsewhere classified: Secondary | ICD-10-CM

## 2023-05-05 DIAGNOSIS — E519 Thiamine deficiency, unspecified: Secondary | ICD-10-CM

## 2023-05-05 DIAGNOSIS — G4453 Primary thunderclap headache: Secondary | ICD-10-CM

## 2023-05-05 DIAGNOSIS — G4719 Other hypersomnia: Secondary | ICD-10-CM

## 2023-05-05 MED ORDER — AMITRIPTYLINE HCL 10 MG PO TABS: 10.0000 mg | ORAL_TABLET | Freq: Every day | ORAL | 3 refills | Status: AC

## 2023-05-05 NOTE — Progress Notes (Signed)
[facility]    Provider:  Dr Lucia Gaskins Requesting Provider: Mliss Sax,* Primary Care Provider:  Mliss Sax, MD  CC:  headaches  HPI:  Philip Clark is a 37 y.o. male here as requested by Mliss Sax,* for Headaches. has Knee LCL sprain; Injury of left knee; S/P ACL reconstruction; Bronchitis; Anxiety; Allergy; Rib cage dysfunction; Cauda equina injury without bone injury (HCC); Rib pain; Lumbar radiculopathy; Elevated pulse rate; Viral syndrome; Heme positive stool; Hematochezia; Lower abdominal pain; Anemia; IDA (iron deficiency anemia); Adenocarcinoma of sigmoid colon (HCC); S/P laparoscopic-assisted sigmoidectomy; Genetic testing; and Nonintractable headache on their problem list. He has a lrage neck, severe snoring, wife thinks he has sleep apnea, wakes up with headaches, gained weight gain obese 933) gained about 10 pounds. Toss oand turn all night long. Malampati 4. Father had sleep apnea. 18.5 neck. Headaches are in the morning, pressure, no light or sound sensitivity. He has positional headaches, worse supine. Vison changes worsning headaches. Hx of colon cancer need to rulw out mests. . Headaches started in March, worsening, now chronic daily with concerning symptoms. No numbness, tingling, weakness, imbalance, throuble swallowing, not exertional, but headache are severe, inytractable, positional, vision chanes,Pressure, around the head,can be thundeclap severe. No pulsations. Can be stabbing around the eye without autonomic symptoms. Has low back pain and neck pain arhtritic changes s/p chemo. No other focal neurologic deficits, associated symptoms, inciting events or modifiable factors.    Reviewed notes, labs and imaging from outside physicians, which showed :  MRi lumbar spine:  CLINICAL DATA:  Initial evaluation for low back pain for 2-3 months with radiation into the legs and knees, cauda equina syndrome suspected.    EXAM: MRI LUMBAR SPINE WITHOUT CONTRAST   TECHNIQUE: Multiplanar, multisequence MR imaging of the lumbar spine was performed. No intravenous contrast was administered.   COMPARISON:  Prior radiograph from 10/19/2020.   FINDINGS: Segmentation: Standard. Lowest well-formed disc space labeled the L5-S1 level.   Alignment: Trace levoscoliosis with straightening of the normal lumbar lordosis. No significant listhesis.   Vertebrae: Vertebral body height maintained without acute or chronic fracture. Bone marrow signal intensity within normal limits. No worrisome osseous lesions. Discogenic reactive endplate change present about the L4-5 and L5-S1 interspaces. No other abnormal marrow edema.   Conus medullaris and cauda equina: Conus extends to the T12-L1 level. Conus and cauda equina appear normal.   Paraspinal and other soft tissues: Paraspinous soft tissues within normal limits. Benign appearing T2 hyperintense cyst partially visualized at the left kidney. Visualized visceral structures otherwise unremarkable.   Disc levels:   T12-L1: Unremarkable.   L1-2:  Unremarkable.   L2-3: Subtle left extraforaminal disc protrusion closely approximates the exiting left L2 nerve root without impingement or displacement (series 5, image 11). No spinal stenosis. Foramina remain patent.   L3-4: Small left foraminal to extraforaminal disc protrusion closely approximates the exiting left L3 nerve root without frank impingement or displacement (series 5, image 16). No spinal stenosis. Foramina remain patent.   L4-5: Disc desiccation with mild disc bulge. Mild reactive endplate change. Annular fissure at the level of the right lateral recess. No significant spinal stenosis. Mild bilateral L4 foraminal stenosis.   L5-S1: Degenerative intervertebral disc space narrowing with disc desiccation. Reactive endplate spurring. Left subarticular disc protrusion encroaches upon the left lateral  recess, mildly displacing the descending left S1 nerve root (series 5, image 30). Mild left lateral recess stenosis. Central canal remains widely patent. Mild left L5 foraminal stenosis. Right  neural foramen remains patent.   IMPRESSION: 1. Small left subarticular disc protrusion at L5-S1, mildly displacing the descending left S1 nerve root in the left lateral recess. 2. Small left foraminal to extraforaminal disc protrusions at L2-3 and L3-4, closely approximating and potentially irritating the exiting left L2 and L3 nerve roots respectively. 3. Mild disc bulge with reactive endplate change at L4-5 with resultant mild bilateral L4 foraminal stenosis.       Latest Ref Rng & Units 02/03/2023   12:31 PM 10/03/2022    9:12 AM 06/28/2022    9:11 AM  CBC  WBC 4.0 - 10.5 K/uL 8.4  7.7  7.3   Hemoglobin 13.0 - 17.0 g/dL 16.1  09.6  04.5   Hematocrit 39.0 - 52.0 % 42.7  42.1  41.8   Platelets 150 - 400 K/uL 267  245  238       Latest Ref Rng & Units 02/03/2023   12:31 PM 10/03/2022    9:12 AM 06/28/2022    9:11 AM  CMP  Glucose 70 - 99 mg/dL 409  86  96   BUN 6 - 20 mg/dL 13  20  17    Creatinine 0.61 - 1.24 mg/dL 8.11  9.14  7.82   Sodium 135 - 145 mmol/L 136  139  139   Potassium 3.5 - 5.1 mmol/L 3.8  4.2  4.3   Chloride 98 - 111 mmol/L 105  105  106   CO2 22 - 32 mmol/L 26  30  28    Calcium 8.9 - 10.3 mg/dL 9.3  9.3  9.5   Total Protein 6.5 - 8.1 g/dL 7.9  8.1  7.6   Total Bilirubin 0.3 - 1.2 mg/dL 1.2  0.9  1.4   Alkaline Phos 38 - 126 U/L 55  62  60   AST 15 - 41 U/L 27  26  27    ALT 0 - 44 U/L 37  35  39      Review of Systems: Patient complains of symptoms per HPI as well as the following symptoms none. Pertinent negatives and positives per HPI. All others negative.   Social History   Socioeconomic History   Marital status: Married    Spouse name: Not on file   Number of children: 2   Years of education: Not on file   Highest education level: Not on file  Occupational  History   Occupation: Theatre stage manager  Tobacco Use   Smoking status: Never   Smokeless tobacco: Never  Vaping Use   Vaping status: Never Used  Substance and Sexual Activity   Alcohol use: Yes    Alcohol/week: 1.0 standard drink of alcohol    Types: 1 Cans of beer per week    Comment: occ   Drug use: No   Sexual activity: Yes  Other Topics Concern   Not on file  Social History Narrative   Not on file   Social Determinants of Health   Financial Resource Strain: Not on file  Food Insecurity: No Food Insecurity (06/28/2022)   Hunger Vital Sign    Worried About Running Out of Food in the Last Year: Never true    Ran Out of Food in the Last Year: Never true  Transportation Needs: No Transportation Needs (06/28/2022)   PRAPARE - Administrator, Civil Service (Medical): No    Lack of Transportation (Non-Medical): No  Physical Activity: Not on file  Stress: Not on file  Social Connections: Not on  file  Intimate Partner Violence: Not on file    Family History  Problem Relation Age of Onset   Anxiety disorder Mother    Heart disease Father 28       MI x 2   Skin cancer Father        basal or squamous cell   Dementia Maternal Grandmother    Heart disease Maternal Grandfather 1       CAD with CABG   Bladder Cancer Maternal Grandfather        dx. 60s   Colon cancer Paternal Grandmother        dx. 60s/70s   Early death Paternal Grandfather 57       CAD/MI   Rectal cancer Neg Hx    Stomach cancer Neg Hx    Esophageal cancer Neg Hx    Colon polyps Neg Hx     Past Medical History:  Diagnosis Date   Allergy    Anemia    Anxiety    Bronchitis    Cancer (HCC)    Depression     Patient Active Problem List   Diagnosis Date Noted   Nonintractable headache 01/14/2023   Genetic testing 10/29/2021   S/P laparoscopic-assisted sigmoidectomy 10/17/2021   Adenocarcinoma of sigmoid colon (HCC) 10/02/2021   IDA (iron deficiency anemia) 08/13/2021   Heme positive stool  07/02/2021   Hematochezia 07/02/2021   Lower abdominal pain 07/02/2021   Anemia 07/02/2021   Elevated pulse rate 04/17/2021   Viral syndrome 04/17/2021   Rib pain 10/19/2020   Lumbar radiculopathy 10/19/2020   Rib cage dysfunction 10/18/2020   Cauda equina injury without bone injury (HCC) 10/18/2020   Bronchitis    Anxiety    Allergy    S/P ACL reconstruction 07/16/2016   Injury of left knee 05/28/2016   Knee LCL sprain 11/24/2014    Past Surgical History:  Procedure Laterality Date   ANTERIOR CRUCIATE LIGAMENT (ACL) REVISION Left    FLEXIBLE SIGMOIDOSCOPY N/A 10/17/2021   Procedure: FLEXIBLE SIGMOIDOSCOPY, INTRAOPERATIVE ASSESSMENT OF PERFUSION;  Surgeon: Andria Meuse, MD;  Location: [facility];  Service: General;  Laterality: N/A;   XI ROBOTIC ASSISTED LOWER ANTERIOR RESECTION N/A 10/17/2021   Procedure: XI ROBOTIC ASSISTED LOWER ANTERIOR RESECTION;  Surgeon: Andria Meuse, MD;  Location: [facility];  Service: General;  Laterality: N/A;  GEN AND TAP BLOCK    Current Outpatient Medications  Medication Sig Dispense Refill   amitriptyline (ELAVIL) 10 MG tablet Take 1-3 tablets (10-30 mg total) by mouth at bedtime. 30 tablet 3   ibuprofen (ADVIL) 800 MG tablet Take 800 mg by mouth every 8 (eight) hours as needed.     Acetaminophen (TYLENOL PO) Take 1,250 mg by mouth every 8 (eight) hours as needed. (Patient not taking: Reported on 05/05/2023)     butalbital-acetaminophen-caffeine (FIORICET) 50-325-40 MG tablet Take 1 tablet by mouth every 6 (six) hours as needed for headache. (Patient not taking: Reported on 01/14/2023) 14 tablet 0   hydrOXYzine (VISTARIL) 25 MG capsule Take 1 capsule (25 mg total) by mouth every 8 (eight) hours as needed. (Patient not taking: Reported on 05/05/2023) 30 capsule 0   ondansetron (ZOFRAN) 8 MG tablet Take 1 tablet (8 mg total) by mouth 2 (two) times daily as needed for refractory nausea / vomiting. Start on day 3 after chemotherapy. (Patient not  taking: Reported on 01/14/2023) 30 tablet 1   predniSONE (STERAPRED UNI-PAK 48 TAB) 10 MG (48) TBPK tablet Please instruct to 12-day Dosepak (Patient  not taking: Reported on 05/05/2023) 48 tablet 0   No current facility-administered medications for this visit.    Allergies as of 05/05/2023   (Not on File)    Vitals: BP 136/78   Pulse 82   Ht 6\' 2"  (1.88 m)   Wt 262 lb (118.8 kg)   BMI 33.64 kg/m  Last Weight:  Wt Readings from Last 1 Encounters:  05/05/23 262 lb (118.8 kg)   Last Height:   Ht Readings from Last 1 Encounters:  05/05/23 6\' 2"  (1.88 m)     Physical exam: Exam: Gen: NAD, conversant, well nourised, obese, well groomed                     CV: RRR, no MRG. No Carotid Bruits. No peripheral edema, warm, nontender Eyes: Conjunctivae clear without exudates or hemorrhage  Neuro: Detailed Neurologic Exam  Speech:    Speech is normal; fluent and spontaneous with normal comprehension.  Cognition:    The patient is oriented to person, place, and time;     recent and remote memory intact;     language fluent;     normal attention, concentration,     fund of knowledge Cranial Nerves:    The pupils are equal, round, and reactive to light. The fundi are normal and spontaneous venous pulsations are present. Visual fields are full to finger confrontation. Extraocular movements are intact. Trigeminal sensation is intact and the muscles of mastication are normal. The face is symmetric. The palate elevates in the midline. Hearing intact. Voice is normal. Shoulder shrug is normal. The tongue has normal motion without fasciculations.   Coordination:    Normal finger to nose and heel to shin. Normal rapid alternating movements.   Gait:    Heel-toe and tandem gait are normal.   Motor Observation:    No asymmetry, no atrophy, and no involuntary movements noted. Tone:    Normal muscle tone.    Posture:    Posture is normal. normal erect    Strength:    Strength is V/V in  the upper and lower limbs.      Sensation: intact to LT     Reflex Exam:  DTR's:    Deep tendon reflexes in the upper and lower extremities are normal bilaterally.   Toes:    The toes are downgoing bilaterally.   Clonus:    Clonus is absent.    Assessment/Plan:  Patient with headaches and recent colon cancer  MRI brain due to concerning symptoms of morning headaches, positional headaches(worse supne and bending over),vision changes, worsening headaches  to look for space occupying mass(metastasis has colonc cancer), chiari or intracranial hypertension (pseudotumor), strokes, malignancies, vasculidities, demyelination(multiple sclerosis) or other  37 y.o. male here as requested by Mliss Sax,* for Headaches. has Knee LCL sprain; Injury of left knee; S/P ACL reconstruction; Bronchitis; Anxiety; Allergy; Rib cage dysfunction; Cauda equina injury without bone injury (HCC); Rib pain; Lumbar radiculopathy; Elevated pulse rate; Viral syndrome; Heme positive stool; Hematochezia; Lower abdominal pain; Anemia; IDA (iron deficiency anemia); Adenocarcinoma of sigmoid colon (HCC); S/P laparoscopic-assisted sigmoidectomy; Genetic testing; and Nonintractable headache on their problem list. He has a lrage neck, severe snoring, wife thinks he has sleep apnea, wakes up with headaches, gained weight gain obese 933) gained about 10 pounds. Toss oand turn all night long. Malampati 4. Father had sleep apnea. 18.5 neck. Headaches are in the morning, pressure, no light or sound sensitivity. He has positional headaches, worse supine. Vison  changes worsning headaches. Hx of colon cancer need to rulw out mests. . Headaches started in March, worsening, now chronic daily with concerning symptoms. No numbness, tingling, weakness, imbalance, throuble swallowing, not exertional, but headache are severe, inytractable, positional, vision chanes,Pressure, around the head,can be thundeclap severe. No pulsations. Can be  stabbing around the eye without autonomic symptoms. Has low back pain and neck pain arhtritic changes s/p chemo.   MRI brain due to concerning symptoms of morning headaches, positional and exertional headaches,vision changes, worsening headaches  to look for space occupying mass, chiari or intracranial hypertension (pseudotumor), strokes, malignancies, vasculidities, demyelination(multiple sclerosis) or other such as metastasis Bloodwork Sleep apnea referral Amitriptyline at bedtime for insomnia    Orders Placed This Encounter  Procedures   MR BRAIN W WO CONTRAST   Thyroid Panel With TSH   Comprehensive metabolic panel   CBC with Differential/Platelets   Testosterone   B12 and Folate Panel   Methylmalonic acid, serum   Vitamin D, 25-hydroxy   Vitamin B1   Hemoglobin A1c   Ambulatory referral to Sleep Studies   Meds ordered this encounter  Medications   amitriptyline (ELAVIL) 10 MG tablet    Sig: Take 1-3 tablets (10-30 mg total) by mouth at bedtime.    Dispense:  30 tablet    Refill:  3    Cc: Charmayne Sheer, MD  Naomie Dean, MD  [facility] [address] [place], [place] [address]  Phone [phone] Fax [phone]

## 2023-05-05 NOTE — Patient Instructions (Signed)
MRI brain due to concerning symptoms of morning headaches, positional and exertional headaches,vision changes, worsening headaches  to look for space occupying mass, chiari or intracranial hypertension (pseudotumor), strokes, malignancies, vasculidities, demyelination(multiple sclerosis) or other such as metastasis Bloodwork Sleep apnea referral Amitriptyline at bedtime for insomnia  Meds ordered this encounter  Medications   amitriptyline (ELAVIL) 10 MG tablet    Sig: Take 1-3 tablets (10-30 mg total) by mouth at bedtime.    Dispense:  30 tablet    Refill:  3   Orders Placed This Encounter  Procedures   MR BRAIN W WO CONTRAST   Thyroid Panel With TSH   Comprehensive metabolic panel   CBC with Differential/Platelets   Testosterone   B12 and Folate Panel   Methylmalonic acid, serum   Vitamin D, 25-hydroxy   Vitamin B1   Hemoglobin A1c   Ambulatory referral to Sleep Studies     Amitriptyline Tablets What is this medication? AMITRIPTYLINE (a mee TRIP ti leen) treats depression. It increases the amount of serotonin and norepinephrine in the brain, hormones that help regulate mood. It belongs to a group of medications called tricyclic antidepressants (TCAs). This medicine may be used for other purposes; ask your health care provider or pharmacist if you have questions. COMMON BRAND NAME(S): Elavil, Vanatrip What should I tell my care team before I take this medication? They need to know if you have any of these conditions: Asthma, trouble breathing Bipolar disorder or schizophrenia Difficulty passing urine, prostate trouble Frequently drink alcohol Glaucoma Heart disease or previous heart attack Liver disease Seizures Suicidal thoughts, plans, or attempt by you or a family member Thyroid disease An unusual or allergic reaction to amitriptyline, other medications, foods, dyes, or preservatives Pregnant or trying to get pregnant Breastfeeding How should I use this  medication? Take this medication by mouth with a drink of water. Follow the directions on the prescription label. You can take the tablets with or without food. Take your medication at regular intervals. Do not take it more often than directed. Do not stop taking this medication suddenly except upon the advice of your care team. Stopping this medication too quickly may cause serious side effects or your condition may worsen. A special MedGuide will be given to you by the pharmacist with each prescription and refill. Be sure to read this information carefully each time. Talk to your care team regarding the use of this medication in children. Special care may be needed. Overdosage: If you think you have taken too much of this medicine contact a poison control center or emergency room at once. NOTE: This medicine is only for you. Do not share this medicine with others. What if I miss a dose? If you miss a dose, take it as soon as you can. If it is almost time for your next dose, take only that dose. Do not take double or extra doses. What may interact with this medication? Do not take this medication with any of the following: Arsenic trioxide Certain medications used to regulate abnormal heartbeat or to treat other heart conditions Cisapride Droperidol Halofantrine Linezolid MAOIs like Carbex, Eldepryl, Marplan, Nardil, and Parnate Methylene blue Other medications for mental depression Phenothiazines like perphenazine, thioridazine and chlorpromazine Pimozide Probucol Procarbazine Sparfloxacin St. John's Wort This medication may also interact with the following: Atropine and related medications like hyoscyamine, scopolamine, tolterodine and others Barbiturate medications for inducing sleep or treating seizures, like phenobarbital Cimetidine Disulfiram Ethchlorvynol Thyroid hormones such as levothyroxine Ziprasidone This list may  not describe all possible interactions. Give your health  care provider a list of all the medicines, herbs, non-prescription drugs, or dietary supplements you use. Also tell them if you smoke, drink alcohol, or use illegal drugs. Some items may interact with your medicine. What should I watch for while using this medication? Visit your care team for regular checks on your progress. It may take several weeks to see the full effects of this medication, and it is important to continue your treatment as prescribed by your care team. Tell your care team if your symptoms do not get better or if they get worse. Patients and their families should watch out for new or worsening thoughts of suicide or depression. Also watch out for sudden changes in feelings such as feeling anxious, agitated, panicky, irritable, hostile, aggressive, impulsive, severely restless, overly excited and hyperactive, or not being able to sleep. If this happens, especially at the beginning of treatment or after a change in dose, call your care team. This medication may affect your coordination, reaction time, or judgment. Do not drive or operate machinery until you know how this medication affects you. Sit up or stand slowly to reduce the risk of dizzy or fainting spells. Drinking alcohol with this medication can increase the risk of these side effects. Do not treat yourself for coughs, colds, or allergies while you are taking this medication without asking your care team for advice. Some ingredients can increase possible side effects. Your mouth may get dry. Chewing sugarless gum or sucking hard candy and drinking plenty of water may help. Contact your care team if the problem does not go away or is severe. This medication may cause dry eyes and blurred vision. If you wear contact lenses, you may feel some discomfort. Lubricating eye drops may help. See your care team if the problem does not go away or is severe. This medication can cause constipation. If you do not have a bowel movement for 3  days, call your care team. This medication can make you more sensitive to the sun. Keep out of the sun. If you cannot avoid being in the sun, wear protective clothing and sunscreen. Do not use sun lamps, tanning beds, or tanning booths. What side effects may I notice from receiving this medication? Side effects that you should report to your care team as soon as possible: Allergic reactions--skin rash, itching, hives, swelling of the face, lips, tongue, or throat Heart rhythm changes-- fast or irregular heartbeat, dizziness, feeling faint or lightheaded, chest pain, trouble breathing Serotonin syndrome--irritability, confusion, fast or irregular heartbeat, muscle stiffness, twitching muscles, sweating, high fever, seizure, chills, vomiting, diarrhea Sudden eye pain or change in vision such as blurry vision, seeing halos around lights, vision loss Thoughts of suicide or self-harm, worsening mood, feelings of depression Side effects that usually do not require medical attention (report to your care team if they continue or are bothersome): Change in appetite or weight Change in sex drive or performance Constipation Dizziness Drowsiness Dry mouth Tremors This list may not describe all possible side effects. Call your doctor for medical advice about side effects. You may report side effects to FDA at 1-800-FDA-1088. Where should I keep my medication? Keep out of the reach of children and pets. Store at room temperature between 20 and 25 degrees C (68 and 77 degrees F). Throw away any unused medication after the expiration date. NOTE: This sheet is a summary. It may not cover all possible information. If you have questions about  this medicine, talk to your doctor, pharmacist, or health care provider.  2024 Elsevier/Gold Standard (2022-01-24 00:00:00)

## 2023-05-06 ENCOUNTER — Encounter: Payer: Self-pay | Admitting: Hematology

## 2023-05-07 ENCOUNTER — Other Ambulatory Visit: Payer: BC Managed Care – PPO

## 2023-05-07 ENCOUNTER — Other Ambulatory Visit: Payer: Self-pay | Admitting: Neurology

## 2023-05-07 ENCOUNTER — Encounter: Payer: Self-pay | Admitting: Hematology

## 2023-05-07 ENCOUNTER — Telehealth: Payer: Self-pay | Admitting: Family Medicine

## 2023-05-07 ENCOUNTER — Telehealth: Payer: Self-pay | Admitting: *Deleted

## 2023-05-07 DIAGNOSIS — C189 Malignant neoplasm of colon, unspecified: Secondary | ICD-10-CM

## 2023-05-07 DIAGNOSIS — E519 Thiamine deficiency, unspecified: Secondary | ICD-10-CM

## 2023-05-07 DIAGNOSIS — G441 Vascular headache, not elsewhere classified: Secondary | ICD-10-CM

## 2023-05-07 DIAGNOSIS — H539 Unspecified visual disturbance: Secondary | ICD-10-CM

## 2023-05-07 DIAGNOSIS — R51 Headache with orthostatic component, not elsewhere classified: Secondary | ICD-10-CM | POA: Diagnosis not present

## 2023-05-07 DIAGNOSIS — E538 Deficiency of other specified B group vitamins: Secondary | ICD-10-CM

## 2023-05-07 DIAGNOSIS — G44019 Episodic cluster headache, not intractable: Secondary | ICD-10-CM

## 2023-05-07 DIAGNOSIS — E669 Obesity, unspecified: Secondary | ICD-10-CM

## 2023-05-07 DIAGNOSIS — T508X5A Adverse effect of diagnostic agents, initial encounter: Secondary | ICD-10-CM | POA: Diagnosis not present

## 2023-05-07 DIAGNOSIS — C7931 Secondary malignant neoplasm of brain: Secondary | ICD-10-CM

## 2023-05-07 DIAGNOSIS — G4453 Primary thunderclap headache: Secondary | ICD-10-CM

## 2023-05-07 DIAGNOSIS — R519 Headache, unspecified: Secondary | ICD-10-CM

## 2023-05-07 DIAGNOSIS — R7309 Other abnormal glucose: Secondary | ICD-10-CM

## 2023-05-07 DIAGNOSIS — G4719 Other hypersomnia: Secondary | ICD-10-CM

## 2023-05-07 DIAGNOSIS — R0683 Snoring: Secondary | ICD-10-CM

## 2023-05-07 DIAGNOSIS — R5383 Other fatigue: Secondary | ICD-10-CM

## 2023-05-07 NOTE — Telephone Encounter (Addendum)
Patient here this morning for MRI. We were notified around 9:30 AM that patient was having a reaction to Multihance contrast. He received 3 mL.  He sneezed and developed a few scattered hives spots on his chest and abdomen. Dr Lucia Gaskins came and saw patient. He stated his throat felt a little itchy and he was clearing his throat. I was told by MRI that his IV had blown. BP 149/97 HR 90 sats 96-97% (L arm). Intrafusion provided assistance and administered infusion of push Benadryl 50 mg at 9:43 am followed by Solumedrol 120 mg through a new IV started in the L hand (1st attempt failed in R AC). Patient's condition remained stable and he reported he had no difficulty breathing. His lungs and tracheal breath sounds were CTA, no sign of wheezing or stridor. His VS remained stable, at 09:50 BP 124/86 HR 76 sats 98% (R arm). He remained in the infusion suite for observation. At 1015 I checked on pt. He stated he felt sleepy but his itchy throat had resolved and his hives spots were checked which have improved. Patient's parents to come pick him up at 10:30 AM. Per Dr Lucia Gaskins, cleared to leave office. Infusion documented orders for above medications and Dr Lucia Gaskins signed the order. VS at 10:25 BP 117/80 HR 76, sats 98%. Pt reported he felt better.  Patient had IV removed and was released at 10:30 AM by intrafusion.  Multihance has been added to pt's allergy list. MRI states they were able to get the full non-contrast MRI study completed.

## 2023-05-07 NOTE — Telephone Encounter (Signed)
Pt said he had a MRI today and pt had a allergic reaction to the contrast used. Pt broke out in hives , but pt throat is ok patient was given  Benadryl and prednisone . Please give him a call

## 2023-05-08 ENCOUNTER — Encounter: Payer: Self-pay | Admitting: Hematology

## 2023-05-08 ENCOUNTER — Other Ambulatory Visit: Payer: Self-pay

## 2023-05-08 LAB — COMPREHENSIVE METABOLIC PANEL
ALT: 36 [IU]/L (ref 0–44)
AST: 30 [IU]/L (ref 0–40)
Albumin: 4.5 g/dL (ref 4.1–5.1)
Alkaline Phosphatase: 71 [IU]/L (ref 44–121)
BUN/Creatinine Ratio: 14 (ref 9–20)
BUN: 16 mg/dL (ref 6–20)
Bilirubin Total: 1 mg/dL (ref 0.0–1.2)
CO2: 23 mmol/L (ref 20–29)
Calcium: 9.9 mg/dL (ref 8.7–10.2)
Chloride: 102 mmol/L (ref 96–106)
Creatinine, Ser: 1.18 mg/dL (ref 0.76–1.27)
Globulin, Total: 3.4 g/dL (ref 1.5–4.5)
Glucose: 106 mg/dL — ABNORMAL HIGH (ref 70–99)
Potassium: 4.5 mmol/L (ref 3.5–5.2)
Sodium: 139 mmol/L (ref 134–144)
Total Protein: 7.9 g/dL (ref 6.0–8.5)
eGFR: 82 mL/min/{1.73_m2} (ref >59)

## 2023-05-08 LAB — CBC WITH DIFFERENTIAL/PLATELET
Basophils Absolute: 0.1 10*3/uL (ref 0.0–0.2)
Basos: 1 %
EOS (ABSOLUTE): 0.4 10*3/uL (ref 0.0–0.4)
Eos: 5 %
Hematocrit: 43.2 % (ref 37.5–51.0)
Hemoglobin: 14.5 g/dL (ref 13.0–17.7)
Immature Grans (Abs): 0 10*3/uL (ref 0.0–0.1)
Immature Granulocytes: 0 %
Lymphocytes Absolute: 2.2 10*3/uL (ref 0.7–3.1)
Lymphs: 30 %
MCH: 30.6 pg (ref 26.6–33.0)
MCHC: 33.6 g/dL (ref 31.5–35.7)
MCV: 91 fL (ref 79–97)
Monocytes Absolute: 0.7 10*3/uL (ref 0.1–0.9)
Monocytes: 10 %
Neutrophils Absolute: 4.1 10*3/uL (ref 1.4–7.0)
Neutrophils: 54 %
Platelets: 278 10*3/uL (ref 150–450)
RBC: 4.74 x10E6/uL (ref 4.14–5.80)
RDW: 12.7 % (ref 11.6–15.4)
WBC: 7.5 10*3/uL (ref 3.4–10.8)

## 2023-05-08 LAB — THYROID PANEL WITH TSH
Free Thyroxine Index: 2.1 (ref 1.2–4.9)
T3 Uptake Ratio: 25 % (ref 24–39)
T4, Total: 8.2 ug/dL (ref 4.5–12.0)
TSH: 2.37 u[IU]/mL (ref 0.450–4.500)

## 2023-05-08 LAB — VITAMIN D 25 HYDROXY (VIT D DEFICIENCY, FRACTURES): Vit D, 25-Hydroxy: 24.8 ng/mL — ABNORMAL LOW (ref 30.0–100.0)

## 2023-05-08 LAB — B12 AND FOLATE PANEL
Folate: 10.2 ng/mL (ref >3.0)
Vitamin B-12: 422 pg/mL (ref 232–1245)

## 2023-05-08 LAB — VITAMIN B1: Thiamine: 121.3 nmol/L (ref 66.5–200.0)

## 2023-05-08 LAB — TESTOSTERONE: Testosterone: 302 ng/dL (ref 264–916)

## 2023-05-08 LAB — METHYLMALONIC ACID, SERUM: Methylmalonic Acid: 268 nmol/L (ref 0–378)

## 2023-05-08 LAB — HEMOGLOBIN A1C
Est. average glucose Bld gHb Est-mCnc: 114 mg/dL
Hgb A1c MFr Bld: 5.6 % (ref 4.8–5.6)

## 2023-05-13 DIAGNOSIS — R5382 Chronic fatigue, unspecified: Secondary | ICD-10-CM | POA: Diagnosis not present

## 2023-05-13 DIAGNOSIS — E291 Testicular hypofunction: Secondary | ICD-10-CM | POA: Diagnosis not present

## 2023-05-13 DIAGNOSIS — Z79890 Hormone replacement therapy (postmenopausal): Secondary | ICD-10-CM | POA: Diagnosis not present

## 2023-05-15 DIAGNOSIS — R519 Headache, unspecified: Secondary | ICD-10-CM | POA: Diagnosis not present

## 2023-05-15 DIAGNOSIS — G479 Sleep disorder, unspecified: Secondary | ICD-10-CM | POA: Diagnosis not present

## 2023-05-15 DIAGNOSIS — E291 Testicular hypofunction: Secondary | ICD-10-CM | POA: Diagnosis not present

## 2023-05-15 DIAGNOSIS — R5382 Chronic fatigue, unspecified: Secondary | ICD-10-CM | POA: Diagnosis not present

## 2023-05-26 ENCOUNTER — Encounter: Payer: Self-pay | Admitting: Hematology

## 2023-06-02 ENCOUNTER — Other Ambulatory Visit: Payer: 59

## 2023-06-02 ENCOUNTER — Ambulatory Visit: Payer: 59 | Admitting: Hematology

## 2023-06-05 ENCOUNTER — Ambulatory Visit (INDEPENDENT_AMBULATORY_CARE_PROVIDER_SITE_OTHER): Payer: BC Managed Care – PPO | Admitting: Neurology

## 2023-06-05 ENCOUNTER — Encounter: Payer: Self-pay | Admitting: Neurology

## 2023-06-05 VITALS — BP 131/87 | HR 85 | Ht 74.0 in | Wt 266.0 lb

## 2023-06-05 DIAGNOSIS — R519 Headache, unspecified: Secondary | ICD-10-CM | POA: Insufficient documentation

## 2023-06-05 DIAGNOSIS — G478 Other sleep disorders: Secondary | ICD-10-CM | POA: Diagnosis not present

## 2023-06-05 DIAGNOSIS — E6609 Other obesity due to excess calories: Secondary | ICD-10-CM

## 2023-06-05 DIAGNOSIS — E66811 Obesity, class 1: Secondary | ICD-10-CM | POA: Insufficient documentation

## 2023-06-05 DIAGNOSIS — R0683 Snoring: Secondary | ICD-10-CM | POA: Insufficient documentation

## 2023-06-05 DIAGNOSIS — G4726 Circadian rhythm sleep disorder, shift work type: Secondary | ICD-10-CM | POA: Insufficient documentation

## 2023-06-05 DIAGNOSIS — Z6834 Body mass index (BMI) 34.0-34.9, adult: Secondary | ICD-10-CM

## 2023-06-05 DIAGNOSIS — J301 Allergic rhinitis due to pollen: Secondary | ICD-10-CM | POA: Diagnosis not present

## 2023-06-05 NOTE — Progress Notes (Signed)
 [facility]    Provider:  Dedra Gores, MD  Primary Care Physician:  [name], MD [address] [place] [address]     Referring Provider:   Ines Onetha NOVAK, Md [address] [place],  [place] [address]          Chief Complaint according to patient   Patient presents with:     New Patient (Initial Visit)           HISTORY OF PRESENT ILLNESS:  Philip Clark is a 38 y.o. male patient who is seen upon referral on 06/05/2023 from Dr Ines, Chief concern according to patient :  I have not had success in treatment of daily headaches. I had never taken a prednisone  pack, but tried OTC NSAIDS.  Never took NSAIDS daily      I have the pleasure of seeing Philip Clark 06/05/23 a right-handed male with a sleep related headache possible sleep disorder.  I have dull headaches, worse with Valsalva,  mostly right frontal and behind the eyes. 30 second headaches of shooting, sharp sensation. A couple of times a week.  I wake up with headaches , not woken by headaches.  My left face feels different from my right. Right side vision is  less acute.    Brain MRI was negative. In April 2023 dx with colon cancer, has resection and chemo therapy, no radiation.   I was dx with low testosterone .      headaches   HPI:  Philip Clark is a 38 y.o. male here as requested by [name],* for Headaches. has Knee LCL sprain; Injury of left knee; S/P ACL reconstruction; Bronchitis; Anxiety; Allergy; Rib cage dysfunction; Cauda equina injury without bone injury (HCC); Rib pain; Lumbar radiculopathy; Elevated pulse rate; Viral syndrome; Heme positive stool; Hematochezia; Lower abdominal pain; Anemia; IDA (iron deficiency anemia); Adenocarcinoma of sigmoid colon (HCC); S/P laparoscopic-assisted sigmoidectomy; Genetic testing; and Nonintractable headache on their problem list. He has a lrage neck, severe snoring, wife thinks he has sleep apnea,  wakes up with headaches, gained weight gain obese 933) gained about 10 pounds. Toss oand turn all night long. Malampati 4. Father had sleep apnea. 18.5 neck. Headaches are in the morning, pressure, no light or sound sensitivity. He has positional headaches, worse supine. Vison changes worsning headaches. Hx of colon cancer need to rulw out mests. . Headaches started in March, worsening, now chronic daily with concerning symptoms. No numbness, tingling, weakness, imbalance, throuble swallowing, not exertional, but headache are severe, inytractable, positional, vision chanes,Pressure, around the head,can be thundeclap severe. No pulsations. Can be stabbing around the eye without autonomic symptoms. Has low back pain and neck pain arhtritic changes s/p chemo. No other focal neurologic deficits, associated symptoms, inciting events or modifiable factors. New glasses prescribed. No effect.     Sleep relevant medical history: Nocturia - 0-2 , ENT : no Tonsillectomy,  ear tubes as a child, no,cervical spine surgery, have DDD at the lumbar level.     Family medical /sleep history:  father with OSA, is snoring, wakes up 4-5 times a night.    Social history:  Patient is working as  it sales professional,  and lives in a household with spouse, 2 pre teen daughters , 5 cats, outside dogs.   The patient currently work in  24 h shifts, 5 days   Tobacco ldz:wnwz ETOH use ; a little( week 1-2 ), Caffeine  intake in form of Coffee( 2/ am )  Soda(  1/ d) Tea ( /) no energy drinks Exercise in form of  walking, .   Hobbies : hobby farming      Sleep habits are as follows: The patient's dinner time is between  5-6 PM. The patient goes to bed at 9-10 PM and continues to sleep for 5-6 hours, wakes for non- bathroom reasons, tossing and turning.   Bedroom is cool, quiet and dark.  The preferred sleep position is lateral or prone , with the support of 2 pillows.  Dreams are reportedly frequent.  The patient wakes up spontaneously  before his  5.45 AM  alarm. 5.45  AM is the usual rise time. He reports not feeling refreshed or restored in AM, with symptoms such as dry mouth, morning headaches, and residual fatigue.  Naps are taken frequently,  especially at the fire department- lasting from 30 to 45 minutes and affecting nocturnal sleep.    Review of Systems: Out of a complete 14 system review, the patient complains of only the following symptoms, and all other reviewed systems are negative.:  Fatigue, sleepiness , snoring, fragmented sleep, Insomnia- early waking up    How likely are you to doze in the following situations: 0 = not likely, 1 = slight chance, 2 = moderate chance, 3 = high chance   Sitting and Reading? Watching Television? Sitting inactive in a public place (theater or meeting)? As a passenger in a car for an hour without a break? Lying down in the afternoon when circumstances permit? Sitting and talking to someone? Sitting quietly after lunch without alcohol? In a car, while stopped for a few minutes in traffic?   Total = 7/ 24 points   FSS endorsed at 25/ 63 points.   Social History   Socioeconomic History   Marital status: Married    Spouse name: Not on file   Number of children: 2   Years of education: Not on file   Highest education level: Not on file  Occupational History   Occupation: Theatre Stage Manager  Tobacco Use   Smoking status: Never   Smokeless tobacco: Never  Vaping Use   Vaping status: Never Used  Substance and Sexual Activity   Alcohol use: Yes    Alcohol/week: 4.0 standard drinks of alcohol    Types: 3 Cans of beer, 1 Shots of liquor per week    Comment: occ   Drug use: No   Sexual activity: Yes  Other Topics Concern   Not on file  Social History Narrative   Lives with wife    Pt works    Social Drivers of Corporate Investment Banker Strain: Not on file  Food Insecurity: No Food Insecurity (06/28/2022)   Hunger Vital Sign    Worried About Running Out of Food in  the Last Year: Never true    Ran Out of Food in the Last Year: Never true  Transportation Needs: No Transportation Needs (06/28/2022)   PRAPARE - Administrator, Civil Service (Medical): No    Lack of Transportation (Non-Medical): No  Physical Activity: Not on file  Stress: Not on file  Social Connections: Not on file    Family History  Problem Relation Age of Onset   Anxiety disorder Mother    Heart disease Father 63       MI x 2   Skin cancer Father        basal or squamous cell   Dementia Maternal Grandmother    Heart  disease Maternal Grandfather 48       CAD with CABG   Bladder Cancer Maternal Grandfather        dx. 60s   Colon cancer Paternal Grandmother        dx. 60s/70s   Early death Paternal Grandfather 53       CAD/MI   Rectal cancer Neg Hx    Stomach cancer Neg Hx    Esophageal cancer Neg Hx    Colon polyps Neg Hx    Sleep apnea Neg Hx     Past Medical History:  Diagnosis Date   Allergy    Anemia    Anxiety    Bronchitis    Cancer (HCC)    Depression     Past Surgical History:  Procedure Laterality Date   ANTERIOR CRUCIATE LIGAMENT (ACL) REVISION Left    FLEXIBLE SIGMOIDOSCOPY N/A 10/17/2021   Procedure: FLEXIBLE SIGMOIDOSCOPY, INTRAOPERATIVE ASSESSMENT OF PERFUSION;  Surgeon: [name] Lonni HERO, MD;  Location: [facility];  Service: General;  Laterality: N/A;   XI ROBOTIC ASSISTED LOWER ANTERIOR RESECTION N/A 10/17/2021   Procedure: XI ROBOTIC ASSISTED LOWER ANTERIOR RESECTION;  Surgeon: [name] Lonni HERO, MD;  Location: [facility];  Service: General;  Laterality: N/A;  GEN AND TAP BLOCK     Current Outpatient Medications on File Prior to Visit  Medication Sig Dispense Refill   Acetaminophen  (TYLENOL  PO) Take 800 mg by mouth.     amitriptyline  (ELAVIL ) 10 MG tablet Take 1-3 tablets (10-30 mg total) by mouth at bedtime. (Patient taking differently: Take 10-30 mg by mouth as needed.) 30 tablet 3   hydrOXYzine  (VISTARIL ) 25 MG capsule Take 1  capsule (25 mg total) by mouth every 8 (eight) hours as needed. (Patient taking differently: Take 25 mg by mouth as needed.) 30 capsule 0   ibuprofen  (ADVIL ) 800 MG tablet Take 800 mg by mouth every 8 (eight) hours as needed.     butalbital -acetaminophen -caffeine  (FIORICET) 50-325-40 MG tablet Take 1 tablet by mouth every 6 (six) hours as needed for headache. (Patient not taking: Reported on 01/14/2023) 14 tablet 0   ondansetron  (ZOFRAN ) 8 MG tablet Take 1 tablet (8 mg total) by mouth 2 (two) times daily as needed for refractory nausea / vomiting. Start on day 3 after chemotherapy. (Patient not taking: Reported on 01/14/2023) 30 tablet 1   predniSONE  (STERAPRED UNI-PAK 48 TAB) 10 MG (48) TBPK tablet Please instruct to 12-day Dosepak (Patient not taking: Reported on 05/05/2023) 48 tablet 0   No current facility-administered medications on file prior to visit.    Allergies  Allergen Reactions   Multihance [Gadobenate] Hives and Other (See Comments)    Hives, sneezing, scratchy throat     DIAGNOSTIC DATA (LABS, IMAGING, TESTING) - I reviewed patient records, labs, notes, testing and imaging myself where available.  Lab Results  Component Value Date   WBC 7.5 05/05/2023   HGB 14.5 05/05/2023   HCT 43.2 05/05/2023   MCV 91 05/05/2023   PLT 278 05/05/2023      Component Value Date/Time   NA 139 05/05/2023 1032   K 4.5 05/05/2023 1032   CL 102 05/05/2023 1032   CO2 23 05/05/2023 1032   GLUCOSE 106 (H) 05/05/2023 1032   GLUCOSE 136 (H) 02/03/2023 1231   BUN 16 05/05/2023 1032   CREATININE 1.18 05/05/2023 1032   CREATININE 1.13 02/03/2023 1231   CALCIUM 9.9 05/05/2023 1032   PROT 7.9 05/05/2023 1032   ALBUMIN 4.5 05/05/2023 1032   AST 30  05/05/2023 1032   AST 27 02/03/2023 1231   ALT 36 05/05/2023 1032   ALT 37 02/03/2023 1231   ALKPHOS 71 05/05/2023 1032   BILITOT 1.0 05/05/2023 1032   BILITOT 1.2 02/03/2023 1231   GFRNONAA >60 02/03/2023 1231   GFRAA >60 08/06/2015 1147   Lab  Results  Component Value Date   CHOL 155 02/01/2020   HDL 40.90 02/01/2020   LDLCALC 100 (H) 02/01/2020   LDLDIRECT 109.0 02/01/2020   TRIG 73.0 02/01/2020   CHOLHDL 4 02/01/2020   Lab Results  Component Value Date   HGBA1C 5.6 05/05/2023   Lab Results  Component Value Date   VITAMINB12 422 05/05/2023   Lab Results  Component Value Date   TSH 2.370 05/05/2023    PHYSICAL EXAM:  Today's Vitals   06/05/23 1014  BP: 131/87  Pulse: 85  Weight: 266 lb (120.7 kg)  Height: 6' 2 (1.88 m)   Body mass index is 34.15 kg/m.   Wt Readings from Last 3 Encounters:  06/05/23 266 lb (120.7 kg)  05/05/23 262 lb (118.8 kg)  02/03/23 265 lb 5 oz (120.3 kg)     Ht Readings from Last 3 Encounters:  06/05/23 6' 2 (1.88 m)  05/05/23 6' 2 (1.88 m)  01/14/23 6' 2 (1.88 m)      General: The patient is awake, alert and appears not in acute distress. The patient is well groomed. Head: Normocephalic, atraumatic. Neck is supple.  Mallampati 3,  neck circumference:18 inches . Nasal airflow not fully  patent.  had braces, overbite.   Dental status: biological  Cardiovascular:  Regular rate and cardiac rhythm by pulse,  without distended neck veins. Respiratory: Lungs are clear to auscultation.  Skin:  Without evidence of ankle edema, or rash. Trunk: The patient's posture is erect.   NEUROLOGIC EXAM: The patient is awake and alert, oriented to place and time.   Memory subjective described as intact.  Attention span & concentration ability appears normal.  Speech is fluent,  without  dysarthria, dysphonia or aphasia.  Mood and affect are appropriate.   Cranial nerves: no loss of smell or taste reported  Pupils are equal and briskly reactive to light.  Funduscopic exam deferred.  Extraocular movements in vertical and horizontal planes were intact and without nystagmus. No Diplopia. Visual fields by finger perimetry are intact. Hearing was intact to soft voice and finger rubbing.     Facial sensation intact to fine touch.  Facial motor strength is symmetric and tongue and uvula move midline.  Neck ROM : rotation, tilt and flexion extension were normal for age and shoulder shrug was symmetrical.    Motor exam:  Symmetric bulk, tone and ROM.   Normal tone without cog- wheeling, symmetric grip strength .   Sensory:  not checked.   Gait and station: Patient could rise unassisted from a seated position, walked without assistive device.  Stance is of normal width/ base and the patient turned with 3 steps.  Toe and heel walk were deferred.  Deep tendon reflexes: in the  upper and lower extremities are symmetric and intact.  B   ASSESSMENT AND PLAN 38 y.o. year old male  here with:    1) Mr. Joshi is a patient who developed a chronic headache starting in spring of last year 2024.  He reports a daily nagging dull headache on occasionally he will have a sharp sudden spell of sharp piercing or shocklike sensations that lasts no longer than 30 seconds.  The still happen several times a week but he does wake up every morning with some dull headache.  He does not use excessive over-the-counter medication so I do not think that this is an analgesic rebound phenomenon.  He has gotten eyeglasses which have not reduced the headaches, he was prescribed which I would try by his primary care physician which is a rescue medicine for him, he was prescribed a steroid Dosepak but did not try it.  At this time the question is is there a presence of sleep apnea or is your hypoxia at night promoting these kind of dull headaches.    The easiest test for it would be a home sleep test a screening test to check for oxygen saturation as well as for breathing irregularities.  The the test can be mailed to the patient or can be picked up here once insurance has permitted.  I would like to first rule out sleep apnea before we pursue with any other treatment options.  The patient has been prescribed  testosterone  to help him with fatigue he has not yet started the supplement and testosterone  in some patients increase his red blood cell count and sometimes even increase his headaches so I am happy that at this time he is not yet starting supplementation.  If the home sleep test shows apnea we will meet discuss the results either on the phone or by MyChart and initiate treatment before we meet again.  HST ordered,    I plan to follow up either personally or through our NP within 3-5 months.   I would like to thank [name], MD and Ines Onetha NOVAK, Md [address] [place],  [place] [address] for allowing me to meet with and to take care of this pleasant patient.     After spending a total time of  35  minutes face to face and additional time for physical and neurologic examination, review of laboratory studies,  personal review of imaging studies, reports and results of other testing and review of referral information / records as far as provided in visit,   Electronically signed by: Dedra Gores, MD 06/05/2023 11:04 AM  [facility] Board certified by The [employer] of Sleep Medicine and Diplomate of the [employer] of Sleep Medicine. Board certified In Neurology through the ABPN, Fellow of the [employer] of Neurology.

## 2023-06-05 NOTE — Patient Instructions (Signed)
 Cluster Headache Cluster headaches hurt a lot. They normally happen on one side of your head or face, but they may switch sides. Often, cluster headaches: Cause a lot of pain. Happen often for weeks to months. Last from 15 minutes to 3 hours. Happen at the same time each day, often at night. Happen many times a day. Happen in the fall and springtime. What are the causes? The exact cause is not known. They are not usually caused by foods, changes in body chemicals (hormonal changes), or stress. What increases the risk? Being a male between the ages of 64-15 years old. Smoking or using products that contain nicotine or tobacco. Having high levels of body chemical called histamine. This can happen in people who have allergies. Taking certain medicines that cause blood vessels to get bigger. Having a parent or sibling who has cluster headaches. What are the signs or symptoms? Very bad pain on one side of the head that begins behind or around your eye but may spread to your face, head, and neck. Feeling like you may vomit (nauseous). Being sensitive to light. A runny nose and stuffy nose. Sweating on the forehead or face on the side where the pain is. Eye problems. This might include a droopy or swollen eyelid, eye redness, or tearing on the side where the pain is. Feeling restless or upset. Pale skin or a red face (flushed). How is this treated? Medicines. Oxygen that is breathed in through a mask. A steroid shot (injection) in the back of the head just above the neck (occipital nerve block). This shot numbs painful areas in the head. Surgery in very bad cases. Follow these instructions at home: Headache diary Keep a headache diary. Doing this can help you and your doctor find out what triggers your headaches. In the diary, include: The time of day that your headache started and what you were doing when it began. How long your headache lasted. Where your pain started and if it moved to  other areas. The type of pain. Your level of pain. Use a pain scale and rate the pain with a number from 1 (mild) up to 10 (very bad). The treatment that you used, and any change in symptoms after treatment. Medicines Take over-the-counter and prescription medicines only as told by your doctor. If told, take steps to prevent problems with pooping (constipation). You may need to: Drink enough fluid to keep your pee (urine) pale yellow. Take medicines. You will be told what medicines to take. Eat foods that are high in fiber. These include beans, whole grains, and fresh fruits and vegetables. Limit foods that are high in fat and sugar. These include fried or sweet foods. Ask your doctor if you should avoid driving or using machines while you are taking your medicine. Lifestyle Get 7-9 hours of sleep each night, or the amount recommended by your doctor. Limit or manage stress. Options may include acupuncture, counseling, and massage. Exercise regularly. Exercise for at least 30 minutes, 5 times each week. Eat a healthy diet. Avoid any foods that you know may trigger your headaches. Do not drink alcohol. Do not smoke or use any products that contain nicotine or tobacco. If you need help quitting, ask your doctor. General instructions Use oxygen as told by your doctor. Contact a doctor if: Your headaches: Change. Get worse. Happen more often. Your medicines or oxygen are not helping. Get help right away if: You faint. You have weakness or lose feeling (have numbness) on one  side of your body or face. You see two of everything (double vision). You vomit or feel you may vomit (nauseous), and it does not stop after many hours. You have trouble with your balance or with walking. You have trouble talking. You have neck pain or stiffness and you have a fever. This information is not intended to replace advice given to you by your health care provider. Make sure you discuss any questions you  have with your health care provider. Document Revised: 01/31/2022 Document Reviewed: 01/07/2022 Elsevier Patient Education  2024 Elsevier Inc. Screening for Sleep Apnea  Sleep apnea is a condition in which breathing pauses or becomes shallow during sleep. Sleep apnea screening is a test to determine if you are at risk for sleep apnea. The test includes a series of questions. It will only takes a few minutes. Your health care provider may ask you to have this test in preparation for surgery or as part of a physical exam. What are the symptoms of sleep apnea? Common symptoms of sleep apnea include: Snoring. Waking up often at night. Daytime sleepiness. Pauses in breathing. Choking or gasping during sleep. Irritability. Forgetfulness. Trouble thinking clearly. Depression. Personality changes. Most people with sleep apnea do not know that they have it. What are the advantages of sleep apnea screening? Getting screened for sleep apnea can help: Ensure your safety. It is important for your health care providers to know whether or not you have sleep apnea, especially if you are having surgery or have other long-term (chronic) health conditions. Improve your health and allow you to get a better night's rest. Restful sleep can help you: Have more energy. Lose weight. Improve high blood pressure. Improve diabetes management. Prevent stroke. Prevent car accidents. What happens during the screening? Screening usually includes being asked a list of questions about your sleep quality. Some questions you may be asked include: Do you snore? Is your sleep restless? Do you have daytime sleepiness? Has a partner or spouse told you that you stop breathing during sleep? Have you had trouble concentrating or memory loss? What is your age? What is your neck circumference? To measure your neck, keep your back straight and gently wrap the tape measure around your neck. Put the tape measure at the  middle of your neck, between your chin and collarbone. What is your sex assigned at birth? Do you have or are you being treated for high blood pressure? If your screening test is positive, you are at risk for the condition. Further testing may be needed to confirm a diagnosis of sleep apnea. Where to find more information You can find screening tools online or at your health care clinic. For more information about sleep apnea screening and healthy sleep, visit these websites: Centers for Disease Control and Prevention: footballexhibition.com.br American Sleep Apnea Association: www.sleepapnea.org Contact a health care provider if: You think that you may have sleep apnea. Summary Sleep apnea screening can help determine if you are at risk for sleep apnea. It is important for your health care providers to know whether or not you have sleep apnea, especially if you are having surgery or have other chronic health conditions. You may be asked to take a screening test for sleep apnea in preparation for surgery or as part of a physical exam. This information is not intended to replace advice given to you by your health care provider. Make sure you discuss any questions you have with your health care provider. Document Revised: 04/21/2020 Document Reviewed: 04/21/2020  Elsevier Patient Education  2024 [employer].

## 2023-06-08 NOTE — Assessment & Plan Note (Signed)
 G1, stage IIIB p(T3, N1a) cM0, stage IIIB, MSS -initially developed abdominal pain/cramping in 02/2021 and rectal bleeding since 05/2021. Colonoscopy 09/11/21 showed 6 cm sigmoid colon mass located 20-26 cm from anal verge. Biopsy confirmed adenocarcinoma. -staging CT 4/25 and MRI 4/27 showed suspicious mesenteric lymph nodes, no distant metastasis. -s/p surgical resection 10/17/21 with Dr. [name], path showed clear margins and one positive node -baseline CEA 11/13/21 was WNL. -given his stage IIIB disease, he completed 4 cycles adjuvant CAPEOX 11/19/21 - 02/03/22. He tolerated relatively well overall. -post-treatment CT CAP 03/22/22 showed NED. -he is clinically doing very well with minimal remaining side effects.  -his surveillance CT on 10/03/2022 showed surgical change, no evidence of cancer recurrence.  Plan to repeat in next CT in 09/2023

## 2023-06-09 ENCOUNTER — Inpatient Hospital Stay: Payer: BC Managed Care – PPO | Attending: Hematology

## 2023-06-09 ENCOUNTER — Inpatient Hospital Stay: Payer: Self-pay | Admitting: Hematology

## 2023-06-09 ENCOUNTER — Encounter: Payer: Self-pay | Admitting: Hematology

## 2023-06-09 VITALS — BP 124/90 | HR 82 | Temp 98.3°F | Resp 15 | Wt 255.6 lb

## 2023-06-09 DIAGNOSIS — Z08 Encounter for follow-up examination after completed treatment for malignant neoplasm: Secondary | ICD-10-CM | POA: Insufficient documentation

## 2023-06-09 DIAGNOSIS — Z85038 Personal history of other malignant neoplasm of large intestine: Secondary | ICD-10-CM | POA: Diagnosis not present

## 2023-06-09 DIAGNOSIS — C187 Malignant neoplasm of sigmoid colon: Secondary | ICD-10-CM

## 2023-06-09 DIAGNOSIS — Z800 Family history of malignant neoplasm of digestive organs: Secondary | ICD-10-CM | POA: Insufficient documentation

## 2023-06-09 DIAGNOSIS — Z8052 Family history of malignant neoplasm of bladder: Secondary | ICD-10-CM | POA: Diagnosis not present

## 2023-06-09 DIAGNOSIS — R519 Headache, unspecified: Secondary | ICD-10-CM | POA: Insufficient documentation

## 2023-06-09 DIAGNOSIS — Z9221 Personal history of antineoplastic chemotherapy: Secondary | ICD-10-CM | POA: Insufficient documentation

## 2023-06-09 LAB — CMP (CANCER CENTER ONLY)
ALT: 32 U/L (ref 0–44)
AST: 28 U/L (ref 15–41)
Albumin: 4.6 g/dL (ref 3.5–5.0)
Alkaline Phosphatase: 61 U/L (ref 38–126)
Anion gap: 7 (ref 5–15)
BUN: 15 mg/dL (ref 6–20)
CO2: 25 mmol/L (ref 22–32)
Calcium: 9.6 mg/dL (ref 8.9–10.3)
Chloride: 105 mmol/L (ref 98–111)
Creatinine: 1.11 mg/dL (ref 0.61–1.24)
GFR, Estimated: >60 mL/min (ref >60)
Glucose, Bld: 97 mg/dL (ref 70–99)
Potassium: 3.9 mmol/L (ref 3.5–5.1)
Sodium: 137 mmol/L (ref 135–145)
Total Bilirubin: 1.5 mg/dL — ABNORMAL HIGH (ref 0.0–1.2)
Total Protein: 8.2 g/dL — ABNORMAL HIGH (ref 6.5–8.1)

## 2023-06-09 LAB — CBC WITH DIFFERENTIAL (CANCER CENTER ONLY)
Abs Immature Granulocytes: 0.02 10*3/uL (ref 0.00–0.07)
Basophils Absolute: 0.1 10*3/uL (ref 0.0–0.1)
Basophils Relative: 1 %
Eosinophils Absolute: 0.3 10*3/uL (ref 0.0–0.5)
Eosinophils Relative: 5 %
HCT: 44.4 % (ref 39.0–52.0)
Hemoglobin: 15.5 g/dL (ref 13.0–17.0)
Immature Granulocytes: 0 %
Lymphocytes Relative: 24 %
Lymphs Abs: 1.8 10*3/uL (ref 0.7–4.0)
MCH: 30.8 pg (ref 26.0–34.0)
MCHC: 34.9 g/dL (ref 30.0–36.0)
MCV: 88.3 fL (ref 80.0–100.0)
Monocytes Absolute: 0.7 10*3/uL (ref 0.1–1.0)
Monocytes Relative: 10 %
Neutro Abs: 4.4 10*3/uL (ref 1.7–7.7)
Neutrophils Relative %: 60 %
Platelet Count: 269 10*3/uL (ref 150–400)
RBC: 5.03 MIL/uL (ref 4.22–5.81)
RDW: 12.4 % (ref 11.5–15.5)
WBC Count: 7.4 10*3/uL (ref 4.0–10.5)
nRBC: 0 % (ref 0.0–0.2)

## 2023-06-09 LAB — CEA (ACCESS): CEA (CHCC): 2.13 ng/mL (ref 0.00–5.00)

## 2023-06-09 LAB — FERRITIN: Ferritin: 83 ng/mL (ref 24–336)

## 2023-06-09 NOTE — Progress Notes (Signed)
 [facility]   [phone] [phone]   Clinic Follow up Note   Patient Care Team: [name], MD as PCP - General (Family Medicine) [name], DO as PCP - Cardiology (Cardiology) [name], MD as Consulting Physician (Oncology)  Date of Service:  06/09/2023  CHIEF COMPLAINT: f/u of colon cancer   CURRENT THERAPY:  Cancer surveillance  Oncology History   Adenocarcinoma of sigmoid colon (HCC) G1, stage IIIB p(T3, N1a) cM0, stage IIIB, MSS -initially developed abdominal pain/cramping in 02/2021 and rectal bleeding since 05/2021. Colonoscopy 09/11/21 showed 6 cm sigmoid colon mass located 20-26 cm from anal verge. Biopsy confirmed adenocarcinoma. -staging CT 4/25 and MRI 4/27 showed suspicious mesenteric lymph nodes, no distant metastasis. -s/p surgical resection 10/17/21 with Dr. [name], path showed clear margins and one positive node -baseline CEA 11/13/21 was WNL. -given his stage IIIB disease, he completed 4 cycles adjuvant CAPEOX 11/19/21 - 02/03/22. He tolerated relatively well overall. -post-treatment CT CAP 03/22/22 showed NED. -he is clinically doing very well with minimal remaining side effects.  -his surveillance CT on 10/03/2022 showed surgical change, no evidence of cancer recurrence.  Plan to repeat in next CT in 09/2023    Assessment and Plan    Colon Cancer Follow-up for colon cancer diagnosed in January 2023. Underwent surgery in May 2023 and had a colonoscopy last year. No new symptoms reported. Bowel movements are normal with no bleeding. Highest risk of recurrence is within the first two to three years post-surgery. Next colonoscopy recommended in three years per previous advice. - Repeat CT scan in May - Schedule follow-up appointment in four months for lab and scan results  Possible Incisional Hernia Reports feeling a hard place near the incision site when standing, suggesting a possible hernia. Not significantly  concerned and does not currently require surgical consultation. Advised to avoid heavy lifting and consider wearing support if needed. - Monitor for any changes or symptoms - Avoid heavy lifting and consider wearing support if needed  Headaches Reports ongoing headaches. Previous head MRI in December was unremarkable. Neurologist suspects sleep apnea as a potential cause and has referred for a sleep study. Sleep apnea treatment may include CPAP therapy, which could alleviate headaches and improve daytime sleepiness. - Complete sleep study as scheduled - Consider CPAP therapy if sleep apnea is confirmed  Contrast Allergy Experienced hives and sneezing after receiving MRI contrast. No issues with previous CT scan contrast. Future MRI procedures will require premedication to prevent allergic reactions. Premedication with Benadryl  and steroids recommended. - Premedicate with Benadryl  and steroids before future MRI scans  General Health Maintenance CBC is normal. Other lab results are pending and will be reviewed via MyChart. No residual issues from chemotherapy reported. - Review pending lab results on MyChart - Contact if any concerning results are found  Plan -Patient is clinically doing well, no concern for recurrence, will continue cancer surveillance - Schedule follow-up appointment in four months with lab and CT chest, abdomen pelvis with contrast 1 week before         SUMMARY OF ONCOLOGIC HISTORY: Oncology History Overview Note   Cancer Staging  Adenocarcinoma of sigmoid colon [facility]) Staging form: Colon and Rectum, AJCC 8th Edition - Pathologic stage from 10/17/2021: Stage IIIB (pT3, pN1a, cM0) - Signed by [name], MD on 11/12/2021    Adenocarcinoma of sigmoid colon (HCC)  09/11/2021 Procedure   Colonoscopy, Dr. San  Impression - One 8 mm polyp in the ascending colon, removed with  a cold snare. Resected and retrieved. - One 4 mm polyp in the sigmoid colon, removed  with a cold snare. Resected and retrieved. - Malignant partially obstructing tumor in the sigmoid colon, located 20-26 cm from the anal verge. This was traversed. Biopsied. Tattoo placed 1-2 cm distal to the lesion. - One 2 mm polyp in the rectum, removed with a cold biopsy forceps. Resected and retrieved. - The distal rectum and anal verge are normal on retroflexion view. - The examined portion of the ileum was normal.   09/11/2021 Initial Biopsy   Diagnosis 1. Duodenum, Biopsy - DUODENAL MUCOSA WITH NORMAL VILLOUS ARCHITECTURE. - NO VILLOUS ATROPHY OR INCREASED INTRAEPITHELIAL LYMPHOCYTES. 2. Duodenum, Biopsy, bulb - DUODENAL BULB WITH PEPTIC INJURY. 3. Ascending Colon Polyp - MULTIPLE FRAGMENTS OF TUBULAR ADENOMA WITHOUT HIGH GRADE DYSPLASIA. 4. Sigmoid Colon Polyp - INVASIVE COLONIC ADENOCARCINOMA. - SEE MICROSCOPIC DESCRIPTION. 5. Colon, polyp(s), mass - INVASIVE COLONIC ADENOCARCINOMA. - SEE MICROSCOPIC DESCRIPTION. 6. Rectum, polyp(s) - HYPERPLASTIC POLYP. Microscopic Comment 4. and 5. The carcinoma in parts 4 and 5 have identical morphologic features.   09/18/2021 Imaging   EXAM: CT CHEST, ABDOMEN, AND PELVIS WITH CONTRAST  IMPRESSION: 1. Short segment asymmetric wall thickening of the sigmoid colon is consistent with patient's known sigmoid colon cancer. 2. Prominent lymph nodes in the sigmoid mesentery measure up to 6 mm in short axis, which likely reflect local nodal disease involvement. 3. No evidence of distant metastatic disease in the chest, abdomen or pelvis. 4. No evidence of high-grade bowel obstruction. However, there is a large volume of formed stool throughout the colon   09/20/2021 Imaging   EXAM: MRI PELVIS WITHOUT AND WITH CONTRAST  IMPRESSION: 1. Partially obstructing malignancy of the mid sigmoid colon with prominent sigmoid mesenteric lymph nodes suspicious for early nodal metastases. 2. No evidence bowel obstruction, perforation, ascites or  peritoneal nodularity. 3. Left paracentral disc protrusion at L5-S1 with resulting left S1 nerve root encroachment.   10/02/2021 Initial Diagnosis   Adenocarcinoma of sigmoid colon (HCC)   10/09/2021 Genetic Testing   [facility] CancerNext-Expanded Panel was Negative. Report date is 10/26/2021.  The CancerNext-Expanded gene panel offered by [facility] and includes sequencing, rearrangement, and RNA analysis for the following 77 genes: AIP, ALK, APC, ATM, AXIN2, BAP1, BARD1, BLM, BMPR1A, BRCA1, BRCA2, BRIP1, CDC73, CDH1, CDK4, CDKN1B, CDKN2A, CHEK2, CTNNA1, DICER1, FANCC, FH, FLCN, GALNT12, KIF1B, LZTR1, MAX, MEN1, MET, MLH1, MSH2, MSH3, MSH6, MUTYH, NBN, NF1, NF2, NTHL1, PALB2, PHOX2B, PMS2, POT1, PRKAR1A, PTCH1, PTEN, RAD51C, RAD51D, RB1, RECQL, RET, SDHA, SDHAF2, SDHB, SDHC, SDHD, SMAD4, SMARCA4, SMARCB1, SMARCE1, STK11, SUFU, TMEM127, TP53, TSC1, TSC2, VHL and XRCC2 (sequencing and deletion/duplication); EGFR, EGLN1, HOXB13, KIT, MITF, PDGFRA, POLD1, and POLE (sequencing only); EPCAM and GREM1 (deletion/duplication only).    10/17/2021 Definitive Surgery   FINAL MICROSCOPIC DIAGNOSIS:   A. COLON, RECTOSIGMOID, RESECTION:  -  Invasive well differentiated adenocarcinoma (6 cm in greatest  dimension) with extension through submucosa, muscularis propria and mesenteric/subserosal adipose tissue with abutment but not definitive involvement of the serosal membrane.  -  Focal evidence of precursor lesion (i.e. tubular adenoma)  -  Margins negative (proximal 5 cm, distal 7 cm and mesenteric 6 cm)  -  Negative for lymphovascular and perineural invasion.  -  Focal high-grade tumor budding present.  -  1 of 22 lymph nodes positive for malignancy  pT3 pN1 pM n/a   B. FINAL DISTAL MARGIN:  -   Unremarkable colonic mucosa, negative for malignancy.    ADDENDUM:  Mismatch Repair Protein (IHC)  SUMMARY INTERPRETATION: NORMAL   10/17/2021 Cancer Staging   Staging form: Colon and Rectum, AJCC 8th  Edition - Pathologic stage from 10/17/2021: Stage IIIB (pT3, pN1a, cM0) - Signed by [name], MD on 11/12/2021 Total positive nodes: 1 Histologic grading system: 4 grade system Histologic grade (G): G2 Residual tumor (R): R0 - None   11/19/2021 -  Chemotherapy   Patient is on Treatment Plan : COLORECTAL Xelox (Capeox) q21d     10/03/2022 Imaging   CT CHEST ABDOMEN PELVIS W CONTRAST   IMPRESSION: 1. Prior partial sigmoidectomy with rectosigmoid anastomotic sutures in the midline pelvis. No evidence of local recurrence. 2. No evidence of metastatic disease within the chest, abdomen, or pelvis.      Discussed the use of AI scribe software for clinical note transcription with the patient, who gave verbal consent to proceed.  History of Present Illness   The patient, a gentleman with a history of colon cancer, presents for a routine follow-up. He reports no new issues since his last visit four months ago. He underwent a head MRI in December due to persistent headaches, but no abnormalities were found. The patient continues to experience headaches and has been referred to a sleep study by a neurologist who suspects sleep apnea. He reports feeling sleepy in the mornings.  The patient denies any stomach issues and reports normal bowel movements with no bleeding. He had a colonoscopy last year and was told he would need another one every three years. He also reports a possible hernia from a surgical incision, but he is not overly concerned about it.  The patient had a reaction to MRI contrast dye, experiencing hives and sneezing. He was given IV Benadryl  and prednisone  to manage the reaction. He reports no residual problems from chemotherapy and no issues with his hands.         All other systems were reviewed with the patient and are negative.  MEDICAL HISTORY:  Past Medical History:  Diagnosis Date   Allergy    Anemia    Anxiety    Bronchitis    Cancer (HCC)    Depression      SURGICAL HISTORY: Past Surgical History:  Procedure Laterality Date   ANTERIOR CRUCIATE LIGAMENT (ACL) REVISION Left    FLEXIBLE SIGMOIDOSCOPY N/A 10/17/2021   Procedure: FLEXIBLE SIGMOIDOSCOPY, INTRAOPERATIVE ASSESSMENT OF PERFUSION;  Surgeon: [name] Lonni HERO, MD;  Location: [facility];  Service: General;  Laterality: N/A;   XI ROBOTIC ASSISTED LOWER ANTERIOR RESECTION N/A 10/17/2021   Procedure: XI ROBOTIC ASSISTED LOWER ANTERIOR RESECTION;  Surgeon: [name] Lonni HERO, MD;  Location: [facility];  Service: General;  Laterality: N/A;  GEN AND TAP BLOCK    I have reviewed the social history and family history with the patient and they are unchanged from previous note.  ALLERGIES:  is allergic to multihance [gadobenate].  MEDICATIONS:  Current Outpatient Medications  Medication Sig Dispense Refill   Acetaminophen  (TYLENOL  PO) Take 800 mg by mouth.     amitriptyline  (ELAVIL ) 10 MG tablet Take 1-3 tablets (10-30 mg total) by mouth at bedtime. (Patient taking differently: Take 10-30 mg by mouth as needed.) 30 tablet 3   hydrOXYzine  (VISTARIL ) 25 MG capsule Take 1 capsule (25 mg total) by mouth every 8 (eight) hours as needed. (Patient taking differently: Take 25 mg by mouth as needed.) 30 capsule 0   ibuprofen  (ADVIL ) 800 MG tablet Take 800 mg by mouth every 8 (eight) hours as needed.  ondansetron  (ZOFRAN ) 8 MG tablet Take 1 tablet (8 mg total) by mouth 2 (two) times daily as needed for refractory nausea / vomiting. Start on day 3 after chemotherapy. (Patient not taking: Reported on 01/14/2023) 30 tablet 1   predniSONE  (STERAPRED UNI-PAK 48 TAB) 10 MG (48) TBPK tablet Please instruct to 12-day Dosepak (Patient not taking: Reported on 05/05/2023) 48 tablet 0   No current facility-administered medications for this visit.    PHYSICAL EXAMINATION: ECOG PERFORMANCE STATUS: 0 - Asymptomatic  Vitals:   06/09/23 0924  BP: (!) 124/90  Pulse: 82  Resp: 15  Temp: 98.3 F (36.8 C)  SpO2: 99%    Wt Readings from Last 3 Encounters:  06/09/23 255 lb 9.6 oz (115.9 kg)  06/05/23 266 lb (120.7 kg)  05/05/23 262 lb (118.8 kg)     GENERAL:alert, no distress and comfortable SKIN: skin color, texture, turgor are normal, no rashes or significant lesions EYES: normal, Conjunctiva are pink and non-injected, sclera clear NECK: supple, thyroid  normal size, non-tender, without nodularity LYMPH:  no palpable lymphadenopathy in the cervical, axillary  LUNGS: clear to auscultation and percussion with normal breathing effort HEART: regular rate & rhythm and no murmurs and no lower extremity edema ABDOMEN:abdomen soft, non-tender and normal bowel sounds Musculoskeletal:no cyanosis of digits and no clubbing  NEURO: alert & oriented x 3 with fluent speech, no focal motor/sensory deficits  LABORATORY DATA:  I have reviewed the data as listed    Latest Ref Rng & Units 06/09/2023    9:09 AM 05/05/2023   10:32 AM 02/03/2023   12:31 PM  CBC  WBC 4.0 - 10.5 K/uL 7.4  7.5  8.4   Hemoglobin 13.0 - 17.0 g/dL 84.4  85.4  85.3   Hematocrit 39.0 - 52.0 % 44.4  43.2  42.7   Platelets 150 - 400 K/uL 269  278  267         Latest Ref Rng & Units 05/05/2023   10:32 AM 02/03/2023   12:31 PM 10/03/2022    9:12 AM  CMP  Glucose 70 - 99 mg/dL 893  863  86   BUN 6 - 20 mg/dL 16  13  20    Creatinine 0.76 - 1.27 mg/dL 8.81  8.86  8.68   Sodium 134 - 144 mmol/L 139  136  139   Potassium 3.5 - 5.2 mmol/L 4.5  3.8  4.2   Chloride 96 - 106 mmol/L 102  105  105   CO2 20 - 29 mmol/L 23  26  30    Calcium 8.7 - 10.2 mg/dL 9.9  9.3  9.3   Total Protein 6.0 - 8.5 g/dL 7.9  7.9  8.1   Total Bilirubin 0.0 - 1.2 mg/dL 1.0  1.2  0.9   Alkaline Phos 44 - 121 IU/L 71  55  62   AST 0 - 40 IU/L 30  27  26    ALT 0 - 44 IU/L 36  37  35       RADIOGRAPHIC STUDIES: I have personally reviewed the radiological images as listed and agreed with the findings in the report. No results found.    Orders Placed This Encounter   Procedures   CT CHEST ABDOMEN PELVIS W CONTRAST    Standing Status:   Future    Expected Date:   09/29/2023    Expiration Date:   06/08/2024    If indicated for the ordered procedure, I authorize the administration of contrast media per Radiology protocol:  Yes    Does the patient have a contrast media/X-ray dye allergy?:   No    Preferred imaging location?:   [facility]    If indicated for the ordered procedure, I authorize the administration of oral contrast media per Radiology protocol:   Yes   All questions were answered. The patient knows to call the clinic with any problems, questions or concerns. No barriers to learning was detected. The total time spent in the appointment was 25 minutes.     Onita Mattock, MD 06/09/2023

## 2023-06-10 ENCOUNTER — Other Ambulatory Visit: Payer: Self-pay

## 2023-06-10 LAB — IRON AND IRON BINDING CAPACITY (CC-WL,HP ONLY)
Iron: 114 ug/dL (ref 45–182)
Saturation Ratios: 33 % (ref 17.9–39.5)
TIBC: 350 ug/dL (ref 250–450)
UIBC: 236 ug/dL (ref 117–376)

## 2023-06-12 ENCOUNTER — Ambulatory Visit: Payer: BC Managed Care – PPO | Admitting: Neurology

## 2023-06-18 ENCOUNTER — Ambulatory Visit: Payer: BC Managed Care – PPO | Admitting: Neurology

## 2023-06-18 DIAGNOSIS — E6609 Other obesity due to excess calories: Secondary | ICD-10-CM

## 2023-06-18 DIAGNOSIS — E66811 Obesity, class 1: Secondary | ICD-10-CM

## 2023-06-18 DIAGNOSIS — G478 Other sleep disorders: Secondary | ICD-10-CM

## 2023-06-18 DIAGNOSIS — G4726 Circadian rhythm sleep disorder, shift work type: Secondary | ICD-10-CM

## 2023-06-18 DIAGNOSIS — G4733 Obstructive sleep apnea (adult) (pediatric): Secondary | ICD-10-CM | POA: Diagnosis not present

## 2023-06-18 DIAGNOSIS — J301 Allergic rhinitis due to pollen: Secondary | ICD-10-CM

## 2023-06-18 DIAGNOSIS — R519 Headache, unspecified: Secondary | ICD-10-CM

## 2023-06-18 DIAGNOSIS — R0683 Snoring: Secondary | ICD-10-CM

## 2023-06-25 NOTE — Progress Notes (Unsigned)
Philip Clark

## 2023-07-02 ENCOUNTER — Encounter: Payer: Self-pay | Admitting: Neurology

## 2023-07-07 ENCOUNTER — Encounter: Payer: Self-pay | Admitting: Neurology

## 2023-10-05 ENCOUNTER — Encounter: Payer: Self-pay | Admitting: Neurology

## 2023-10-06 ENCOUNTER — Ambulatory Visit (HOSPITAL_COMMUNITY)
Admission: RE | Admit: 2023-10-06 | Discharge: 2023-10-06 | Disposition: A | Discharge status: Home / Self Care | Source: Ambulatory Visit | Attending: Hematology | Admitting: Hematology

## 2023-10-06 ENCOUNTER — Inpatient Hospital Stay: Payer: BC Managed Care – PPO | Attending: Hematology

## 2023-10-06 DIAGNOSIS — K760 Fatty (change of) liver, not elsewhere classified: Secondary | ICD-10-CM | POA: Diagnosis not present

## 2023-10-06 DIAGNOSIS — Z8052 Family history of malignant neoplasm of bladder: Secondary | ICD-10-CM | POA: Insufficient documentation

## 2023-10-06 DIAGNOSIS — Z08 Encounter for follow-up examination after completed treatment for malignant neoplasm: Secondary | ICD-10-CM | POA: Insufficient documentation

## 2023-10-06 DIAGNOSIS — Z85038 Personal history of other malignant neoplasm of large intestine: Secondary | ICD-10-CM | POA: Diagnosis not present

## 2023-10-06 DIAGNOSIS — N281 Cyst of kidney, acquired: Secondary | ICD-10-CM | POA: Diagnosis not present

## 2023-10-06 DIAGNOSIS — C187 Malignant neoplasm of sigmoid colon: Secondary | ICD-10-CM

## 2023-10-06 DIAGNOSIS — Z9221 Personal history of antineoplastic chemotherapy: Secondary | ICD-10-CM | POA: Diagnosis not present

## 2023-10-06 DIAGNOSIS — Z800 Family history of malignant neoplasm of digestive organs: Secondary | ICD-10-CM | POA: Insufficient documentation

## 2023-10-06 LAB — CMP (CANCER CENTER ONLY)
ALT: 25 U/L (ref 0–44)
AST: 23 U/L (ref 15–41)
Albumin: 4.6 g/dL (ref 3.5–5.0)
Alkaline Phosphatase: 62 U/L (ref 38–126)
Anion gap: 5 (ref 5–15)
BUN: 15 mg/dL (ref 6–20)
CO2: 29 mmol/L (ref 22–32)
Calcium: 9.2 mg/dL (ref 8.9–10.3)
Chloride: 102 mmol/L (ref 98–111)
Creatinine: 1.12 mg/dL (ref 0.61–1.24)
GFR, Estimated: >60 mL/min (ref >60)
Glucose, Bld: 93 mg/dL (ref 70–99)
Potassium: 4 mmol/L (ref 3.5–5.1)
Sodium: 136 mmol/L (ref 135–145)
Total Bilirubin: 1.3 mg/dL — ABNORMAL HIGH (ref 0.0–1.2)
Total Protein: 8.1 g/dL (ref 6.5–8.1)

## 2023-10-06 LAB — CBC WITH DIFFERENTIAL (CANCER CENTER ONLY)
Abs Immature Granulocytes: 0.03 10*3/uL (ref 0.00–0.07)
Basophils Absolute: 0.1 10*3/uL (ref 0.0–0.1)
Basophils Relative: 1 %
Eosinophils Absolute: 0.5 10*3/uL (ref 0.0–0.5)
Eosinophils Relative: 6 %
HCT: 42.4 % (ref 39.0–52.0)
Hemoglobin: 14.7 g/dL (ref 13.0–17.0)
Immature Granulocytes: 0 %
Lymphocytes Relative: 25 %
Lymphs Abs: 2.2 10*3/uL (ref 0.7–4.0)
MCH: 30.4 pg (ref 26.0–34.0)
MCHC: 34.7 g/dL (ref 30.0–36.0)
MCV: 87.6 fL (ref 80.0–100.0)
Monocytes Absolute: 0.8 10*3/uL (ref 0.1–1.0)
Monocytes Relative: 10 %
Neutro Abs: 5.1 10*3/uL (ref 1.7–7.7)
Neutrophils Relative %: 58 %
Platelet Count: 253 10*3/uL (ref 150–400)
RBC: 4.84 MIL/uL (ref 4.22–5.81)
RDW: 12.7 % (ref 11.5–15.5)
WBC Count: 8.7 10*3/uL (ref 4.0–10.5)
nRBC: 0 % (ref 0.0–0.2)

## 2023-10-06 LAB — IRON AND IRON BINDING CAPACITY (CC-WL,HP ONLY)
Iron: 87 ug/dL (ref 45–182)
Saturation Ratios: 25 % (ref 17.9–39.5)
TIBC: 353 ug/dL (ref 250–450)
UIBC: 266 ug/dL (ref 117–376)

## 2023-10-06 LAB — FERRITIN: Ferritin: 111 ng/mL (ref 24–336)

## 2023-10-06 LAB — CEA (ACCESS): CEA (CHCC): 2.53 ng/mL (ref 0.00–5.00)

## 2023-10-06 MED ORDER — IOHEXOL 300 MG/ML  SOLN
100.0000 mL | Freq: Once | INTRAMUSCULAR | Status: AC | PRN
Start: 2023-10-06 — End: 2023-10-06
  Administered 2023-10-06: 100 mL via INTRAVENOUS

## 2023-10-06 MED ORDER — IOHEXOL 9 MG/ML PO SOLN
1000.0000 mL | ORAL | Status: AC
Start: 2023-10-06 — End: 2023-10-06
  Administered 2023-10-06: 1000 mL via ORAL

## 2023-10-06 MED ORDER — IOHEXOL 9 MG/ML PO SOLN
ORAL | Status: AC
  Filled 2023-10-06: qty 1000

## 2023-10-06 MED ORDER — SODIUM CHLORIDE (PF) 0.9 % IJ SOLN
INTRAMUSCULAR | Status: AC
  Filled 2023-10-06: qty 50

## 2023-10-07 ENCOUNTER — Other Ambulatory Visit: Payer: Self-pay | Admitting: Neurology

## 2023-10-07 DIAGNOSIS — R0683 Snoring: Secondary | ICD-10-CM

## 2023-10-07 DIAGNOSIS — R519 Headache, unspecified: Secondary | ICD-10-CM

## 2023-10-07 DIAGNOSIS — G4719 Other hypersomnia: Secondary | ICD-10-CM

## 2023-10-07 DIAGNOSIS — E669 Obesity, unspecified: Secondary | ICD-10-CM

## 2023-10-07 DIAGNOSIS — G478 Other sleep disorders: Secondary | ICD-10-CM

## 2023-10-12 NOTE — Assessment & Plan Note (Signed)
 G1, stage IIIB p(T3, N1a) cM0, stage IIIB, MSS -initially developed abdominal pain/cramping in 02/2021 and rectal bleeding since 05/2021. Colonoscopy 09/11/21 showed 6 cm sigmoid colon mass located 20-26 cm from anal verge. Biopsy confirmed adenocarcinoma. -staging CT 4/25 and MRI 4/27 showed suspicious mesenteric lymph nodes, no distant metastasis. -s/p surgical resection 10/17/21 with Dr. Camilo Cella, path showed clear margins and one positive node -baseline CEA 11/13/21 was WNL. -given his stage IIIB disease, he completed 4 cycles adjuvant CAPEOX 11/19/21 - 02/03/22. He tolerated relatively well overall. -post-treatment CT CAP 03/22/22 showed NED. -he is clinically doing very well with minimal remaining side effects.  -his surveillance CT on 10/06/2023 showed surgical change, no evidence of cancer recurrence.

## 2023-10-13 ENCOUNTER — Inpatient Hospital Stay: Payer: BC Managed Care – PPO | Admitting: Hematology

## 2023-10-13 VITALS — BP 132/88 | HR 78 | Temp 98.2°F | Resp 20 | Wt 251.1 lb

## 2023-10-13 DIAGNOSIS — C187 Malignant neoplasm of sigmoid colon: Secondary | ICD-10-CM

## 2023-10-13 DIAGNOSIS — Z9221 Personal history of antineoplastic chemotherapy: Secondary | ICD-10-CM | POA: Diagnosis not present

## 2023-10-13 DIAGNOSIS — K760 Fatty (change of) liver, not elsewhere classified: Secondary | ICD-10-CM | POA: Diagnosis not present

## 2023-10-13 DIAGNOSIS — Z800 Family history of malignant neoplasm of digestive organs: Secondary | ICD-10-CM | POA: Diagnosis not present

## 2023-10-13 DIAGNOSIS — Z8052 Family history of malignant neoplasm of bladder: Secondary | ICD-10-CM | POA: Diagnosis not present

## 2023-10-13 NOTE — Progress Notes (Signed)
 [facility]   [phone] [phone]   Clinic Follow up Note   Patient Care Team: [name], MD as PCP - General (Family Medicine) [name], DO as PCP - Cardiology (Cardiology) [name], MD as Consulting Physician (Oncology)  Date of Service:  10/13/2023  CHIEF COMPLAINT: f/u of colon cancer  CURRENT THERAPY:  Cancer surveillance  Oncology History   Adenocarcinoma of sigmoid colon (HCC) G1, stage IIIB p(T3, N1a) cM0, stage IIIB, MSS -initially developed abdominal pain/cramping in 02/2021 and rectal bleeding since 05/2021. Colonoscopy 09/11/21 showed 6 cm sigmoid colon mass located 20-26 cm from anal verge. Biopsy confirmed adenocarcinoma. -staging CT 4/25 and MRI 4/27 showed suspicious mesenteric lymph nodes, no distant metastasis. -s/p surgical resection 10/17/21 with Dr. Camilo Cella, path showed clear margins and one positive node -baseline CEA 11/13/21 was WNL. -given his stage IIIB disease, he completed 4 cycles adjuvant CAPEOX 11/19/21 - 02/03/22. He tolerated relatively well overall. -post-treatment CT CAP 03/22/22 showed NED. -he is clinically doing very well with minimal remaining side effects.  -his surveillance CT on 10/06/2023 showed surgical change, no evidence of cancer recurrence.     Assessment & Plan Colon cancer Diagnosed in 2023. Recent CT scan shows no recurrence.  I personally reviewed his CT scan images with radiologist, there are some subtle changes in his liver which we believe are related to fatty liver.  No suspicion for cancer recurrence. -Tumor markers are normal. Over two years post-diagnosis, reducing need for intensive monitoring. Discussed new blood test for microscopic tumor DNA, deemed less important given time since diagnosis. Plan to continue surveillance with CT scans and follow-up visits. - Schedule CT scan in one year - Continue follow-up visits every four months with blood tests - Perform physical exam  at next visit unless CT scan is scheduled - Monitor for gastrointestinal symptoms, bloating, pain, changes in bowel habits, or unintentional weight loss  Fatty liver Identified on recent CT scan. No immediate concerns or symptoms reported. - Monitor liver function tests as part of routine blood work  Elevated bilirubin Mildly elevated bilirubin, consistent with previous results. No new symptoms or concerns. - Continue monitoring bilirubin levels as part of routine blood work  Plan - Lab and CT scan reviewed, NED. I spoke with radiologist regarding his CT scan images. - Follow-up in 4 months with lab   SUMMARY OF ONCOLOGIC HISTORY: Oncology History Overview Note   Cancer Staging  Adenocarcinoma of sigmoid colon [facility]) Staging form: Colon and Rectum, AJCC 8th Edition - Pathologic stage from 10/17/2021: Stage IIIB (pT3, pN1a, cM0) - Signed by Sonja [place], MD on 11/12/2021    Adenocarcinoma of sigmoid colon (HCC)  09/11/2021 Procedure   Colonoscopy, Dr. Karene Oto  Impression - One 8 mm polyp in the ascending colon, removed with a cold snare. Resected and retrieved. - One 4 mm polyp in the sigmoid colon, removed with a cold snare. Resected and retrieved. - Malignant partially obstructing tumor in the sigmoid colon, located 20-26 cm from the anal verge. This was traversed. Biopsied. Tattoo placed 1-2 cm distal to the lesion. - One 2 mm polyp in the rectum, removed with a cold biopsy forceps. Resected and retrieved. - The distal rectum and anal verge are normal on retroflexion view. - The examined portion of the ileum was normal.   09/11/2021 Initial Biopsy   Diagnosis 1. Duodenum, Biopsy - DUODENAL MUCOSA WITH NORMAL VILLOUS ARCHITECTURE. - NO VILLOUS ATROPHY OR INCREASED INTRAEPITHELIAL LYMPHOCYTES. 2. Duodenum, Biopsy, bulb -  DUODENAL BULB WITH PEPTIC INJURY. 3. Ascending Colon Polyp - MULTIPLE FRAGMENTS OF TUBULAR ADENOMA WITHOUT HIGH GRADE DYSPLASIA. 4. Sigmoid Colon Polyp -  INVASIVE COLONIC ADENOCARCINOMA. - SEE MICROSCOPIC DESCRIPTION. 5. Colon, polyp(s), mass - INVASIVE COLONIC ADENOCARCINOMA. - SEE MICROSCOPIC DESCRIPTION. 6. Rectum, polyp(s) - HYPERPLASTIC POLYP. Microscopic Comment 4. and 5. The carcinoma in parts 4 and 5 have identical morphologic features.   09/18/2021 Imaging   EXAM: CT CHEST, ABDOMEN, AND PELVIS WITH CONTRAST  IMPRESSION: 1. Short segment asymmetric wall thickening of the sigmoid colon is consistent with patient's known sigmoid colon cancer. 2. Prominent lymph nodes in the sigmoid mesentery measure up to 6 mm in short axis, which likely reflect local nodal disease involvement. 3. No evidence of distant metastatic disease in the chest, abdomen or pelvis. 4. No evidence of high-grade bowel obstruction. However, there is a large volume of formed stool throughout the colon   09/20/2021 Imaging   EXAM: MRI PELVIS WITHOUT AND WITH CONTRAST  IMPRESSION: 1. Partially obstructing malignancy of the mid sigmoid colon with prominent sigmoid mesenteric lymph nodes suspicious for early nodal metastases. 2. No evidence bowel obstruction, perforation, ascites or peritoneal nodularity. 3. Left paracentral disc protrusion at L5-S1 with resulting left S1 nerve root encroachment.   10/02/2021 Initial Diagnosis   Adenocarcinoma of sigmoid colon (HCC)   10/09/2021 Genetic Testing   [facility] CancerNext-Expanded Panel was Negative. Report date is 10/26/2021.  The CancerNext-Expanded gene panel offered by [facility] and includes sequencing, rearrangement, and RNA analysis for the following 77 genes: AIP, ALK, APC, ATM, AXIN2, BAP1, BARD1, BLM, BMPR1A, BRCA1, BRCA2, BRIP1, CDC73, CDH1, CDK4, CDKN1B, CDKN2A, CHEK2, CTNNA1, DICER1, FANCC, FH, FLCN, GALNT12, KIF1B, LZTR1, MAX, MEN1, MET, MLH1, MSH2, MSH3, MSH6, MUTYH, NBN, NF1, NF2, NTHL1, PALB2, PHOX2B, PMS2, POT1, PRKAR1A, PTCH1, PTEN, RAD51C, RAD51D, RB1, RECQL, RET, SDHA, SDHAF2, SDHB, SDHC, SDHD,  SMAD4, SMARCA4, SMARCB1, SMARCE1, STK11, SUFU, TMEM127, TP53, TSC1, TSC2, VHL and XRCC2 (sequencing and deletion/duplication); EGFR, EGLN1, HOXB13, KIT, MITF, PDGFRA, POLD1, and POLE (sequencing only); EPCAM and GREM1 (deletion/duplication only).    10/17/2021 Definitive Surgery   FINAL MICROSCOPIC DIAGNOSIS:   A. COLON, RECTOSIGMOID, RESECTION:  -  Invasive well differentiated adenocarcinoma (6 cm in greatest  dimension) with extension through submucosa, muscularis propria and mesenteric/subserosal adipose tissue with abutment but not definitive involvement of the serosal membrane.  -  Focal evidence of precursor lesion (i.e. tubular adenoma)  -  Margins negative (proximal 5 cm, distal 7 cm and mesenteric 6 cm)  -  Negative for lymphovascular and perineural invasion.  -  Focal high-grade tumor budding present.  -  1 of 22 lymph nodes positive for malignancy  pT3 pN1 pM n/a   B. FINAL DISTAL MARGIN:  -   Unremarkable colonic mucosa, negative for malignancy.    ADDENDUM:  Mismatch Repair Protein (IHC)  SUMMARY INTERPRETATION: NORMAL   10/17/2021 Cancer Staging   Staging form: Colon and Rectum, AJCC 8th Edition - Pathologic stage from 10/17/2021: Stage IIIB (pT3, pN1a, cM0) - Signed by Sonja [place], MD on 11/12/2021 Total positive nodes: 1 Histologic grading system: 4 grade system Histologic grade (G): G2 Residual tumor (R): R0 - None   11/19/2021 -  Chemotherapy   Patient is on Treatment Plan : COLORECTAL Xelox (Capeox) q21d     10/03/2022 Imaging   CT CHEST ABDOMEN PELVIS W CONTRAST   IMPRESSION: 1. Prior partial sigmoidectomy with rectosigmoid anastomotic sutures in the midline pelvis. No evidence of local recurrence. 2. No evidence of metastatic disease  within the chest, abdomen, or pelvis.      Discussed the use of AI scribe software for clinical note transcription with the patient, who gave verbal consent to proceed.  History of Present Illness Philip Clark is a 38 year  old male with colon cancer who presents for follow-up.  He has no pain or discomfort and normal bowel movements without blood, constipation, or diarrhea. He experiences no gastrointestinal symptoms such as bloating, pain, or changes in bowel habits, and there is no unintentional weight loss.  A recent CT scan and blood tests, including blood count and kidney and liver function, were normal except for a mildly elevated bilirubin, consistent with previous results. Tumor marker levels are within normal range.     All other systems were reviewed with the patient and are negative.  MEDICAL HISTORY:  Past Medical History:  Diagnosis Date   Allergy    Anemia    Anxiety    Bronchitis    Cancer (HCC)    Depression     SURGICAL HISTORY: Past Surgical History:  Procedure Laterality Date   ANTERIOR CRUCIATE LIGAMENT (ACL) REVISION Left    FLEXIBLE SIGMOIDOSCOPY N/A 10/17/2021   Procedure: FLEXIBLE SIGMOIDOSCOPY, INTRAOPERATIVE ASSESSMENT OF PERFUSION;  Surgeon: Melvenia Stabs, MD;  Location: [facility];  Service: General;  Laterality: N/A;   XI ROBOTIC ASSISTED LOWER ANTERIOR RESECTION N/A 10/17/2021   Procedure: XI ROBOTIC ASSISTED LOWER ANTERIOR RESECTION;  Surgeon: Melvenia Stabs, MD;  Location: [facility];  Service: General;  Laterality: N/A;  GEN AND TAP BLOCK    I have reviewed the social history and family history with the patient and they are unchanged from previous note.  ALLERGIES:  is allergic to multihance [gadobenate].  MEDICATIONS:  Current Outpatient Medications  Medication Sig Dispense Refill   Acetaminophen  (TYLENOL  PO) Take 800 mg by mouth.     amitriptyline  (ELAVIL ) 10 MG tablet Take 1-3 tablets (10-30 mg total) by mouth at bedtime. (Patient taking differently: Take 10-30 mg by mouth as needed.) 30 tablet 3   hydrOXYzine  (VISTARIL ) 25 MG capsule Take 1 capsule (25 mg total) by mouth every 8 (eight) hours as needed. (Patient taking differently: Take 25 mg by mouth as  needed.) 30 capsule 0   ibuprofen  (ADVIL ) 800 MG tablet Take 800 mg by mouth every 8 (eight) hours as needed.     ondansetron  (ZOFRAN ) 8 MG tablet Take 1 tablet (8 mg total) by mouth 2 (two) times daily as needed for refractory nausea / vomiting. Start on day 3 after chemotherapy. (Patient not taking: Reported on 01/14/2023) 30 tablet 1   predniSONE  (STERAPRED UNI-PAK 48 TAB) 10 MG (48) TBPK tablet Please instruct to 12-day Dosepak (Patient not taking: Reported on 05/05/2023) 48 tablet 0   No current facility-administered medications for this visit.    PHYSICAL EXAMINATION: ECOG PERFORMANCE STATUS: 0 - Asymptomatic  Vitals:   10/13/23 0914  BP: 132/88  Pulse: 78  Resp: 20  Temp: 98.2 F (36.8 C)  SpO2: 100%   Wt Readings from Last 3 Encounters:  10/13/23 251 lb 1.6 oz (113.9 kg)  06/09/23 255 lb 9.6 oz (115.9 kg)  06/05/23 266 lb (120.7 kg)     GENERAL:alert, no distress and comfortable SKIN: skin color, texture, turgor are normal, no rashes or significant lesions EYES: normal, Conjunctiva are pink and non-injected, sclera clear Musculoskeletal:no cyanosis of digits and no clubbing  NEURO: alert & oriented x 3 with fluent speech, no focal motor/sensory deficits  Physical Exam  LABORATORY DATA:  I have reviewed the data as listed    Latest Ref Rng & Units 10/06/2023    8:56 AM 06/09/2023    9:09 AM 05/05/2023   10:32 AM  CBC  WBC 4.0 - 10.5 K/uL 8.7  7.4  7.5   Hemoglobin 13.0 - 17.0 g/dL 16.1  09.6  04.5   Hematocrit 39.0 - 52.0 % 42.4  44.4  43.2   Platelets 150 - 400 K/uL 253  269  278         Latest Ref Rng & Units 10/06/2023    8:56 AM 06/09/2023    9:09 AM 05/05/2023   10:32 AM  CMP  Glucose 70 - 99 mg/dL 93  97  409   BUN 6 - 20 mg/dL 15  15  16    Creatinine 0.61 - 1.24 mg/dL 8.11  9.14  7.82   Sodium 135 - 145 mmol/L 136  137  139   Potassium 3.5 - 5.1 mmol/L 4.0  3.9  4.5   Chloride 98 - 111 mmol/L 102  105  102   CO2 22 - 32 mmol/L 29  25  23     Calcium 8.9 - 10.3 mg/dL 9.2  9.6  9.9   Total Protein 6.5 - 8.1 g/dL 8.1  8.2  7.9   Total Bilirubin 0.0 - 1.2 mg/dL 1.3  1.5  1.0   Alkaline Phos 38 - 126 U/L 62  61  71   AST 15 - 41 U/L 23  28  30    ALT 0 - 44 U/L 25  32  36       RADIOGRAPHIC STUDIES: I have personally reviewed the radiological images as listed and agreed with the findings in the report. No results found.    No orders of the defined types were placed in this encounter.  All questions were answered. The patient knows to call the clinic with any problems, questions or concerns. No barriers to learning was detected. The total time spent in the appointment was 25 minutes, including review of chart and various tests results, discussions about plan of care and coordination of care plan     Sonja [place], MD 10/13/2023

## 2023-10-14 ENCOUNTER — Other Ambulatory Visit: Payer: Self-pay

## 2023-10-31 DIAGNOSIS — G4733 Obstructive sleep apnea (adult) (pediatric): Secondary | ICD-10-CM | POA: Diagnosis not present

## 2023-11-19 ENCOUNTER — Other Ambulatory Visit: Payer: Self-pay

## 2023-11-30 DIAGNOSIS — G4733 Obstructive sleep apnea (adult) (pediatric): Secondary | ICD-10-CM | POA: Diagnosis not present

## 2023-12-08 DIAGNOSIS — G4733 Obstructive sleep apnea (adult) (pediatric): Secondary | ICD-10-CM | POA: Diagnosis not present

## 2023-12-08 DIAGNOSIS — F341 Dysthymic disorder: Secondary | ICD-10-CM | POA: Diagnosis not present

## 2023-12-31 DIAGNOSIS — G4733 Obstructive sleep apnea (adult) (pediatric): Secondary | ICD-10-CM | POA: Diagnosis not present

## 2024-01-16 ENCOUNTER — Ambulatory Visit: Admission: EM | Admit: 2024-01-16 | Discharge: 2024-01-16 | Disposition: A | Discharge status: Home / Self Care

## 2024-01-16 ENCOUNTER — Other Ambulatory Visit: Payer: Self-pay

## 2024-01-16 DIAGNOSIS — H6992 Unspecified Eustachian tube disorder, left ear: Secondary | ICD-10-CM | POA: Diagnosis not present

## 2024-01-16 NOTE — ED Provider Notes (Signed)
 [facility]    [name]: [id number] Arrival date & time: 01/16/24  0830      History   Chief Complaint Chief Complaint  Patient presents with   Otalgia    HPI Philip Clark is a 38 y.o. male.   HPI Pt presents today with concerns for left ear pain, crackling noises, decreased hearing and pressure that has been ongoing for about a month and seems to have gotten worse over the last 1-2 weeks He feels like there has been some drainage from the ear but it has not been present on a napkin or anything he has used in an attempt to wipe it away  He denies sore throat or drainage around the ear but states he feels like there is some postnasal drainage    Past Medical History:  Diagnosis Date   Allergy    Anemia    Anxiety    Bronchitis    Cancer (HCC)    Depression     Patient Active Problem List   Diagnosis Date Noted   Sleep related headaches 06/05/2023   Seasonal allergic rhinitis due to pollen 06/05/2023   Loud snoring 06/05/2023   Non-restorative sleep 06/05/2023   Class 1 obesity due to excess calories with body mass index (BMI) of 34.0 to 34.9 in adult 06/05/2023   Shifting sleep-work schedule 06/05/2023   Nonintractable headache 01/14/2023   Genetic testing 10/29/2021   S/P laparoscopic-assisted sigmoidectomy 10/17/2021   Adenocarcinoma of sigmoid colon (HCC) 10/02/2021   IDA (iron deficiency anemia) 08/13/2021   Heme positive stool 07/02/2021   Hematochezia 07/02/2021   Lower abdominal pain 07/02/2021   Anemia 07/02/2021   Elevated pulse rate 04/17/2021   Viral syndrome 04/17/2021   Rib pain 10/19/2020   Lumbar radiculopathy 10/19/2020   Rib cage dysfunction 10/18/2020   Cauda equina injury without bone injury (HCC) 10/18/2020   Bronchitis    Anxiety    Allergy    S/P ACL reconstruction 07/16/2016   Injury of left knee 05/28/2016   Knee LCL sprain 11/24/2014    Past Surgical History:  Procedure Laterality Date   ANTERIOR CRUCIATE LIGAMENT  (ACL) REVISION Left    FLEXIBLE SIGMOIDOSCOPY N/A 10/17/2021   Procedure: FLEXIBLE SIGMOIDOSCOPY, INTRAOPERATIVE ASSESSMENT OF PERFUSION;  Surgeon: [name] Lonni HERO, MD;  Location: [facility];  Service: General;  Laterality: N/A;   XI ROBOTIC ASSISTED LOWER ANTERIOR RESECTION N/A 10/17/2021   Procedure: XI ROBOTIC ASSISTED LOWER ANTERIOR RESECTION;  Surgeon: [name] Lonni HERO, MD;  Location: [facility];  Service: General;  Laterality: N/A;  GEN AND TAP BLOCK       Home Medications    Prior to Admission medications   Medication Sig Start Date End Date Taking? Authorizing Provider  Acetaminophen  (TYLENOL  PO) Take 800 mg by mouth.    [provider]  amitriptyline  (ELAVIL ) 10 MG tablet Take 1-3 tablets (10-30 mg total) by mouth at bedtime. Patient taking differently: Take 10-30 mg by mouth as needed. 05/05/23   Ines Onetha NOVAK, MD  hydrOXYzine  (VISTARIL ) 25 MG capsule Take 1 capsule (25 mg total) by mouth every 8 (eight) hours as needed. Patient taking differently: Take 25 mg by mouth as needed. 09/13/22   Thedora Garnette HERO, MD  ibuprofen  (ADVIL ) 800 MG tablet Take 800 mg by mouth every 8 (eight) hours as needed.    [provider]  ondansetron  (ZOFRAN ) 8 MG tablet Take 1 tablet (8 mg total) by mouth 2 (two) times daily as needed for refractory nausea /  vomiting. Start on day 3 after chemotherapy. Patient not taking: Reported on 01/14/2023 11/12/21   [name], MD  predniSONE  (STERAPRED UNI-PAK 48 TAB) 10 MG (48) TBPK tablet Please instruct to 12-day Dosepak Patient not taking: Reported on 05/05/2023 01/14/23   [name], MD    Family History Family History  Problem Relation Age of Onset   Anxiety disorder Mother    Heart disease Father 33       MI x 2   Skin cancer Father        basal or squamous cell   Dementia Maternal Grandmother    Heart disease Maternal Grandfather 59       CAD with CABG   Bladder Cancer Maternal Grandfather        dx. 60s   Colon cancer  Paternal Grandmother        dx. 60s/70s   Early death Paternal Grandfather 64       CAD/MI   Rectal cancer Neg Hx    Stomach cancer Neg Hx    Esophageal cancer Neg Hx    Colon polyps Neg Hx    Sleep apnea Neg Hx     Social History Social History   Tobacco Use   Smoking status: Never   Smokeless tobacco: Never  Vaping Use   Vaping status: Never Used  Substance Use Topics   Alcohol use: Yes    Alcohol/week: 4.0 standard drinks of alcohol    Types: 3 Cans of beer, 1 Shots of liquor per week    Comment: occ   Drug use: No     Allergies   Multihance [gadobenate]   Review of Systems Review of Systems  Constitutional:  Negative for chills and fever.  HENT:  Positive for ear discharge, ear pain and hearing loss. Negative for sore throat.      Physical Exam Triage Vital Signs ED Triage Vitals  Encounter Vitals Group     BP 01/16/24 0840 125/83     Girls Systolic BP Percentile --      Girls Diastolic BP Percentile --      Boys Systolic BP Percentile --      Boys Diastolic BP Percentile --      Pulse Rate 01/16/24 0840 84     Resp 01/16/24 0840 16     Temp 01/16/24 0840 98.3 F (36.8 C)     Temp Source 01/16/24 0840 Oral     SpO2 01/16/24 0840 95 %     Weight 01/16/24 0840 245 lb (111.1 kg)     Height 01/16/24 0840 6' 2 (1.88 m)     Head Circumference --      Peak Flow --      Pain Score 01/16/24 0854 1     Pain Loc --      Pain Education --      Exclude from Growth Chart --    No data found.  Updated Vital Signs BP 125/83 (BP Location: Right Arm)   Pulse 84   Temp 98.3 F (36.8 C) (Oral)   Resp 16   Ht 6' 2 (1.88 m)   Wt 245 lb (111.1 kg)   SpO2 95%   BMI 31.46 kg/m   Visual Acuity Right Eye Distance:   Left Eye Distance:   Bilateral Distance:    Right Eye Near:   Left Eye Near:    Bilateral Near:     Physical Exam Vitals reviewed.  Constitutional:      General: He  is awake. He is not in acute distress.    Appearance: Normal  appearance. He is well-developed and well-groomed. He is not ill-appearing or toxic-appearing.  HENT:     Head: Normocephalic and atraumatic.     Right Ear: Hearing, tympanic membrane and ear canal normal.     Left Ear: Hearing and ear canal normal.  No middle ear effusion. Tympanic membrane is retracted. Tympanic membrane is not injected, scarred, perforated, erythematous or bulging.     Mouth/Throat:     Lips: Pink.     Mouth: Mucous membranes are moist.  Eyes:     Extraocular Movements: Extraocular movements intact.     Conjunctiva/sclera: Conjunctivae normal.  Pulmonary:     Effort: Pulmonary effort is normal.  Musculoskeletal:     Cervical back: Normal range of motion.  Neurological:     Mental Status: He is alert and oriented to person, place, and time.  Psychiatric:        Attention and Perception: Attention normal.        Mood and Affect: Mood normal.        Speech: Speech normal.        Behavior: Behavior normal. Behavior is cooperative.      UC Treatments / Results  Labs (all labs ordered are listed, but only abnormal results are displayed) Labs Reviewed - No data to display  EKG   Radiology No results found.  Procedures Procedures (including critical care time)  Medications Ordered in UC Medications - No data to display  Initial Impression / Assessment and Plan / UC Course  I have reviewed the triage vital signs and the nursing notes.  Pertinent labs & imaging results that were available during my care of the patient were reviewed by me and considered in my medical decision making (see chart for details).      Final Clinical Impressions(s) / UC Diagnoses   Final diagnoses:  Eustachian tube dysfunction, left   Patient presents today with concerns for mild ear pain on the left along with slightly decreased hearing and ear fullness.  Physical exam is notable for retracted tympanic membrane without evidence of effusion or purulence.  No evidence of  otitis externa or impacted cerumen.  At this time I suspect eustachian tube dysfunction on the left side.  Recommend second-generation antihistamine as well as Flonase nasal spray to assist with symptoms.  Reviewed recommendations with patient as well as expected timeline for resolution.  Patient voices agreement and understanding with recommendations.  Follow-up as needed if symptoms are progressing or persisting    Discharge Instructions      You were seen today for concerns of left-sided ear fullness and mild pain.  At this time I suspect you have some likely eustachian tube dysfunction.  This occurs when the inner ear is unable to drain properly which can cause some of the symptoms that you are having.  To assist with this I recommend starting a second-generation antihistamine such as Claritin, Allegra, or Zyrtec per your preference.  The generics of these typically work just as well as brands.  Please choose 1 and take daily as well as Flonase nasal spray to assist with your symptoms. It can take a few weeks for you to have full therapeutic relief so please remain consistent with the medication regimen described above     ED Prescriptions   None    PDMP not reviewed this encounter.   Dayne Dekay, Rocky BRAVO, PA-C 01/16/24 1024

## 2024-01-16 NOTE — Discharge Instructions (Addendum)
 You were seen today for concerns of left-sided ear fullness and mild pain.  At this time I suspect you have some likely eustachian tube dysfunction.  This occurs when the inner ear is unable to drain properly which can cause some of the symptoms that you are having.  To assist with this I recommend starting a second-generation antihistamine such as Claritin, Allegra, or Zyrtec per your preference.  The generics of these typically work just as well as brands.  Please choose 1 and take daily as well as Flonase nasal spray to assist with your symptoms. It can take a few weeks for you to have full therapeutic relief so please remain consistent with the medication regimen described above

## 2024-01-16 NOTE — ED Triage Notes (Signed)
 Pt presents with a chief complaint of left ear pain/pressure x 1 month. Pt states his symptoms has become more noticeable over the last two weeks. Currently rates his ear pain a 1/10. Denies taking or applying OTC medications PTA. Has intermittent crackles in left ear with decreased hearing.

## 2024-01-31 DIAGNOSIS — G4733 Obstructive sleep apnea (adult) (pediatric): Secondary | ICD-10-CM | POA: Diagnosis not present

## 2024-02-10 DIAGNOSIS — F341 Dysthymic disorder: Secondary | ICD-10-CM | POA: Diagnosis not present

## 2024-02-10 DIAGNOSIS — G4733 Obstructive sleep apnea (adult) (pediatric): Secondary | ICD-10-CM | POA: Diagnosis not present

## 2024-02-16 ENCOUNTER — Telehealth: Payer: Self-pay | Admitting: Pain Medicine

## 2024-02-16 NOTE — Telephone Encounter (Signed)
 Called pt and he is aware of his appts changes

## 2024-02-17 ENCOUNTER — Other Ambulatory Visit: Payer: Self-pay

## 2024-02-19 ENCOUNTER — Other Ambulatory Visit

## 2024-02-19 ENCOUNTER — Ambulatory Visit: Admitting: Hematology

## 2024-03-05 NOTE — Assessment & Plan Note (Signed)
 G1, stage IIIB p(T3, N1a) cM0, stage IIIB, MSS -initially developed abdominal pain/cramping in 02/2021 and rectal bleeding since 05/2021. Colonoscopy 09/11/21 showed 6 cm sigmoid colon mass located 20-26 cm from anal verge. Biopsy confirmed adenocarcinoma. -staging CT 4/25 and MRI 4/27 showed suspicious mesenteric lymph nodes, no distant metastasis. -s/p surgical resection 10/17/21 with Dr. Camilo Cella, path showed clear margins and one positive node -baseline CEA 11/13/21 was WNL. -given his stage IIIB disease, he completed 4 cycles adjuvant CAPEOX 11/19/21 - 02/03/22. He tolerated relatively well overall. -post-treatment CT CAP 03/22/22 showed NED. -he is clinically doing very well with minimal remaining side effects.  -his surveillance CT on 10/06/2023 showed surgical change, no evidence of cancer recurrence.

## 2024-03-08 ENCOUNTER — Ambulatory Visit: Admitting: Hematology

## 2024-03-08 ENCOUNTER — Inpatient Hospital Stay: Attending: Hematology

## 2024-03-08 VITALS — BP 130/88 | HR 82 | Temp 97.8°F | Resp 17 | Wt 258.0 lb

## 2024-03-08 DIAGNOSIS — C187 Malignant neoplasm of sigmoid colon: Secondary | ICD-10-CM | POA: Diagnosis not present

## 2024-03-08 DIAGNOSIS — Z9221 Personal history of antineoplastic chemotherapy: Secondary | ICD-10-CM | POA: Insufficient documentation

## 2024-03-08 DIAGNOSIS — Z8052 Family history of malignant neoplasm of bladder: Secondary | ICD-10-CM | POA: Diagnosis not present

## 2024-03-08 DIAGNOSIS — Z800 Family history of malignant neoplasm of digestive organs: Secondary | ICD-10-CM | POA: Insufficient documentation

## 2024-03-08 DIAGNOSIS — K760 Fatty (change of) liver, not elsewhere classified: Secondary | ICD-10-CM | POA: Insufficient documentation

## 2024-03-08 DIAGNOSIS — E663 Overweight: Secondary | ICD-10-CM | POA: Diagnosis not present

## 2024-03-08 LAB — CBC WITH DIFFERENTIAL/PLATELET
Abs Immature Granulocytes: 0.02 K/uL (ref 0.00–0.07)
Basophils Absolute: 0.1 K/uL (ref 0.0–0.1)
Basophils Relative: 1 %
Eosinophils Absolute: 0.4 K/uL (ref 0.0–0.5)
Eosinophils Relative: 5 %
HCT: 41.9 % (ref 39.0–52.0)
Hemoglobin: 14.3 g/dL (ref 13.0–17.0)
Immature Granulocytes: 0 %
Lymphocytes Relative: 29 %
Lymphs Abs: 2.2 K/uL (ref 0.7–4.0)
MCH: 30.2 pg (ref 26.0–34.0)
MCHC: 34.1 g/dL (ref 30.0–36.0)
MCV: 88.6 fL (ref 80.0–100.0)
Monocytes Absolute: 0.8 K/uL (ref 0.1–1.0)
Monocytes Relative: 11 %
Neutro Abs: 4.1 K/uL (ref 1.7–7.7)
Neutrophils Relative %: 54 %
Platelets: 263 K/uL (ref 150–400)
RBC: 4.73 MIL/uL (ref 4.22–5.81)
RDW: 12.7 % (ref 11.5–15.5)
WBC: 7.6 K/uL (ref 4.0–10.5)
nRBC: 0 % (ref 0.0–0.2)

## 2024-03-08 LAB — COMPREHENSIVE METABOLIC PANEL WITH GFR
ALT: 27 U/L (ref 0–44)
AST: 28 U/L (ref 15–41)
Albumin: 4.4 g/dL (ref 3.5–5.0)
Alkaline Phosphatase: 53 U/L (ref 38–126)
Anion gap: 5 (ref 5–15)
BUN: 17 mg/dL (ref 6–20)
CO2: 27 mmol/L (ref 22–32)
Calcium: 9.3 mg/dL (ref 8.9–10.3)
Chloride: 106 mmol/L (ref 98–111)
Creatinine, Ser: 1.19 mg/dL (ref 0.61–1.24)
GFR, Estimated: >60 mL/min (ref >60)
Glucose, Bld: 99 mg/dL (ref 70–99)
Potassium: 4 mmol/L (ref 3.5–5.1)
Sodium: 138 mmol/L (ref 135–145)
Total Bilirubin: 1.1 mg/dL (ref 0.0–1.2)
Total Protein: 7.7 g/dL (ref 6.5–8.1)

## 2024-03-08 LAB — CEA (ACCESS): CEA (CHCC): 2.66 ng/mL (ref 0.00–5.00)

## 2024-03-08 NOTE — Progress Notes (Signed)
 [facility]   [phone] Fax:([phone]   Clinic Follow up Note   Patient Care Team: [name], MD as PCP - General (Family Medicine) [name], DO as PCP - Cardiology (Cardiology) [name], MD as Consulting Physician (Oncology)  Date of Service:  03/08/2024  CHIEF COMPLAINT: f/u of colon cancer  CURRENT THERAPY:  Cancer surveillance  Oncology History   Adenocarcinoma of sigmoid colon (HCC) G1, stage IIIB p(T3, N1a) cM0, stage IIIB, MSS -initially developed abdominal pain/cramping in 02/2021 and rectal bleeding since 05/2021. Colonoscopy 09/11/21 showed 6 cm sigmoid colon mass located 20-26 cm from anal verge. Biopsy confirmed adenocarcinoma. -staging CT 4/25 and MRI 4/27 showed suspicious mesenteric lymph nodes, no distant metastasis. -s/p surgical resection 10/17/21 with Dr. [name], path showed clear margins and one positive node -baseline CEA 11/13/21 was WNL. -given his stage IIIB disease, he completed 4 cycles adjuvant CAPEOX 11/19/21 - 02/03/22. He tolerated relatively well overall. -post-treatment CT CAP 03/22/22 showed NED. -he is clinically doing very well with minimal remaining side effects.  -his surveillance CT on 10/06/2023 showed surgical change, no evidence of cancer recurrence.    Assessment & Plan Stage IIIB sigmoid colon cancer in surveillance Stage IIIB sigmoid colon cancer, diagnosed in 2023, currently asymptomatic with normal blood counts. Last colonoscopy in April 2023 and scan in May 2023. Moderate risk. - Order CT scan next year at the three-year mark if clinically indicated - Continue routine surveillance with colonoscopy every three years  Allergy to MRI contrast agent Allergy to MRI contrast agent with previous reaction of hives and respiratory distress. No allergy to CT contrast. - Avoid MRI with contrast unless premedicated with steroids - Proceed with CT scans as needed without additional  precautions  Overweight BMI indicates overweight status. Active but reports insufficient energy for regular exercise. - Encourage dietary modifications to reduce fatty foods and carbohydrates - Advise regular physical activity as tolerated  General Health Maintenance Advised to follow up with primary care physician for routine health maintenance, including monitoring cholesterol levels due to history of fatty liver. - Advise seeing primary care physician for routine check-up and monitoring of cholesterol levels  Plan - He is clinically doing very well, exam was unremarkable, lab reviewed.  No clinical concern for recurrence - Continue cancer surveillance.  He will see his PCP in 3 months.  Follow-up with me in 7 months with labs and surveillance CT scan 1 week before.   SUMMARY OF ONCOLOGIC HISTORY: Oncology History Overview Note   Cancer Staging  Adenocarcinoma of sigmoid colon [facility]) Staging form: Colon and Rectum, AJCC 8th Edition - Pathologic stage from 10/17/2021: Stage IIIB (pT3, pN1a, cM0) - Signed by [name], MD on 11/12/2021    Adenocarcinoma of sigmoid colon (HCC)  09/11/2021 Procedure   Colonoscopy, Dr. San  Impression - One 8 mm polyp in the ascending colon, removed with a cold snare. Resected and retrieved. - One 4 mm polyp in the sigmoid colon, removed with a cold snare. Resected and retrieved. - Malignant partially obstructing tumor in the sigmoid colon, located 20-26 cm from the anal verge. This was traversed. Biopsied. Tattoo placed 1-2 cm distal to the lesion. - One 2 mm polyp in the rectum, removed with a cold biopsy forceps. Resected and retrieved. - The distal rectum and anal verge are normal on retroflexion view. - The examined portion of the ileum was normal.   09/11/2021 Initial Biopsy   Diagnosis 1. Duodenum, Biopsy - DUODENAL MUCOSA WITH  NORMAL VILLOUS ARCHITECTURE. - NO VILLOUS ATROPHY OR INCREASED INTRAEPITHELIAL LYMPHOCYTES. 2. Duodenum,  Biopsy, bulb - DUODENAL BULB WITH PEPTIC INJURY. 3. Ascending Colon Polyp - MULTIPLE FRAGMENTS OF TUBULAR ADENOMA WITHOUT HIGH GRADE DYSPLASIA. 4. Sigmoid Colon Polyp - INVASIVE COLONIC ADENOCARCINOMA. - SEE MICROSCOPIC DESCRIPTION. 5. Colon, polyp(s), mass - INVASIVE COLONIC ADENOCARCINOMA. - SEE MICROSCOPIC DESCRIPTION. 6. Rectum, polyp(s) - HYPERPLASTIC POLYP. Microscopic Comment 4. and 5. The carcinoma in parts 4 and 5 have identical morphologic features.   09/18/2021 Imaging   EXAM: CT CHEST, ABDOMEN, AND PELVIS WITH CONTRAST  IMPRESSION: 1. Short segment asymmetric wall thickening of the sigmoid colon is consistent with patient's known sigmoid colon cancer. 2. Prominent lymph nodes in the sigmoid mesentery measure up to 6 mm in short axis, which likely reflect local nodal disease involvement. 3. No evidence of distant metastatic disease in the chest, abdomen or pelvis. 4. No evidence of high-grade bowel obstruction. However, there is a large volume of formed stool throughout the colon   09/20/2021 Imaging   EXAM: MRI PELVIS WITHOUT AND WITH CONTRAST  IMPRESSION: 1. Partially obstructing malignancy of the mid sigmoid colon with prominent sigmoid mesenteric lymph nodes suspicious for early nodal metastases. 2. No evidence bowel obstruction, perforation, ascites or peritoneal nodularity. 3. Left paracentral disc protrusion at L5-S1 with resulting left S1 nerve root encroachment.   10/02/2021 Initial Diagnosis   Adenocarcinoma of sigmoid colon (HCC)   10/09/2021 Genetic Testing   [facility] CancerNext-Expanded Panel was Negative. Report date is 10/26/2021.  The CancerNext-Expanded gene panel offered by [facility] and includes sequencing, rearrangement, and RNA analysis for the following 77 genes: AIP, ALK, APC, ATM, AXIN2, BAP1, BARD1, BLM, BMPR1A, BRCA1, BRCA2, BRIP1, CDC73, CDH1, CDK4, CDKN1B, CDKN2A, CHEK2, CTNNA1, DICER1, FANCC, FH, FLCN, GALNT12, KIF1B, LZTR1, MAX, MEN1,  MET, MLH1, MSH2, MSH3, MSH6, MUTYH, NBN, NF1, NF2, NTHL1, PALB2, PHOX2B, PMS2, POT1, PRKAR1A, PTCH1, PTEN, RAD51C, RAD51D, RB1, RECQL, RET, SDHA, SDHAF2, SDHB, SDHC, SDHD, SMAD4, SMARCA4, SMARCB1, SMARCE1, STK11, SUFU, TMEM127, TP53, TSC1, TSC2, VHL and XRCC2 (sequencing and deletion/duplication); EGFR, EGLN1, HOXB13, KIT, MITF, PDGFRA, POLD1, and POLE (sequencing only); EPCAM and GREM1 (deletion/duplication only).    10/17/2021 Definitive Surgery   FINAL MICROSCOPIC DIAGNOSIS:   A. COLON, RECTOSIGMOID, RESECTION:  -  Invasive well differentiated adenocarcinoma (6 cm in greatest  dimension) with extension through submucosa, muscularis propria and mesenteric/subserosal adipose tissue with abutment but not definitive involvement of the serosal membrane.  -  Focal evidence of precursor lesion (i.e. tubular adenoma)  -  Margins negative (proximal 5 cm, distal 7 cm and mesenteric 6 cm)  -  Negative for lymphovascular and perineural invasion.  -  Focal high-grade tumor budding present.  -  1 of 22 lymph nodes positive for malignancy  pT3 pN1 pM n/a   B. FINAL DISTAL MARGIN:  -   Unremarkable colonic mucosa, negative for malignancy.    ADDENDUM:  Mismatch Repair Protein (IHC)  SUMMARY INTERPRETATION: NORMAL   10/17/2021 Cancer Staging   Staging form: Colon and Rectum, AJCC 8th Edition - Pathologic stage from 10/17/2021: Stage IIIB (pT3, pN1a, cM0) - Signed by [name], MD on 11/12/2021 Total positive nodes: 1 Histologic grading system: 4 grade system Histologic grade (G): G2 Residual tumor (R): R0 - None   11/19/2021 -  Chemotherapy   Patient is on Treatment Plan : COLORECTAL Xelox (Capeox) q21d     10/03/2022 Imaging   CT CHEST ABDOMEN PELVIS W CONTRAST   IMPRESSION: 1. Prior partial sigmoidectomy with rectosigmoid anastomotic  sutures in the midline pelvis. No evidence of local recurrence. 2. No evidence of metastatic disease within the chest, abdomen, or pelvis.      Discussed the  use of AI scribe software for clinical note transcription with the patient, who gave verbal consent to proceed.  History of Present Illness Philip Clark is a 38 year old male with colon cancer who presents for follow-up.  He was diagnosed with stage 3B colon cancer in 2023. There have been no new symptoms or changes in his condition over the past three to four months. His last colonoscopy was in April 2023, and his blood counts are normal. He has not started any new medications since his last visit.  His family history includes a brother who was diagnosed with colon cancer at age 98. His brother is aware of the need for colonoscopy screenings.  He has an allergy to MRI contrast, which caused hives and trouble breathing during a procedure in December of last year. He has not had any reactions to CT contrast. He has no new medical concerns.     All other systems were reviewed with the patient and are negative.  MEDICAL HISTORY:  Past Medical History:  Diagnosis Date   Allergy    Anemia    Anxiety    Bronchitis    Cancer (HCC)    Depression     SURGICAL HISTORY: Past Surgical History:  Procedure Laterality Date   ANTERIOR CRUCIATE LIGAMENT (ACL) REVISION Left    FLEXIBLE SIGMOIDOSCOPY N/A 10/17/2021   Procedure: FLEXIBLE SIGMOIDOSCOPY, INTRAOPERATIVE ASSESSMENT OF PERFUSION;  Surgeon: [name] Lonni HERO, MD;  Location: [facility];  Service: General;  Laterality: N/A;   XI ROBOTIC ASSISTED LOWER ANTERIOR RESECTION N/A 10/17/2021   Procedure: XI ROBOTIC ASSISTED LOWER ANTERIOR RESECTION;  Surgeon: [name] Lonni HERO, MD;  Location: [facility];  Service: General;  Laterality: N/A;  GEN AND TAP BLOCK    I have reviewed the social history and family history with the patient and they are unchanged from previous note.  ALLERGIES:  is allergic to multihance [gadobenate].  MEDICATIONS:  Current Outpatient Medications  Medication Sig Dispense Refill   Acetaminophen  (TYLENOL  PO) Take 800 mg  by mouth.     amitriptyline  (ELAVIL ) 10 MG tablet Take 1-3 tablets (10-30 mg total) by mouth at bedtime. 30 tablet 3   hydrOXYzine  (VISTARIL ) 25 MG capsule Take 1 capsule (25 mg total) by mouth every 8 (eight) hours as needed. (Patient taking differently: Take 25 mg by mouth as needed.) 30 capsule 0   ibuprofen  (ADVIL ) 800 MG tablet Take 800 mg by mouth every 8 (eight) hours as needed.     ondansetron  (ZOFRAN ) 8 MG tablet Take 1 tablet (8 mg total) by mouth 2 (two) times daily as needed for refractory nausea / vomiting. Start on day 3 after chemotherapy. 30 tablet 1   predniSONE  (STERAPRED UNI-PAK 48 TAB) 10 MG (48) TBPK tablet Please instruct to 12-day Dosepak (Patient not taking: Reported on 03/08/2024) 48 tablet 0   No current facility-administered medications for this visit.    PHYSICAL EXAMINATION: ECOG PERFORMANCE STATUS: 0 - Asymptomatic  Vitals:   03/08/24 0754  BP: 130/88  Pulse: 82  Resp: 17  Temp: 97.8 F (36.6 C)  SpO2: 99%   Wt Readings from Last 3 Encounters:  03/08/24 258 lb (117 kg)  01/16/24 245 lb (111.1 kg)  10/13/23 251 lb 1.6 oz (113.9 kg)     GENERAL:alert, no distress and comfortable SKIN: skin color, texture,  turgor are normal, no rashes or significant lesions EYES: normal, Conjunctiva are pink and non-injected, sclera clear NECK: supple, thyroid  normal size, non-tender, without nodularity LYMPH:  no palpable lymphadenopathy in the cervical, axillary  LUNGS: clear to auscultation and percussion with normal breathing effort HEART: regular rate & rhythm and no murmurs and no lower extremity edema ABDOMEN:abdomen soft, non-tender and normal bowel sounds Musculoskeletal:no cyanosis of digits and no clubbing  NEURO: alert & oriented x 3 with fluent speech, no focal motor/sensory deficits  Physical Exam ABDOMEN: Abdomen non-tender EXTREMITIES: No ankle tenderness  LABORATORY DATA:  I have reviewed the data as listed    Latest Ref Rng & Units 03/08/2024     7:44 AM 10/06/2023    8:56 AM 06/09/2023    9:09 AM  CBC  WBC 4.0 - 10.5 K/uL 7.6  8.7  7.4   Hemoglobin 13.0 - 17.0 g/dL 85.6  85.2  84.4   Hematocrit 39.0 - 52.0 % 41.9  42.4  44.4   Platelets 150 - 400 K/uL 263  253  269         Latest Ref Rng & Units 03/08/2024    7:44 AM 10/06/2023    8:56 AM 06/09/2023    9:09 AM  CMP  Glucose 70 - 99 mg/dL 99  93  97   BUN 6 - 20 mg/dL 17  15  15    Creatinine 0.61 - 1.24 mg/dL 8.80  8.87  8.88   Sodium 135 - 145 mmol/L 138  136  137   Potassium 3.5 - 5.1 mmol/L 4.0  4.0  3.9   Chloride 98 - 111 mmol/L 106  102  105   CO2 22 - 32 mmol/L 27  29  25    Calcium 8.9 - 10.3 mg/dL 9.3  9.2  9.6   Total Protein 6.5 - 8.1 g/dL 7.7  8.1  8.2   Total Bilirubin 0.0 - 1.2 mg/dL 1.1  1.3  1.5   Alkaline Phos 38 - 126 U/L 53  62  61   AST 15 - 41 U/L 28  23  28    ALT 0 - 44 U/L 27  25  32       RADIOGRAPHIC STUDIES: I have personally reviewed the radiological images as listed and agreed with the findings in the report. No results found.    Orders Placed This Encounter  Procedures   CT CHEST ABDOMEN PELVIS W CONTRAST    Standing Status:   Future    Expected Date:   09/29/2024    Expiration Date:   03/08/2025    If indicated for the ordered procedure, I authorize the administration of contrast media per Radiology protocol:   Yes    Does the patient have a contrast media/X-ray dye allergy?:   No    Preferred imaging location?:   [facility]    Release to patient:   Immediate    If indicated for the ordered procedure, I authorize the administration of oral contrast media per Radiology protocol:   Yes   CBC with Differential/Platelet    Standing Status:   Standing    Number of Occurrences:   50    Expiration Date:   03/07/2025   Comprehensive metabolic panel with GFR    Standing Status:   Standing    Number of Occurrences:   50    Expiration Date:   03/07/2025   CEA (Access)    Standing Status:   Standing  Number of Occurrences:    30    Expiration Date:   03/07/2025   All questions were answered. The patient knows to call the clinic with any problems, questions or concerns. No barriers to learning was detected. The total time spent in the appointment was 20 minutes, including review of chart and various tests results, discussions about plan of care and coordination of care plan     Onita Mattock, MD 03/08/2024

## 2024-03-09 ENCOUNTER — Other Ambulatory Visit: Payer: Self-pay

## 2024-03-17 ENCOUNTER — Other Ambulatory Visit: Payer: Self-pay

## 2024-05-11 DIAGNOSIS — F341 Dysthymic disorder: Secondary | ICD-10-CM | POA: Diagnosis not present

## 2024-05-11 DIAGNOSIS — G4733 Obstructive sleep apnea (adult) (pediatric): Secondary | ICD-10-CM | POA: Diagnosis not present

## 2024-09-29 ENCOUNTER — Inpatient Hospital Stay

## 2024-10-07 ENCOUNTER — Inpatient Hospital Stay: Admitting: Hematology
